# Patient Record
Sex: Male | Born: 1945 | Race: White | Hispanic: No | Marital: Married | Smoking: Never smoker
Health system: Southern US, Community
[De-identification: ages and names within clinical notes are randomized; demographics above are authoritative.]

## PROBLEM LIST (undated history)

## (undated) DIAGNOSIS — I4891 Unspecified atrial fibrillation: Secondary | ICD-10-CM

## (undated) DIAGNOSIS — I10 Essential (primary) hypertension: Secondary | ICD-10-CM

## (undated) DIAGNOSIS — I499 Cardiac arrhythmia, unspecified: Secondary | ICD-10-CM

## (undated) DIAGNOSIS — C801 Malignant (primary) neoplasm, unspecified: Secondary | ICD-10-CM

## (undated) DIAGNOSIS — T8859XA Other complications of anesthesia, initial encounter: Secondary | ICD-10-CM

## (undated) DIAGNOSIS — E785 Hyperlipidemia, unspecified: Secondary | ICD-10-CM

## (undated) DIAGNOSIS — M1990 Unspecified osteoarthritis, unspecified site: Secondary | ICD-10-CM

## (undated) HISTORY — DX: Unspecified osteoarthritis, unspecified site: M19.90

## (undated) HISTORY — DX: Essential (primary) hypertension: I10

## (undated) HISTORY — PX: KNEE ARTHROSCOPY: SHX127

## (undated) HISTORY — PX: HERNIA REPAIR: SHX51

## (undated) HISTORY — DX: Hyperlipidemia, unspecified: E78.5

---

## 2002-06-26 ENCOUNTER — Ambulatory Visit (HOSPITAL_COMMUNITY): Admission: RE | Admit: 2002-06-26 | Discharge: 2002-06-26 | Payer: Self-pay | Admitting: Gastroenterology

## 2003-12-29 ENCOUNTER — Encounter: Admission: RE | Admit: 2003-12-29 | Discharge: 2003-12-29 | Payer: Self-pay | Admitting: General Surgery

## 2004-01-01 ENCOUNTER — Ambulatory Visit (HOSPITAL_COMMUNITY): Admission: RE | Admit: 2004-01-01 | Discharge: 2004-01-01 | Payer: Self-pay | Admitting: General Surgery

## 2004-01-01 ENCOUNTER — Ambulatory Visit (HOSPITAL_BASED_OUTPATIENT_CLINIC_OR_DEPARTMENT_OTHER): Admission: RE | Admit: 2004-01-01 | Discharge: 2004-01-01 | Payer: Self-pay | Admitting: General Surgery

## 2004-09-14 ENCOUNTER — Encounter: Admission: RE | Admit: 2004-09-14 | Discharge: 2004-09-14 | Payer: Self-pay | Admitting: General Surgery

## 2004-10-17 ENCOUNTER — Encounter: Admission: RE | Admit: 2004-10-17 | Discharge: 2004-10-17 | Payer: Self-pay | Admitting: General Surgery

## 2004-10-19 ENCOUNTER — Ambulatory Visit (HOSPITAL_BASED_OUTPATIENT_CLINIC_OR_DEPARTMENT_OTHER): Admission: RE | Admit: 2004-10-19 | Discharge: 2004-10-19 | Payer: Self-pay | Admitting: General Surgery

## 2004-10-19 ENCOUNTER — Ambulatory Visit (HOSPITAL_COMMUNITY): Admission: RE | Admit: 2004-10-19 | Discharge: 2004-10-19 | Payer: Self-pay | Admitting: General Surgery

## 2012-10-29 ENCOUNTER — Other Ambulatory Visit: Payer: Self-pay | Admitting: Gastroenterology

## 2017-07-11 ENCOUNTER — Other Ambulatory Visit: Payer: Self-pay | Admitting: Urology

## 2017-07-11 DIAGNOSIS — C61 Malignant neoplasm of prostate: Secondary | ICD-10-CM

## 2017-07-18 ENCOUNTER — Other Ambulatory Visit: Payer: Self-pay | Admitting: Physician Assistant

## 2017-07-18 DIAGNOSIS — M8580 Other specified disorders of bone density and structure, unspecified site: Secondary | ICD-10-CM

## 2017-07-24 ENCOUNTER — Ambulatory Visit
Admission: RE | Admit: 2017-07-24 | Discharge: 2017-07-24 | Disposition: A | Discharge status: Home / Self Care | Payer: Medicare Other | Source: Ambulatory Visit | Attending: Urology | Admitting: Urology

## 2017-07-24 DIAGNOSIS — C61 Malignant neoplasm of prostate: Secondary | ICD-10-CM

## 2017-07-24 MED ORDER — GADOBENATE DIMEGLUMINE 529 MG/ML IV SOLN
16.0000 mL | Freq: Once | INTRAVENOUS | Status: AC | PRN
  Administered 2017-07-24: 16 mL via INTRAVENOUS

## 2017-08-10 ENCOUNTER — Ambulatory Visit
Admission: RE | Admit: 2017-08-10 | Discharge: 2017-08-10 | Disposition: A | Discharge status: Home / Self Care | Payer: Medicare Other | Source: Ambulatory Visit | Attending: Physician Assistant | Admitting: Physician Assistant

## 2017-08-10 DIAGNOSIS — M8580 Other specified disorders of bone density and structure, unspecified site: Secondary | ICD-10-CM

## 2018-05-29 ENCOUNTER — Ambulatory Visit: Payer: Self-pay | Admitting: Family Medicine

## 2018-05-30 ENCOUNTER — Encounter: Payer: Self-pay | Admitting: Family Medicine

## 2018-05-30 ENCOUNTER — Ambulatory Visit (INDEPENDENT_AMBULATORY_CARE_PROVIDER_SITE_OTHER): Payer: Medicare Other | Admitting: Family Medicine

## 2018-05-30 VITALS — BP 118/74 | HR 78 | Temp 97.4°F | Ht 68.0 in | Wt 177.0 lb

## 2018-05-30 DIAGNOSIS — M810 Age-related osteoporosis without current pathological fracture: Secondary | ICD-10-CM | POA: Insufficient documentation

## 2018-05-30 DIAGNOSIS — M818 Other osteoporosis without current pathological fracture: Secondary | ICD-10-CM

## 2018-05-30 DIAGNOSIS — Z23 Encounter for immunization: Secondary | ICD-10-CM | POA: Diagnosis not present

## 2018-05-30 DIAGNOSIS — B372 Candidiasis of skin and nail: Secondary | ICD-10-CM

## 2018-05-30 DIAGNOSIS — E785 Hyperlipidemia, unspecified: Secondary | ICD-10-CM | POA: Insufficient documentation

## 2018-05-30 DIAGNOSIS — E782 Mixed hyperlipidemia: Secondary | ICD-10-CM | POA: Diagnosis not present

## 2018-05-30 DIAGNOSIS — R972 Elevated prostate specific antigen [PSA]: Secondary | ICD-10-CM

## 2018-05-30 DIAGNOSIS — M353 Polymyalgia rheumatica: Secondary | ICD-10-CM | POA: Diagnosis not present

## 2018-05-30 DIAGNOSIS — I10 Essential (primary) hypertension: Secondary | ICD-10-CM

## 2018-05-30 LAB — CMP14+EGFR
ALK PHOS: 42 IU/L (ref 39–117)
ALT: 10 IU/L (ref 0–44)
AST: 16 IU/L (ref 0–40)
Albumin/Globulin Ratio: 2 (ref 1.2–2.2)
Albumin: 4.4 g/dL (ref 3.5–4.8)
BUN/Creatinine Ratio: 25 — ABNORMAL HIGH (ref 10–24)
BUN: 20 mg/dL (ref 8–27)
Bilirubin Total: 0.6 mg/dL (ref 0.0–1.2)
CALCIUM: 9.4 mg/dL (ref 8.6–10.2)
CHLORIDE: 100 mmol/L (ref 96–106)
CO2: 25 mmol/L (ref 20–29)
CREATININE: 0.8 mg/dL (ref 0.76–1.27)
GFR calc Af Amer: 103 mL/min/{1.73_m2} (ref >59)
GFR calc non Af Amer: 89 mL/min/{1.73_m2} (ref >59)
Globulin, Total: 2.2 g/dL (ref 1.5–4.5)
Glucose: 85 mg/dL (ref 65–99)
Potassium: 4 mmol/L (ref 3.5–5.2)
Sodium: 141 mmol/L (ref 134–144)
TOTAL PROTEIN: 6.6 g/dL (ref 6.0–8.5)

## 2018-05-30 LAB — CBC WITH DIFFERENTIAL/PLATELET
Basophils Absolute: 0 10*3/uL (ref 0.0–0.2)
Basos: 0 %
EOS (ABSOLUTE): 0.2 10*3/uL (ref 0.0–0.4)
EOS: 4 %
HEMATOCRIT: 40.7 % (ref 37.5–51.0)
HEMOGLOBIN: 14.7 g/dL (ref 13.0–17.7)
Immature Grans (Abs): 0 10*3/uL (ref 0.0–0.1)
Immature Granulocytes: 0 %
Lymphocytes Absolute: 1.8 10*3/uL (ref 0.7–3.1)
Lymphs: 28 %
MCH: 32.5 pg (ref 26.6–33.0)
MCHC: 36.1 g/dL — ABNORMAL HIGH (ref 31.5–35.7)
MCV: 90 fL (ref 79–97)
MONOS ABS: 0.7 10*3/uL (ref 0.1–0.9)
Monocytes: 10 %
NEUTROS ABS: 3.7 10*3/uL (ref 1.4–7.0)
Neutrophils: 58 %
Platelets: 243 10*3/uL (ref 150–450)
RBC: 4.52 x10E6/uL (ref 4.14–5.80)
RDW: 12.7 % (ref 12.3–15.4)
WBC: 6.4 10*3/uL (ref 3.4–10.8)

## 2018-05-30 LAB — LIPID PANEL
CHOLESTEROL TOTAL: 164 mg/dL (ref 100–199)
Chol/HDL Ratio: 3.2 ratio (ref 0.0–5.0)
HDL: 52 mg/dL (ref >39)
LDL CALC: 86 mg/dL (ref 0–99)
TRIGLYCERIDES: 130 mg/dL (ref 0–149)
VLDL CHOLESTEROL CAL: 26 mg/dL (ref 5–40)

## 2018-05-30 MED ORDER — TERBINAFINE HCL 1 % EX CREA: 1.0000 "application " | TOPICAL_CREAM | Freq: Two times a day (BID) | CUTANEOUS | 0 refills | Status: DC

## 2018-05-30 MED ORDER — FLUCONAZOLE 150 MG PO TABS: 150.0000 mg | ORAL_TABLET | Freq: Once | ORAL | 0 refills | Status: AC

## 2018-05-30 NOTE — Progress Notes (Signed)
BP 118/74   Pulse 78   Temp (!) 97.4 F (36.3 C) (Oral)   Ht _0  (1.727 m)   Wt 177 lb (80.3 kg)   BMI 26.91 kg/m    Subjective:    Patient ID: David Schwartz, male    DOB: 11/24/1945, 72 y.o.   MRN: [id number]  HPI: David Schwartz is a 72 y.o. male presenting on 05/30/2018 for New Patient (Initial Visit) [facility] in [place]); Establish Care; and Rash (Groin - x 1-2  months )   HPI Hypertension Patient is currently on Benicar, and their blood pressure today is 118/74. Patient denies any lightheadedness or dizziness. Patient denies headaches, blurred vision, chest pains, shortness of breath, or weakness. Denies any side effects from medication and is content with current medication.   Hyperlipidemia Patient is coming in for recheck of his hyperlipidemia. The patient is currently taking Crestor. They deny any issues with myalgias or history of liver damage from it. They deny any focal numbness or weakness or chest pain.   Rash in groin Patient is coming in with complaints of a rash in his groin mainly on the left side of his groin near his left testicle that has been irritating.  He has been using ketoconazole cream on it and it seemed to help a little bit but will not clear it.  Still has been hot and humid from the summer.  He says is very pruritic but not painful.  He denies any drainage or redness or warmth.  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  He is currently taking Fosamax for osteoporosis.  Patient also has enlarged prostate and elevated PSA which he has been seeing a urologist for at [facility] and they have been monitoring it and have been doing biopsies as needed.  Relevant past medical, surgical, family and social history reviewed and updated as indicated. Interim  medical history since our last visit reviewed. Allergies and medications reviewed and updated.  Review of Systems  Constitutional: Negative for chills and fever.  Eyes: Negative for visual disturbance.  Respiratory: Negative for shortness of breath and wheezing.   Cardiovascular: Negative for chest pain and leg swelling.  Genitourinary: Negative for difficulty urinating.  Musculoskeletal: Positive for arthralgias. Negative for back pain and gait problem.  Skin: Positive for rash.  Neurological: Negative for dizziness, weakness and light-headedness.  All other systems reviewed and are negative.   Per HPI unless specifically indicated above  Social History   Socioeconomic History  . Marital status: Single    Spouse name: Not on file  . Number of children: Not on file  . Years of education: Not on file  . Highest education level: Not on file  Occupational History  . Not on file  Social Needs  . Financial resource strain: Not on file  . Food insecurity:    Worry: Not on file    Inability: Not on file  . Transportation needs:    Medical: Not on file    Non-medical: Not on file  Tobacco Use  . Smoking status: Never Smoker  . Smokeless tobacco: Never Used  Substance and Sexual Activity  . Alcohol use: Yes    Comment: occ  . Drug use: Never  . Sexual activity: Not on file  Lifestyle  .  Physical activity:    Days per week: Not on file    Minutes per session: Not on file  . Stress: Not on file  Relationships  . Social connections:    Talks on phone: Not on file    Gets together: Not on file    Attends religious service: Not on file    Active member of club or organization: Not on file    Attends meetings of clubs or organizations: Not on file    Relationship status: Not on file  . Intimate partner violence:    Fear of current or ex partner: Not on file    Emotionally abused: Not on file    Physically abused: Not on file    Forced sexual activity: Not on file  Other  Topics Concern  . Not on file  Social History Narrative  . Not on file    Past Surgical History:  Procedure Laterality Date  . HERNIA REPAIR    . KNEE ARTHROSCOPY      Family History  Problem Relation Age of Onset  . Diabetes Mother   . Hypertension Mother   . Early death Father   . Arthritis Maternal Grandmother   . Hypertension Maternal Grandfather   . Hypertension Paternal Grandmother   . Cancer Paternal Grandfather        lung    Allergies as of 05/30/2018   No Known Allergies     Medication List        Accurate as of 05/30/18 10:18 AM. Always use your most recent med list.          alendronate 70 MG tablet Commonly known as:  FOSAMAX Take 1 tablet by mouth once a week.   aspirin EC 81 MG tablet Take 81 mg by mouth daily.   calcium carbonate 1500 (600 Ca) MG Tabs tablet Commonly known as:  OSCAL Take by mouth daily with breakfast.   fluconazole 150 MG tablet Commonly known as:  DIFLUCAN Take 1 tablet (150 mg total) by mouth once for 1 dose.   olmesartan-hydrochlorothiazide 20-12.5 MG tablet Commonly known as:  BENICAR HCT Take 1 tablet by mouth daily at 2 PM.   predniSONE 1 MG tablet Commonly known as:  DELTASONE Take 2 mg by mouth daily with breakfast.   rosuvastatin 5 MG tablet Commonly known as:  CRESTOR Take 1 tablet by mouth daily at 2 PM.   terazosin 2 MG capsule Commonly known as:  HYTRIN Take 1 capsule by mouth daily at 2 PM.   terbinafine 1 % cream Commonly known as:  LAMISIL Apply 1 application topically 2 (two) times daily.   Vitamin D3 1000 units Caps Take by mouth.          Objective:    BP 118/74   Pulse 78   Temp (!) 97.4 F (36.3 C) (Oral)   Ht _0  (1.727 m)   Wt 177 lb (80.3 kg)   BMI 26.91 kg/m   Wt Readings from Last 3 Encounters:  05/30/18 177 lb (80.3 kg)    Physical Exam  Constitutional: He is oriented to person, place, and time. He appears well-developed and well-nourished. No distress.  Eyes:  Pupils are equal, round, and reactive to light. Conjunctivae and EOM are normal. No scleral icterus.  Neck: Neck supple. No thyromegaly present.  Cardiovascular: Normal rate, regular rhythm, normal heart sounds and intact distal pulses.  No murmur heard. Pulmonary/Chest: Effort normal and breath sounds normal. No respiratory distress. He has no  wheezes.  Musculoskeletal: Normal range of motion. He exhibits no edema.  Lymphadenopathy:    He has no cervical adenopathy.  Neurological: He is alert and oriented to person, place, and time. Coordination normal.  Skin: Skin is warm and dry. Rash (Yeast dermatitis of left groin) noted. He is not diaphoretic.  Psychiatric: He has a normal mood and affect. His behavior is normal.  Nursing note and vitals reviewed.   No results found for this or any previous visit.    Assessment & Plan:   Problem List Items Addressed This Visit      Cardiovascular and Mediastinum   Hypertension - Primary   Relevant Medications   olmesartan-hydrochlorothiazide (BENICAR HCT) 20-12.5 MG tablet   terazosin (HYTRIN) 2 MG capsule   rosuvastatin (CRESTOR) 5 MG tablet   aspirin EC 81 MG tablet   Other Relevant Orders   CMP14+EGFR     Musculoskeletal and Integument   Osteoporosis   Relevant Medications   alendronate (FOSAMAX) 70 MG tablet   calcium carbonate (OSCAL) 1500 (600 Ca) MG TABS tablet   Cholecalciferol (VITAMIN D3) 1000 units CAPS   Other Relevant Orders   CBC with Differential/Platelet     Other   Hyperlipemia   Relevant Medications   olmesartan-hydrochlorothiazide (BENICAR HCT) 20-12.5 MG tablet   terazosin (HYTRIN) 2 MG capsule   rosuvastatin (CRESTOR) 5 MG tablet   aspirin EC 81 MG tablet   Other Relevant Orders   Lipid panel   PMR (polymyalgia rheumatica) (HCC)   Relevant Orders   CBC with Differential/Platelet   Elevated PSA measurement    Other Visit Diagnoses    Yeast dermatitis       Relevant Medications   fluconazole (DIFLUCAN)  150 MG tablet   terbinafine (LAMISIL AT) 1 % cream       Follow up plan: Return in about 1 year (around 05/31/2019), or if symptoms worsen or fail to improve, for htn and hld.  Caryl Pina, MD [facility] 05/30/2018, 10:18 AM

## 2018-06-13 ENCOUNTER — Ambulatory Visit: Payer: Medicare Other | Admitting: Family Medicine

## 2018-06-13 ENCOUNTER — Encounter: Payer: Self-pay | Admitting: Family Medicine

## 2018-06-13 VITALS — BP 123/79 | HR 79 | Temp 97.4°F | Ht 68.0 in | Wt 179.2 lb

## 2018-06-13 DIAGNOSIS — B372 Candidiasis of skin and nail: Secondary | ICD-10-CM | POA: Diagnosis not present

## 2018-06-13 MED ORDER — FLUCONAZOLE 150 MG PO TABS: 150.0000 mg | ORAL_TABLET | ORAL | 0 refills | Status: DC

## 2018-06-13 MED ORDER — MICONAZOLE NITRATE 2 % EX POWD
CUTANEOUS | 0 refills | Status: DC | PRN
Start: 2018-06-13 — End: 2018-12-09

## 2018-06-13 NOTE — Progress Notes (Signed)
BP 123/79   Pulse 79   Temp (!) 97.4 F (36.3 C) (Oral)   Ht 5\' 8"  (1.727 m)   Wt 179 lb 3.2 oz (81.3 kg)   BMI 27.25 kg/m    Subjective:    Patient ID: David Schwartz, male    DOB: 1946-01-02, 72 y.o.   MRN: [id number]  HPI: David Schwartz is a 72 y.o. male presenting on 06/13/2018 for Rash (Groin- Patient states it has been there a few months - Seen 9/26 and no better)   HPI Rash in groin Patient comes in complaining of rash in groin that has been bothering him for the past few weeks.  He says that he did feel like it did improve a little bit with the Diflucan and then he has been using the jock itch cream which is helped some but it does not seem to be resolving completely  Relevant past medical, surgical, family and social history reviewed and updated as indicated. Interim medical history since our last visit reviewed. Allergies and medications reviewed and updated.  Review of Systems  Constitutional: Negative for chills and fever.  Respiratory: Negative for shortness of breath and wheezing.   Cardiovascular: Negative for chest pain and leg swelling.  Musculoskeletal: Negative for back pain and gait problem.  Skin: Positive for rash.  All other systems reviewed and are negative.   Per HPI unless specifically indicated above   Allergies as of 06/13/2018   No Known Allergies     Medication List        Accurate as of 06/13/18  8:48 AM. Always use your most recent med list.          alendronate 70 MG tablet Commonly known as:  FOSAMAX Take 1 tablet by mouth once a week.   aspirin EC 81 MG tablet Take 81 mg by mouth daily.   calcium carbonate 1500 (600 Ca) MG Tabs tablet Commonly known as:  OSCAL Take by mouth daily with breakfast.   fluconazole 150 MG tablet Commonly known as:  DIFLUCAN Take 1 tablet (150 mg total) by mouth every 3 (three) days. 1 po q week x 4 weeks   miconazole 2 % powder Commonly known as:  MICOTIN Apply topically as needed  for itching.   olmesartan-hydrochlorothiazide 20-12.5 MG tablet Commonly known as:  BENICAR HCT Take 1 tablet by mouth daily at 2 PM.   predniSONE 1 MG tablet Commonly known as:  DELTASONE Take 2 mg by mouth daily with breakfast.   rosuvastatin 5 MG tablet Commonly known as:  CRESTOR Take 1 tablet by mouth daily at 2 PM.   terazosin 2 MG capsule Commonly known as:  HYTRIN Take 1 capsule by mouth daily at 2 PM.   terbinafine 1 % cream Commonly known as:  LAMISIL Apply 1 application topically 2 (two) times daily.   Vitamin D3 1000 units Caps Take by mouth.          Objective:    BP 123/79   Pulse 79   Temp (!) 97.4 F (36.3 C) (Oral)   Ht 5\' 8"  (1.727 m)   Wt 179 lb 3.2 oz (81.3 kg)   BMI 27.25 kg/m   Wt Readings from Last 3 Encounters:  06/13/18 179 lb 3.2 oz (81.3 kg)  05/30/18 177 lb (80.3 kg)    Physical Exam  Constitutional: He is oriented to person, place, and time. He appears well-developed and well-nourished. No distress.  Eyes: Conjunctivae are normal. No scleral icterus.  Cardiovascular: Normal rate, regular rhythm, normal heart sounds and intact distal pulses.  No murmur heard. Pulmonary/Chest: Effort normal and breath sounds normal. No respiratory distress. He has no wheezes.  Neurological: He is alert and oriented to person, place, and time. Coordination normal.  Skin: Skin is warm and dry. Rash (Pink irritated rash with satellite lesions in left groin) noted. He is not diaphoretic.  Psychiatric: He has a normal mood and affect. His behavior is normal.  Nursing note and vitals reviewed.       Assessment & Plan:   Problem List Items Addressed This Visit    None    Visit Diagnoses    Yeast dermatitis    -  Primary   Relevant Medications   miconazole (LOTRIMIN AF) 2 % powder   fluconazole (DIFLUCAN) 150 MG tablet       Follow up plan: Return if symptoms worsen or fail to improve.  Counseling provided for all of the vaccine  components No orders of the defined types were placed in this encounter.   Caryl Pina, MD [facility] 06/13/2018, 8:48 AM

## 2018-06-14 ENCOUNTER — Telehealth: Payer: Self-pay | Admitting: *Deleted

## 2018-06-14 DIAGNOSIS — B372 Candidiasis of skin and nail: Secondary | ICD-10-CM

## 2018-06-14 NOTE — Telephone Encounter (Signed)
Fax from [facility] received with 2 sets of directions Please clarify and resend

## 2018-06-14 NOTE — Telephone Encounter (Signed)
We fixed it, received a call from them.

## 2018-06-25 ENCOUNTER — Telehealth: Payer: Self-pay

## 2018-06-25 DIAGNOSIS — R21 Rash and other nonspecific skin eruption: Secondary | ICD-10-CM

## 2018-06-25 NOTE — Telephone Encounter (Signed)
Wants a referral to Dr Tarri Glenn for a rash

## 2018-06-26 NOTE — Telephone Encounter (Signed)
Go ahead and do referral to Dr. Tarri Glenn for rash

## 2018-06-26 NOTE — Addendum Note (Signed)
Addended by: Nigel Berthold C on: 06/26/2018 11:04 AM   Modules accepted: Orders

## 2018-06-26 NOTE — Telephone Encounter (Signed)
Referral placed - wife aware.

## 2018-07-02 DIAGNOSIS — L280 Lichen simplex chronicus: Secondary | ICD-10-CM | POA: Diagnosis not present

## 2018-07-12 DIAGNOSIS — Z7952 Long term (current) use of systemic steroids: Secondary | ICD-10-CM | POA: Diagnosis not present

## 2018-07-12 DIAGNOSIS — M130 Polyarthritis, unspecified: Secondary | ICD-10-CM | POA: Diagnosis not present

## 2018-07-12 DIAGNOSIS — M353 Polymyalgia rheumatica: Secondary | ICD-10-CM | POA: Diagnosis not present

## 2018-07-12 DIAGNOSIS — M150 Primary generalized (osteo)arthritis: Secondary | ICD-10-CM | POA: Diagnosis not present

## 2018-07-23 DIAGNOSIS — L280 Lichen simplex chronicus: Secondary | ICD-10-CM | POA: Diagnosis not present

## 2018-07-24 ENCOUNTER — Ambulatory Visit: Payer: Medicare Other | Admitting: Urology

## 2018-07-24 DIAGNOSIS — N401 Enlarged prostate with lower urinary tract symptoms: Secondary | ICD-10-CM

## 2018-07-24 DIAGNOSIS — C61 Malignant neoplasm of prostate: Secondary | ICD-10-CM

## 2018-09-11 DIAGNOSIS — Z7952 Long term (current) use of systemic steroids: Secondary | ICD-10-CM | POA: Diagnosis not present

## 2018-09-11 DIAGNOSIS — M353 Polymyalgia rheumatica: Secondary | ICD-10-CM | POA: Diagnosis not present

## 2018-09-11 DIAGNOSIS — M130 Polyarthritis, unspecified: Secondary | ICD-10-CM | POA: Diagnosis not present

## 2018-09-11 DIAGNOSIS — M150 Primary generalized (osteo)arthritis: Secondary | ICD-10-CM | POA: Diagnosis not present

## 2018-09-26 ENCOUNTER — Other Ambulatory Visit: Payer: Self-pay

## 2018-09-26 MED ORDER — OLMESARTAN MEDOXOMIL-HCTZ 20-12.5 MG PO TABS: 1.0000 | ORAL_TABLET | Freq: Every day | ORAL | 1 refills | Status: DC

## 2018-09-30 ENCOUNTER — Telehealth: Payer: Self-pay

## 2018-09-30 NOTE — Telephone Encounter (Signed)
error 

## 2018-11-25 ENCOUNTER — Other Ambulatory Visit: Payer: Self-pay | Admitting: *Deleted

## 2018-11-25 MED ORDER — TERAZOSIN HCL 2 MG PO CAPS: 2.0000 mg | ORAL_CAPSULE | Freq: Every day | ORAL | 2 refills | Status: DC

## 2018-12-09 ENCOUNTER — Ambulatory Visit (INDEPENDENT_AMBULATORY_CARE_PROVIDER_SITE_OTHER): Payer: Medicare Other | Admitting: Family Medicine

## 2018-12-09 ENCOUNTER — Other Ambulatory Visit: Payer: Self-pay

## 2018-12-09 ENCOUNTER — Encounter: Payer: Self-pay | Admitting: Family Medicine

## 2018-12-09 DIAGNOSIS — I10 Essential (primary) hypertension: Secondary | ICD-10-CM | POA: Diagnosis not present

## 2018-12-09 DIAGNOSIS — E782 Mixed hyperlipidemia: Secondary | ICD-10-CM

## 2018-12-09 MED ORDER — OLMESARTAN MEDOXOMIL-HCTZ 20-12.5 MG PO TABS: 1.0000 | ORAL_TABLET | Freq: Every day | ORAL | 3 refills | Status: DC

## 2018-12-09 MED ORDER — TERAZOSIN HCL 2 MG PO CAPS: 2.0000 mg | ORAL_CAPSULE | Freq: Every day | ORAL | 3 refills | Status: DC

## 2018-12-09 MED ORDER — ROSUVASTATIN CALCIUM 5 MG PO TABS: 5.0000 mg | ORAL_TABLET | Freq: Every day | ORAL | 3 refills | Status: DC

## 2018-12-09 NOTE — Progress Notes (Signed)
Virtual Visit via telephone Note  I connected with David Schwartz on 12/09/18 at 1440 by telephone and verified that I am speaking with the correct person using two identifiers. David Schwartz is currently located at home and no other people are currently with her during visit. The provider, Fransisca Kaufmann Bee Hammerschmidt, MD is located in their office at time of visit.  Call ended at 1450  I discussed the limitations, risks, security and privacy concerns of performing an evaluation and management service by telephone and the availability of in person appointments. I also discussed with the patient that there may be a patient responsible charge related to this service. The patient expressed understanding and agreed to proceed.   History and Present Illness: Hypertension Patient is currently on benicar hct, and their blood pressure today is 120/70s at home. Patient denies any lightheadedness or dizziness. Patient denies headaches, blurred vision, chest pains, shortness of breath, or weakness. Denies any side effects from medication and is content with current medication.   Hyperlipidemia Patient is coming in for recheck of his hyperlipidemia. The patient is currently taking crestor. They deny any issues with myalgias or history of liver damage from it. They deny any focal numbness or weakness or chest pain.   No diagnosis found.  Outpatient Encounter Medications as of 12/09/2018  Medication Sig  . alendronate (FOSAMAX) 70 MG tablet Take 1 tablet by mouth once a week.  Marland Kitchen aspirin EC 81 MG tablet Take 81 mg by mouth daily.  . calcium carbonate (OSCAL) 1500 (600 Ca) MG TABS tablet Take by mouth daily with breakfast.  . Cholecalciferol (VITAMIN D3) 1000 units CAPS Take by mouth.  . fluconazole (DIFLUCAN) 150 MG tablet Take 1 tablet (150 mg total) by mouth every 3 (three) days. 1 po q week x 4 weeks  . miconazole (LOTRIMIN AF) 2 % powder Apply topically as needed for itching.  .  olmesartan-hydrochlorothiazide (BENICAR HCT) 20-12.5 MG tablet Take 1 tablet by mouth daily at 2 PM.  . predniSONE (DELTASONE) 1 MG tablet Take 2 mg by mouth daily with breakfast.  . rosuvastatin (CRESTOR) 5 MG tablet Take 1 tablet by mouth daily at 2 PM.  . terazosin (HYTRIN) 2 MG capsule Take 1 capsule (2 mg total) by mouth daily at 2 PM.  . terbinafine (LAMISIL AT) 1 % cream Apply 1 application topically 2 (two) times daily.   No facility-administered encounter medications on file as of 12/09/2018.     Review of Systems  Constitutional: Negative for chills and fever.  Eyes: Negative for visual disturbance.  Respiratory: Negative for shortness of breath and wheezing.   Cardiovascular: Negative for chest pain and leg swelling.  Musculoskeletal: Negative for back pain and gait problem.  Skin: Negative for rash.  Neurological: Negative for dizziness, weakness, light-headedness and headaches.  All other systems reviewed and are negative.   Observations/Objective: Patient sounds comfortable on the phone and in no acute distress  Assessment and Plan: Problem List Items Addressed This Visit      Cardiovascular and Mediastinum   Hypertension - Primary   Relevant Medications   olmesartan-hydrochlorothiazide (BENICAR HCT) 20-12.5 MG tablet   terazosin (HYTRIN) 2 MG capsule   rosuvastatin (CRESTOR) 5 MG tablet     Other   Hyperlipemia   Relevant Medications   olmesartan-hydrochlorothiazide (BENICAR HCT) 20-12.5 MG tablet   terazosin (HYTRIN) 2 MG capsule   rosuvastatin (CRESTOR) 5 MG tablet       Follow Up Instructions:  Follow up  in 6 months for labs and htn, he says home blood pressures are running very well   I discussed the assessment and treatment plan with the patient. The patient was provided an opportunity to ask questions and all were answered. The patient agreed with the plan and demonstrated an understanding of the instructions.   The patient was advised to call back  or seek an in-person evaluation if the symptoms worsen or if the condition fails to improve as anticipated.  The above assessment and management plan was discussed with the patient. The patient verbalized understanding of and has agreed to the management plan. Patient is aware to call the clinic if symptoms persist or worsen. Patient is aware when to return to the clinic for a follow-up visit. Patient educated on when it is appropriate to go to the emergency department.    I provided 10 minutes of non-face-to-face time during this encounter.    Worthy Rancher, MD

## 2019-01-07 ENCOUNTER — Telehealth: Payer: Self-pay | Admitting: Family Medicine

## 2019-01-07 MED ORDER — ALENDRONATE SODIUM 70 MG PO TABS: 70.0000 mg | ORAL_TABLET | ORAL | 3 refills | Status: DC

## 2019-01-07 NOTE — Telephone Encounter (Signed)
Med sent in / aware

## 2019-01-07 NOTE — Telephone Encounter (Signed)
What is the name of the medication? Alendronate 70MG  was prescribed by rheumatologist but has discussed this with Dr. Keturah Barre and was told he would refill it   Have you contacted your pharmacy to request a refill? yes  Which pharmacy would you like this sent to? [facility]   Patient notified that their request is being sent to the clinical staff for review and that they should receive a call once it is complete. If they do not receive a call within 24 hours they can check with their pharmacy or our office.

## 2019-02-05 ENCOUNTER — Ambulatory Visit (INDEPENDENT_AMBULATORY_CARE_PROVIDER_SITE_OTHER): Payer: Medicare Other | Admitting: Urology

## 2019-02-05 DIAGNOSIS — N401 Enlarged prostate with lower urinary tract symptoms: Secondary | ICD-10-CM

## 2019-02-05 DIAGNOSIS — N5201 Erectile dysfunction due to arterial insufficiency: Secondary | ICD-10-CM

## 2019-02-05 DIAGNOSIS — C61 Malignant neoplasm of prostate: Secondary | ICD-10-CM | POA: Diagnosis not present

## 2019-02-25 IMAGING — MR MR PROSTATE WO/W CM
56 series · 56 of 56 positions shown · IV contrast (multihance)
Comparison: None.

CLINICAL DATA: Prostate cancer, elevated PSA

EXAM:
MR PROSTATE WITHOUT AND WITH CONTRAST
TECHNIQUE: Multiplanar multisequence MRI images were obtained of the pelvis
centered about the prostate. Pre and post contrast images were
obtained.
CONTRAST:  16mL MULTIHANCE GADOBENATE DIMEGLUMINE 529 MG/ML IV SOLN
Creatinine was obtained on site at [HOSPITAL] at [HOSPITAL].
Results: Creatinine 0.7 mg/dL.

[Series 4: T1 · axial · 8.0mm · 1.06mm/px · 1 of 28 slices shown (1 of 2)]
[im 1/28]
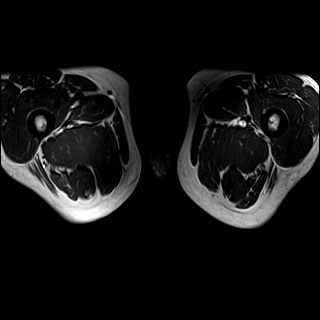

[Series 5: bSSFP fat-sat · axial · 8.0mm · 0.74mm/px · 1 of 28 slices shown]
[im 1/28]
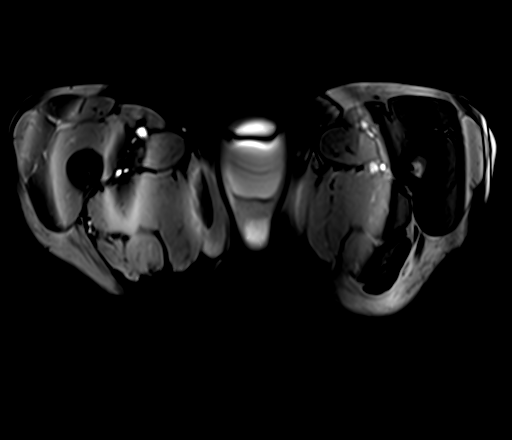

[Series 6: T2 · sagittal · 3.5mm · 0.56mm/px · 1 of 39 slices shown (1 of 4)]
[im 1/39]
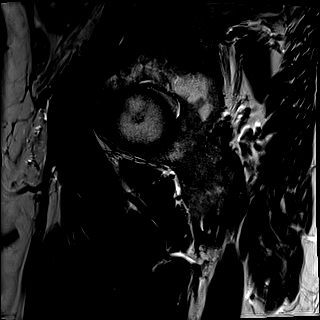

[Series 7: T1 · axial · 3.0mm · 0.31mm/px · 1 of 24 slices shown (2 of 2)]
[im 1/24]
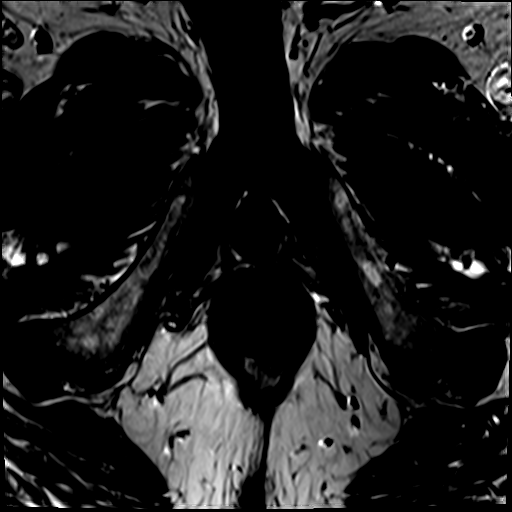

[Series 8: T2 · axial · 3.5mm · 0.56mm/px · 1 of 23 slices shown (2 of 4)]
[im 1/23]
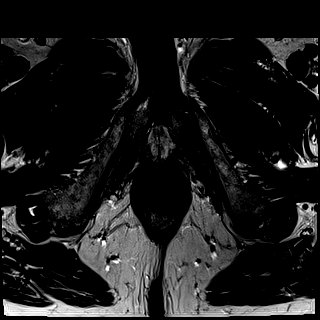

[Series 9: T2 · axial · 1.0mm · 1.04mm/px · 1 of 80 slices shown (3 of 4)]
[im 1/80]
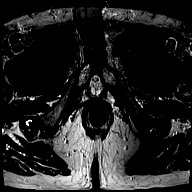

[Series 10: T2 · coronal · 3.5mm · 0.56mm/px · 1 of 23 slices shown (4 of 4)]
[im 1/23]
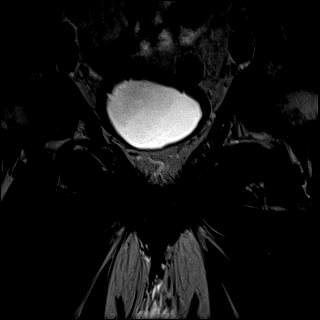

[Series 11: DWI · axial · 3.5mm · 1.56mm/px · 1 of 60 slices shown (1 of 2)]
[im 1/60]
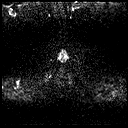

[Series 12: DWI · axial · 3.5mm · 1.56mm/px · 1 of 19 slices shown (2 of 2)]
[im 1/19]
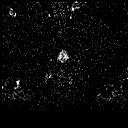

[Series 13: pre t1_twist_tra_dyn_ttc=5.3s · axial · non-contrast · 3.5mm · 0.83mm/px · 1 of 20 slices shown]
[im 1/20]
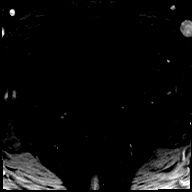

[Series 14: post t1_twist_tra_dyn-copy center · axial · 3.5mm · 0.83mm/px · 1 of 20 slices shown (1 of 24)]
[im 1/20]
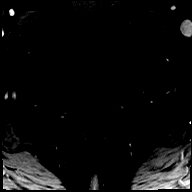

[Series 15: post t1_twist_tra_dyn-copy center · axial · 3.5mm · 0.83mm/px · 1 of 20 slices shown (2 of 24)]
[im 1/20]
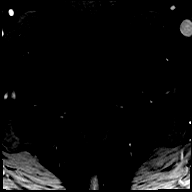

[Series 16: post t1_twist_tra_dyn-copy cent_sub_ttc=23.0s · axial · 3.5mm · 0.83mm/px · 1 of 20 slices shown]
[im 1/20]
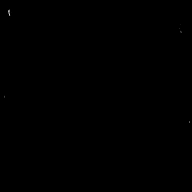

[Series 17: post t1_twist_tra_dyn-copy center · axial · 3.5mm · 0.83mm/px · 1 of 20 slices shown (3 of 24)]
[im 1/20]
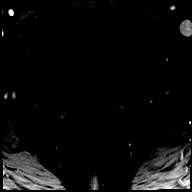

[Series 18: post t1_twist_tra_dyn-copy cent_sub_ttc=33.2s · axial · 3.5mm · 0.83mm/px · 1 of 20 slices shown]
[im 1/20]
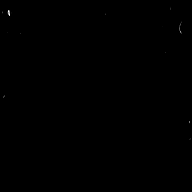

[Series 19: post t1_twist_tra_dyn-copy center · axial · 3.5mm · 0.83mm/px · 1 of 20 slices shown (4 of 24)]
[im 1/20]
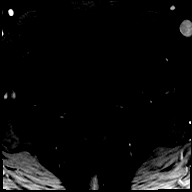

[Series 20: post t1_twist_tra_dyn-copy cent_sub_ttc=43.4s · axial · 3.5mm · 0.83mm/px · 1 of 20 slices shown]
[im 1/20]
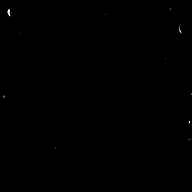

[Series 21: post t1_twist_tra_dyn-copy center · axial · 3.5mm · 0.83mm/px · 1 of 20 slices shown (5 of 24)]
[im 1/20]
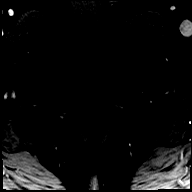

[Series 22: post t1_twist_tra_dyn-copy cent_sub_ttc=53.6s · axial · 3.5mm · 0.83mm/px · 1 of 20 slices shown]
[im 1/20]
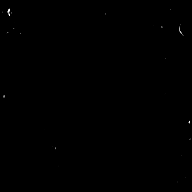

[Series 23: post t1_twist_tra_dyn-copy center · axial · 3.5mm · 0.83mm/px · 1 of 20 slices shown (6 of 24)]
[im 1/20]
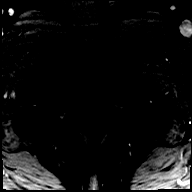

[Series 24: post t1_twist_tra_dyn-copy cent_sub_ttc=63.8s · axial · 3.5mm · 0.83mm/px · 1 of 20 slices shown]
[im 1/20]
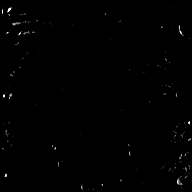

[Series 25: post t1_twist_tra_dyn-copy center · axial · 3.5mm · 0.83mm/px · 1 of 20 slices shown (7 of 24)]
[im 1/20]
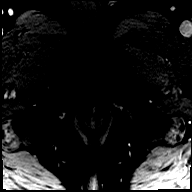

[Series 26: post t1_twist_tra_dyn-copy cent_sub_ttc=74.0s · axial · 3.5mm · 0.83mm/px · 1 of 20 slices shown]
[im 1/20]
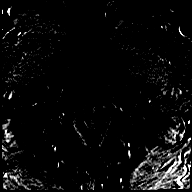

[Series 27: post t1_twist_tra_dyn-copy center · axial · 3.5mm · 0.83mm/px · 1 of 20 slices shown (8 of 24)]
[im 1/20]
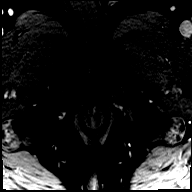

[Series 28: post t1_twist_tra_dyn-copy cent_sub_ttc=84.2s · axial · 3.5mm · 0.83mm/px · 1 of 20 slices shown]
[im 1/20]
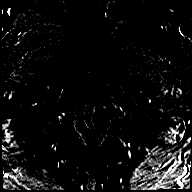

[Series 29: post t1_twist_tra_dyn-copy center · axial · 3.5mm · 0.83mm/px · 1 of 20 slices shown (9 of 24)]
[im 1/20]
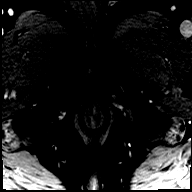

[Series 30: post t1_twist_tra_dyn-copy cent_sub_ttc=94.4s · axial · 3.5mm · 0.83mm/px · 1 of 20 slices shown]
[im 1/20]
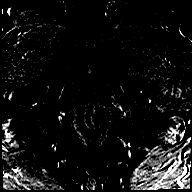

[Series 31: post t1_twist_tra_dyn-copy center · axial · 3.5mm · 0.83mm/px · 1 of 20 slices shown (10 of 24)]
[im 1/20]
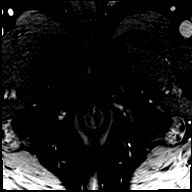

[Series 32: post t1_twist_tra_dyn-copy cent_sub_ttc=104.7s · axial · 3.5mm · 0.83mm/px · 1 of 20 slices shown]
[im 1/20]
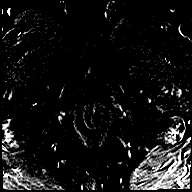

[Series 33: post t1_twist_tra_dyn-copy center · axial · 3.5mm · 0.83mm/px · 1 of 20 slices shown (11 of 24)]
[im 1/20]
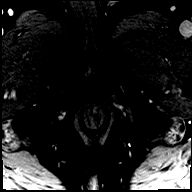

[Series 34: post t1_twist_tra_dyn-copy cent_sub_ttc=114.9s · axial · 3.5mm · 0.83mm/px · 1 of 20 slices shown]
[im 1/20]
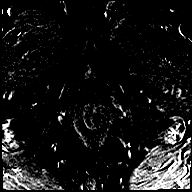

[Series 35: post t1_twist_tra_dyn-copy center · axial · 3.5mm · 0.83mm/px · 1 of 20 slices shown (12 of 24)]
[im 1/20]
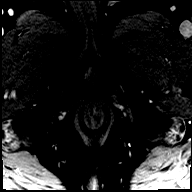

[Series 36: post t1_twist_tra_dyn-copy cent_sub_ttc=125.1s · axial · 3.5mm · 0.83mm/px · 1 of 20 slices shown]
[im 1/20]
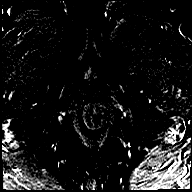

[Series 37: post t1_twist_tra_dyn-copy center · axial · 3.5mm · 0.83mm/px · 1 of 20 slices shown (13 of 24)]
[im 1/20]
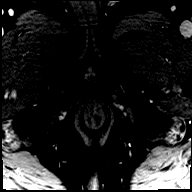

[Series 38: post t1_twist_tra_dyn-copy cent_sub_ttc=135.3s · axial · 3.5mm · 0.83mm/px · 1 of 20 slices shown]
[im 1/20]
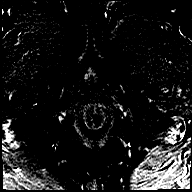

[Series 39: post t1_twist_tra_dyn-copy center · axial · 3.5mm · 0.83mm/px · 1 of 20 slices shown (14 of 24)]
[im 1/20]
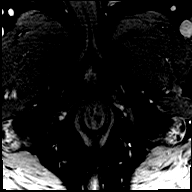

[Series 40: post t1_twist_tra_dyn-copy cent_sub_ttc=145.5s · axial · 3.5mm · 0.83mm/px · 1 of 20 slices shown]
[im 1/20]
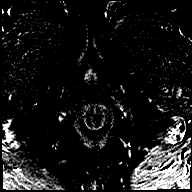

[Series 41: post t1_twist_tra_dyn-copy center · axial · 3.5mm · 0.83mm/px · 1 of 20 slices shown (15 of 24)]
[im 1/20]
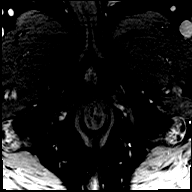

[Series 42: post t1_twist_tra_dyn-copy cent_sub_ttc=155.7s · axial · 3.5mm · 0.83mm/px · 1 of 20 slices shown]
[im 1/20]
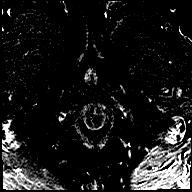

[Series 43: post t1_twist_tra_dyn-copy center · axial · 3.5mm · 0.83mm/px · 1 of 20 slices shown (16 of 24)]
[im 1/20]
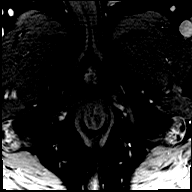

[Series 44: post t1_twist_tra_dyn-copy cent_sub_ttc=165.9s · axial · 3.5mm · 0.83mm/px · 1 of 20 slices shown]
[im 1/20]
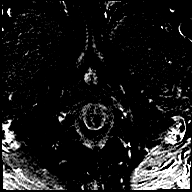

[Series 45: post t1_twist_tra_dyn-copy center · axial · 3.5mm · 0.83mm/px · 1 of 20 slices shown (17 of 24)]
[im 1/20]
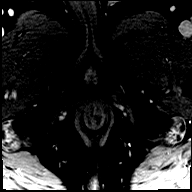

[Series 46: post t1_twist_tra_dyn-copy cent_sub_ttc=176.1s · axial · 3.5mm · 0.83mm/px · 1 of 20 slices shown]
[im 1/20]
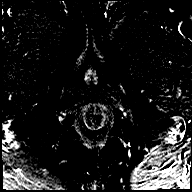

[Series 47: post t1_twist_tra_dyn-copy center · axial · 3.5mm · 0.83mm/px · 1 of 20 slices shown (18 of 24)]
[im 1/20]
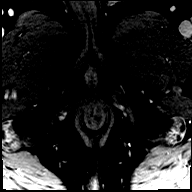

[Series 48: post t1_twist_tra_dyn-copy cent_sub_ttc=186.3s · axial · 3.5mm · 0.83mm/px · 1 of 20 slices shown]
[im 1/20]
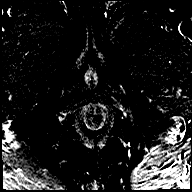

[Series 49: post t1_twist_tra_dyn-copy center · axial · 3.5mm · 0.83mm/px · 1 of 20 slices shown (19 of 24)]
[im 1/20]
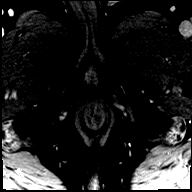

[Series 50: post t1_twist_tra_dyn-copy cent_sub_ttc=196.5s · axial · 3.5mm · 0.83mm/px · 1 of 20 slices shown]
[im 1/20]
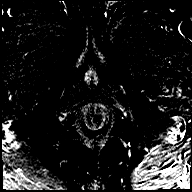

[Series 51: post t1_twist_tra_dyn-copy center · axial · 3.5mm · 0.83mm/px · 1 of 20 slices shown (20 of 24)]
[im 1/20]
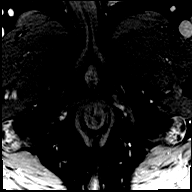

[Series 52: post t1_twist_tra_dyn-copy cent_sub_ttc=206.7s · axial · 3.5mm · 0.83mm/px · 1 of 20 slices shown]
[im 1/20]
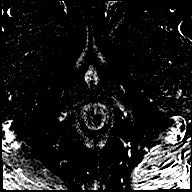

[Series 53: post t1_twist_tra_dyn-copy center · axial · 3.5mm · 0.83mm/px · 1 of 20 slices shown (21 of 24)]
[im 1/20]
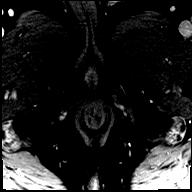

[Series 54: post t1_twist_tra_dyn-copy cent_sub_ttc=216.9s · axial · 3.5mm · 0.83mm/px · 1 of 20 slices shown]
[im 1/20]
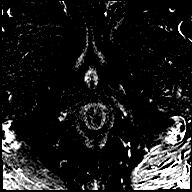

[Series 55: post t1_twist_tra_dyn-copy center · axial · 3.5mm · 0.83mm/px · 1 of 20 slices shown (22 of 24)]
[im 1/20]
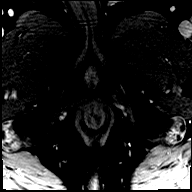

[Series 56: post t1_twist_tra_dyn-copy cent_sub_ttc=227.1s · axial · 3.5mm · 0.83mm/px · 1 of 20 slices shown]
[im 1/20]
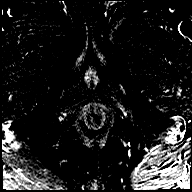

[Series 57: post t1_twist_tra_dyn-copy center · axial · 3.5mm · 0.83mm/px · 1 of 20 slices shown (23 of 24)]
[im 1/20]
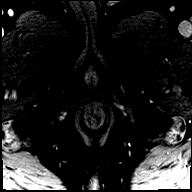

[Series 58: post t1_twist_tra_dyn-copy cent_sub_ttc=237.3s · axial · 3.5mm · 0.83mm/px · 1 of 20 slices shown]
[im 1/20]
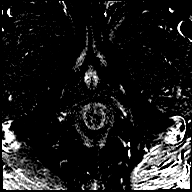

[Series 59: post t1_twist_tra_dyn-copy center · axial · 3.5mm · 0.83mm/px · 1 of 20 slices shown (24 of 24)]
[im 1/20]
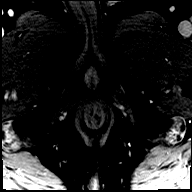

[56 of 56 positions shown; findings below may reference images not displayed]

FINDINGS: Prostate: No discrete/focal low T2 lesion within the central gland.
No early arterial enhancement. No restricted diffusion.

Mild nodularity of the central gland, which indents the base of the
bladder. No suspicious central gland nodule on T2.

Volume: 3.7 x 5.6 x 3.2 cm (volume = 35 mL)

Transcapsular spread:  Absent.

Seminal vesicle involvement: Absent.

Neurovascular bundle involvement: Absent.

Pelvic adenopathy: Absent.

Bone metastasis: Absent.

Other findings: Bladder is mildly thick-walled although
underdistended.

Sigmoid diverticulosis.
IMPRESSION: No findings suspicious for high-grade macroscopic prostate cancer on
MRI. PI-RADS 1.

## 2019-06-02 ENCOUNTER — Encounter: Payer: Medicare Other | Admitting: Family Medicine

## 2019-06-26 ENCOUNTER — Encounter: Payer: Self-pay | Admitting: Family Medicine

## 2019-06-26 ENCOUNTER — Ambulatory Visit (INDEPENDENT_AMBULATORY_CARE_PROVIDER_SITE_OTHER): Payer: Medicare Other | Admitting: Family Medicine

## 2019-06-26 ENCOUNTER — Other Ambulatory Visit: Payer: Self-pay

## 2019-06-26 VITALS — BP 108/66 | HR 82 | Temp 98.2°F | Ht 68.0 in | Wt 174.8 lb

## 2019-06-26 DIAGNOSIS — Z0000 Encounter for general adult medical examination without abnormal findings: Secondary | ICD-10-CM | POA: Diagnosis not present

## 2019-06-26 DIAGNOSIS — I10 Essential (primary) hypertension: Secondary | ICD-10-CM | POA: Diagnosis not present

## 2019-06-26 DIAGNOSIS — B351 Tinea unguium: Secondary | ICD-10-CM

## 2019-06-26 DIAGNOSIS — E782 Mixed hyperlipidemia: Secondary | ICD-10-CM

## 2019-06-26 DIAGNOSIS — Z0001 Encounter for general adult medical examination with abnormal findings: Secondary | ICD-10-CM

## 2019-06-26 DIAGNOSIS — Z1159 Encounter for screening for other viral diseases: Secondary | ICD-10-CM

## 2019-06-26 DIAGNOSIS — L723 Sebaceous cyst: Secondary | ICD-10-CM

## 2019-06-26 MED ORDER — TERBINAFINE HCL 250 MG PO TABS: 250.0000 mg | ORAL_TABLET | Freq: Every day | ORAL | 1 refills | Status: DC

## 2019-06-26 NOTE — Progress Notes (Signed)
BP 108/66   Pulse 82   Temp 98.2 F (36.8 C) (Temporal)   Ht '5\' 8"'$  (1.727 m)   Wt 174 lb 12.8 oz (79.3 kg)   SpO2 98%   BMI 26.58 kg/m    Subjective:   Patient ID: David Schwartz, male    DOB: Jan 24, 1946, 73 y.o.   MRN: [id number]  HPI: David Schwartz is a 73 y.o. male presenting on 06/26/2019 for Annual Exam   HPI Annual exam and recheck of chronic issues Patient is coming in for annual exam and recheck of his chronic medical issues.  He denies any major health issues or concerns except for one spot on his back on the upper part irritated that he has noticed recently over the past couple months that he wants to get that checked out.  He also says that he has been having some toenail fungus on both of his big toes and the second toe on the right side and he wants to know what he can do for that.  He has tried some topical antifungals without much success.  Hypertension Patient is currently on olmesartan-hydrochlorothiazide, and their blood pressure today is 108/66. Patient denies any lightheadedness or dizziness. Patient denies headaches, blurred vision, chest pains, shortness of breath, or weakness. Denies any side effects from medication and is content with current medication.   Hyperlipidemia Patient is coming in for recheck of his hyperlipidemia. The patient is currently taking crestor. They deny any issues with myalgias or history of liver damage from it. They deny any focal numbness or weakness or chest pain.   Relevant past medical, surgical, family and social history reviewed and updated as indicated. Interim medical history since our last visit reviewed. Allergies and medications reviewed and updated.  Review of Systems  Constitutional: Negative for chills and fever.  Respiratory: Negative for shortness of breath and wheezing.   Cardiovascular: Negative for chest pain and leg swelling.  Musculoskeletal: Negative for back pain and gait problem.  Skin: Negative for  rash.  Neurological: Negative for dizziness, weakness, light-headedness and numbness.  All other systems reviewed and are negative.   Per HPI unless specifically indicated above   Allergies as of 06/26/2019   No Known Allergies     Medication List       Accurate as of June 26, 2019 11:18 AM. If you have any questions, ask your nurse or doctor.        STOP taking these medications   predniSONE 1 MG tablet Commonly known as: DELTASONE Stopped by: Fransisca Kaufmann Trudie Cervantes, MD     TAKE these medications   alendronate 70 MG tablet Commonly known as: FOSAMAX Take 1 tablet (70 mg total) by mouth once a week.   aspirin EC 81 MG tablet Take 81 mg by mouth daily.   calcium carbonate 1500 (600 Ca) MG Tabs tablet Commonly known as: OSCAL Take by mouth daily with breakfast.   olmesartan-hydrochlorothiazide 20-12.5 MG tablet Commonly known as: BENICAR HCT Take 1 tablet by mouth daily at 2 PM.   rosuvastatin 5 MG tablet Commonly known as: CRESTOR Take 1 tablet (5 mg total) by mouth daily at 2 PM.   terazosin 2 MG capsule Commonly known as: HYTRIN Take 1 capsule (2 mg total) by mouth daily at 2 PM.   Vitamin D3 25 MCG (1000 UT) Caps Take by mouth.        Objective:   BP 108/66   Pulse 82   Temp 98.2 F (36.8  C) (Temporal)   Ht 5' 8" (1.727 m)   Wt 174 lb 12.8 oz (79.3 kg)   SpO2 98%   BMI 26.58 kg/m   Wt Readings from Last 3 Encounters:  06/26/19 174 lb 12.8 oz (79.3 kg)  06/13/18 179 lb 3.2 oz (81.3 kg)  05/30/18 177 lb (80.3 kg)    Physical Exam Vitals signs and nursing note reviewed.  Constitutional:      General: He is not in acute distress.    Appearance: He is well-developed. He is not diaphoretic.  Eyes:     General: No scleral icterus.    Conjunctiva/sclera: Conjunctivae normal.  Neck:     Musculoskeletal: Neck supple.     Thyroid: No thyromegaly.  Cardiovascular:     Rate and Rhythm: Normal rate and regular rhythm.     Heart sounds: Normal  heart sounds. No murmur.  Pulmonary:     Effort: Pulmonary effort is normal. No respiratory distress.     Breath sounds: Normal breath sounds. No wheezing.  Musculoskeletal: Normal range of motion.  Lymphadenopathy:     Cervical: No cervical adenopathy.  Skin:    General: Skin is warm and dry.     Findings: Lesion (sebaceous cyst, on right upper back, no erythema, draining normal) present. No rash.     Comments: Thickened and yellowed large toenails on both feet  Neurological:     Mental Status: He is alert and oriented to person, place, and time.     Coordination: Coordination normal.  Psychiatric:        Behavior: Behavior normal.       Assessment & Plan:   Problem List Items Addressed This Visit      Cardiovascular and Mediastinum   Hypertension   Relevant Orders   CBC with Differential/Platelet   CMP14+EGFR     Other   Hyperlipemia   Relevant Orders   CBC with Differential/Platelet   CMP14+EGFR   Lipid panel    Other Visit Diagnoses    Annual physical exam    -  Primary   Relevant Orders   CBC with Differential/Platelet   Need for hepatitis C screening test       Relevant Orders   Hepatitis C antibody   Onychomycosis       Relevant Medications   terbinafine (LAMISIL) 250 MG tablet   Sebaceous cyst        Will treat with terbinafine for onychomycosis, will check blood work, continue current medication although told him to watch his blood pressures and if they continue to run low we may cut back on his blood pressure medication.  Follow up plan: Return in about 6 months (around 12/25/2019), or if symptoms worsen or fail to improve, for Hypertension recheck.  Counseling provided for all of the vaccine components No orders of the defined types were placed in this encounter.   Caryl Pina, MD [facility] 06/26/2019, 11:18 AM

## 2019-06-27 LAB — LIPID PANEL
Chol/HDL Ratio: 3.3 ratio (ref 0.0–5.0)
Cholesterol, Total: 165 mg/dL (ref 100–199)
HDL: 50 mg/dL (ref >39)
LDL Chol Calc (NIH): 92 mg/dL (ref 0–99)
Triglycerides: 133 mg/dL (ref 0–149)
VLDL Cholesterol Cal: 23 mg/dL (ref 5–40)

## 2019-06-27 LAB — CBC WITH DIFFERENTIAL/PLATELET
Basophils Absolute: 0.1 10*3/uL (ref 0.0–0.2)
Basos: 1 %
EOS (ABSOLUTE): 0.3 10*3/uL (ref 0.0–0.4)
Eos: 4 %
Hematocrit: 47.8 % (ref 37.5–51.0)
Hemoglobin: 16.7 g/dL (ref 13.0–17.7)
Immature Grans (Abs): 0 10*3/uL (ref 0.0–0.1)
Immature Granulocytes: 0 %
Lymphocytes Absolute: 2.2 10*3/uL (ref 0.7–3.1)
Lymphs: 33 %
MCH: 31.5 pg (ref 26.6–33.0)
MCHC: 34.9 g/dL (ref 31.5–35.7)
MCV: 90 fL (ref 79–97)
Monocytes Absolute: 0.6 10*3/uL (ref 0.1–0.9)
Monocytes: 9 %
Neutrophils Absolute: 3.4 10*3/uL (ref 1.4–7.0)
Neutrophils: 53 %
Platelets: 220 10*3/uL (ref 150–450)
RBC: 5.31 x10E6/uL (ref 4.14–5.80)
RDW: 12.7 % (ref 11.6–15.4)
WBC: 6.5 10*3/uL (ref 3.4–10.8)

## 2019-06-27 LAB — HEPATITIS C ANTIBODY: Hep C Virus Ab: <0.1 s/co ratio (ref 0.0–0.9)

## 2019-06-27 LAB — CMP14+EGFR
ALT: 13 IU/L (ref 0–44)
AST: 20 IU/L (ref 0–40)
Albumin/Globulin Ratio: 1.9 (ref 1.2–2.2)
Albumin: 4.6 g/dL (ref 3.7–4.7)
Alkaline Phosphatase: 49 IU/L (ref 39–117)
BUN/Creatinine Ratio: 21 (ref 10–24)
BUN: 18 mg/dL (ref 8–27)
Bilirubin Total: 0.9 mg/dL (ref 0.0–1.2)
CO2: 26 mmol/L (ref 20–29)
Calcium: 9.3 mg/dL (ref 8.6–10.2)
Chloride: 101 mmol/L (ref 96–106)
Creatinine, Ser: 0.85 mg/dL (ref 0.76–1.27)
GFR calc Af Amer: 100 mL/min/{1.73_m2} (ref >59)
GFR calc non Af Amer: 86 mL/min/{1.73_m2} (ref >59)
Globulin, Total: 2.4 g/dL (ref 1.5–4.5)
Glucose: 91 mg/dL (ref 65–99)
Potassium: 4.1 mmol/L (ref 3.5–5.2)
Sodium: 139 mmol/L (ref 134–144)
Total Protein: 7 g/dL (ref 6.0–8.5)

## 2019-07-14 ENCOUNTER — Other Ambulatory Visit: Payer: Self-pay

## 2019-07-15 ENCOUNTER — Encounter: Payer: Self-pay | Admitting: Family Medicine

## 2019-07-15 ENCOUNTER — Other Ambulatory Visit: Payer: Self-pay

## 2019-07-15 ENCOUNTER — Ambulatory Visit (INDEPENDENT_AMBULATORY_CARE_PROVIDER_SITE_OTHER): Payer: Medicare Other | Admitting: Family Medicine

## 2019-07-15 VITALS — BP 81/54 | HR 110 | Temp 97.1°F | Ht 68.0 in | Wt 172.0 lb

## 2019-07-15 DIAGNOSIS — I10 Essential (primary) hypertension: Secondary | ICD-10-CM | POA: Diagnosis not present

## 2019-07-15 MED ORDER — KETOCONAZOLE 2 % EX CREA: 1.0000 "application " | TOPICAL_CREAM | Freq: Every day | CUTANEOUS | 1 refills | Status: DC

## 2019-07-15 NOTE — Patient Instructions (Signed)
Stop taking the blood pressure pill Benicar HCT, keep track of blood pressures and notify me in 2 weeks

## 2019-07-15 NOTE — Addendum Note (Signed)
Addended by: Caryl Pina on: 07/15/2019 04:28 PM   Modules accepted: Orders

## 2019-07-15 NOTE — Progress Notes (Signed)
BP (!) 81/54   Pulse (!) 110   Temp (!) 97.1 F (36.2 C) (Temporal)   Ht 5\' 8"  (1.727 m)   Wt 172 lb (78 kg)   SpO2 99%   BMI 26.15 kg/m    Subjective:   Patient ID: David Schwartz, male    DOB: 02-Apr-1946, 73 y.o.   MRN: [id number]  HPI: David Schwartz is a 73 y.o. male presenting on 07/15/2019 for Hypotension (Has been running low since apt 10/22) and Dizziness   HPI Hypertension Patient is currently on olmesartan HCT, and their blood pressure today is 81/54.  Is having lightheadedness and dizziness especially when he leans forward or stands up.  His blood pressure has been running down and just feels like he has no energy.  Is been increasing over the past few months but definitely has been up since his last visit.  Relevant past medical, surgical, family and social history reviewed and updated as indicated. Interim medical history since our last visit reviewed. Allergies and medications reviewed and updated.  Review of Systems  Constitutional: Negative for chills and fever.  Respiratory: Negative for shortness of breath and wheezing.   Cardiovascular: Negative for chest pain and leg swelling.  Musculoskeletal: Negative for back pain and gait problem.  Skin: Negative for rash.  Neurological: Negative for dizziness, weakness and numbness.  All other systems reviewed and are negative.   Per HPI unless specifically indicated above   Allergies as of 07/15/2019   No Known Allergies     Medication List       Accurate as of July 15, 2019  4:26 PM. If you have any questions, ask your nurse or doctor.        STOP taking these medications   olmesartan-hydrochlorothiazide 20-12.5 MG tablet Commonly known as: BENICAR HCT Stopped by: Fransisca Kaufmann Graceland Wachter, MD     TAKE these medications   alendronate 70 MG tablet Commonly known as: FOSAMAX Take 1 tablet (70 mg total) by mouth once a week.   aspirin EC 81 MG tablet Take 81 mg by mouth daily.   calcium  carbonate 1500 (600 Ca) MG Tabs tablet Commonly known as: OSCAL Take by mouth daily with breakfast.   rosuvastatin 5 MG tablet Commonly known as: CRESTOR Take 1 tablet (5 mg total) by mouth daily at 2 PM.   terazosin 2 MG capsule Commonly known as: HYTRIN Take 1 capsule (2 mg total) by mouth daily at 2 PM.   terbinafine 250 MG tablet Commonly known as: LAMISIL Take 1 tablet (250 mg total) by mouth daily.   Vitamin D3 25 MCG (1000 UT) Caps Take by mouth.        Objective:   BP (!) 81/54   Pulse (!) 110   Temp (!) 97.1 F (36.2 C) (Temporal)   Ht 5\' 8"  (1.727 m)   Wt 172 lb (78 kg)   SpO2 99%   BMI 26.15 kg/m   Wt Readings from Last 3 Encounters:  07/15/19 172 lb (78 kg)  06/26/19 174 lb 12.8 oz (79.3 kg)  06/13/18 179 lb 3.2 oz (81.3 kg)    Physical Exam Vitals signs and nursing note reviewed.  Constitutional:      General: He is not in acute distress.    Appearance: He is well-developed. He is not diaphoretic.  Eyes:     General: No scleral icterus.    Conjunctiva/sclera: Conjunctivae normal.  Cardiovascular:     Rate and Rhythm: Normal rate and  regular rhythm.     Heart sounds: Normal heart sounds. No murmur.  Pulmonary:     Effort: Pulmonary effort is normal. No respiratory distress.     Breath sounds: Normal breath sounds. No wheezing.  Skin:    General: Skin is warm and dry.     Findings: No rash.  Neurological:     Mental Status: He is alert and oriented to person, place, and time.     Coordination: Coordination normal.  Psychiatric:        Behavior: Behavior normal.       Assessment & Plan:   Problem List Items Addressed This Visit      Cardiovascular and Mediastinum   Hypertension - Primary       Follow up plan: Return if symptoms worsen or fail to improve.  Counseling provided for all of the vaccine components No orders of the defined types were placed in this encounter.   Caryl Pina, MD [facility] 07/15/2019, 4:26 PM

## 2019-07-22 ENCOUNTER — Telehealth: Payer: Self-pay | Admitting: Family Medicine

## 2019-07-22 NOTE — Telephone Encounter (Signed)
Patient advised to relax for ten minutes before taking blood pressure.  Keep hydrated and decrease any salt intake.  He was advised to keep more readings and report back tomorrow to nurse for provider review.  Is aware, pcp will review.

## 2019-07-22 NOTE — Telephone Encounter (Signed)
With it fluctuating so much I do not advise a medication change. Keep tracking the BP Make follow up with PCP at next available appointment Pass this note to PCP  If BP is 180/120 go to emergency room.  Hydrate well and eat well

## 2019-07-23 ENCOUNTER — Ambulatory Visit (INDEPENDENT_AMBULATORY_CARE_PROVIDER_SITE_OTHER): Payer: Medicare Other | Admitting: Family Medicine

## 2019-07-23 ENCOUNTER — Encounter: Payer: Self-pay | Admitting: Family Medicine

## 2019-07-23 ENCOUNTER — Other Ambulatory Visit: Payer: Self-pay

## 2019-07-23 DIAGNOSIS — I10 Essential (primary) hypertension: Secondary | ICD-10-CM | POA: Diagnosis not present

## 2019-07-23 NOTE — Progress Notes (Signed)
Virtual Visit via Telephone Note  I connected with David Schwartz on 07/23/19 at 10:31 AM by telephone and verified that I am speaking with the correct person using two identifiers. David Schwartz is currently located at home and nobody is currently with him during this visit. The provider, Loman Brooklyn, FNP is located in their home at time of visit.  I discussed the limitations, risks, security and privacy concerns of performing an evaluation and management service by telephone and the availability of in person appointments. I also discussed with the patient that there may be a patient responsible charge related to this service. The patient expressed understanding and agreed to proceed.  Subjective: PCP: Dettinger, Fransisca Kaufmann, MD  Chief Complaint  Patient presents with  . Other    discuss blood pressure   Patient was taken off his blood pressure medication a week ago. He has been checking his blood pressure multiple times a day since then as he is worried it is too high. He is keeping a log. BP this morning was 137/84, last night it was 146/99, the other two readings from yesterday were 138/85 and 134/103. His heart rate has been mostly 80-90s but did get up to 112 once. He reports he has not changed anything with exercise or diet and his weight has not changed either. He stays active during the day.    ROS: Per HPI  Current Outpatient Medications:  .  alendronate (FOSAMAX) 70 MG tablet, Take 1 tablet (70 mg total) by mouth once a week., Disp: 12 tablet, Rfl: 3 .  aspirin EC 81 MG tablet, Take 81 mg by mouth daily., Disp: , Rfl:  .  calcium carbonate (OSCAL) 1500 (600 Ca) MG TABS tablet, Take by mouth daily with breakfast., Disp: , Rfl:  .  Cholecalciferol (VITAMIN D3) 1000 units CAPS, Take by mouth., Disp: , Rfl:  .  ketoconazole (NIZORAL) 2 % cream, Apply 1 application topically daily., Disp: 60 g, Rfl: 1 .  rosuvastatin (CRESTOR) 5 MG tablet, Take 1 tablet (5 mg total) by  mouth daily at 2 PM., Disp: 90 tablet, Rfl: 3 .  terazosin (HYTRIN) 2 MG capsule, Take 1 capsule (2 mg total) by mouth daily at 2 PM., Disp: 90 capsule, Rfl: 3 .  terbinafine (LAMISIL) 250 MG tablet, Take 1 tablet (250 mg total) by mouth daily., Disp: 90 tablet, Rfl: 1  No Known Allergies Past Medical History:  Diagnosis Date  . Arthritis   . Hyperlipidemia   . Hypertension     Observations/Objective: A&O  No respiratory distress or wheezing audible over the phone Mood, judgement, and thought processes all WNL  Assessment and Plan: 1. Essential hypertension - Patient very worried about his blood pressure since he has been on medication for so long and now he isn't. Reassurance provided. Educated that his goal at 73 years of age is < 150/90 for his BP and 60-100 for his heart rate. Advised that he stop taking his BP multiple times a day as he is just stressing himself out more. He agreed with my recommendation of only taking BP once daily. I recommended he do a different time of day each time. He is to be sitting down, calm, and relaxed before checking BP. He is going to keep a log and call back if BP is consistently > 150/90. Also encouraged routine exercise and low salt diet.    Follow Up Instructions:  I discussed the assessment and treatment plan with the patient.  The patient was provided an opportunity to ask questions and all were answered. The patient agreed with the plan and demonstrated an understanding of the instructions.   The patient was advised to call back or seek an in-person evaluation if the symptoms worsen or if the condition fails to improve as anticipated.  The above assessment and management plan was discussed with the patient. The patient verbalized understanding of and has agreed to the management plan. Patient is aware to call the clinic if symptoms persist or worsen. Patient is aware when to return to the clinic for a follow-up visit. Patient educated on when it  is appropriate to go to the emergency department.   Time call ended: 10:45 AM  I provided 16 minutes of non-face-to-face time during this encounter.  Hendricks Limes, MSN, APRN, FNP-C [facility] 07/23/19

## 2019-07-23 NOTE — Telephone Encounter (Signed)
I agree, no medication change for now, continue to monitor and always relax for 5 to 10 minutes prior to taking the blood pressure and let me know how it is running.

## 2019-07-23 NOTE — Telephone Encounter (Signed)
Patient has televisit with Britney today.

## 2019-07-28 ENCOUNTER — Other Ambulatory Visit: Payer: Self-pay

## 2019-07-28 ENCOUNTER — Encounter: Payer: Self-pay | Admitting: Family Medicine

## 2019-07-28 ENCOUNTER — Ambulatory Visit (INDEPENDENT_AMBULATORY_CARE_PROVIDER_SITE_OTHER): Payer: Medicare Other | Admitting: Family Medicine

## 2019-07-28 VITALS — BP 138/78 | HR 70 | Temp 98.4°F | Ht 68.0 in | Wt 179.0 lb

## 2019-07-28 DIAGNOSIS — M7741 Metatarsalgia, right foot: Secondary | ICD-10-CM

## 2019-07-28 NOTE — Progress Notes (Signed)
Subjective: CC: Gout flare PCP: Dettinger, Fransisca Kaufmann, MD [id number] David Schwartz is a 73 y.o. male presenting to clinic today for:  1. Gout flare? Patient reports pain in digits 2 through 4 on the right foot.  He notes this started occurring a few days ago after wearing an old pair shoes and working out in the yard.  Symptoms have gradually gotten better with more supportive footwear but have not totally resolved.  He notes that there was tenderness to palpation along the dorsal aspect of the toes.  Denies any discoloration, swelling.  He did feel a slight burning, numb pain in that area as well.  He has not used anything specifically for treatment.  He is currently being treated with antifungals for foot fungus.   ROS: Per HPI  No Known Allergies Past Medical History:  Diagnosis Date  . Arthritis   . Hyperlipidemia   . Hypertension     Current Outpatient Medications:  .  alendronate (FOSAMAX) 70 MG tablet, Take 1 tablet (70 mg total) by mouth once a week., Disp: 12 tablet, Rfl: 3 .  aspirin EC 81 MG tablet, Take 81 mg by mouth daily., Disp: , Rfl:  .  calcium carbonate (OSCAL) 1500 (600 Ca) MG TABS tablet, Take by mouth daily with breakfast., Disp: , Rfl:  .  Cholecalciferol (VITAMIN D3) 1000 units CAPS, Take by mouth., Disp: , Rfl:  .  ketoconazole (NIZORAL) 2 % cream, Apply 1 application topically daily., Disp: 60 g, Rfl: 1 .  rosuvastatin (CRESTOR) 5 MG tablet, Take 1 tablet (5 mg total) by mouth daily at 2 PM., Disp: 90 tablet, Rfl: 3 .  terazosin (HYTRIN) 2 MG capsule, Take 1 capsule (2 mg total) by mouth daily at 2 PM., Disp: 90 capsule, Rfl: 3 .  terbinafine (LAMISIL) 250 MG tablet, Take 1 tablet (250 mg total) by mouth daily., Disp: 90 tablet, Rfl: 1 Social History   Socioeconomic History  . Marital status: Single    Spouse name: Not on file  . Number of children: Not on file  . Years of education: Not on file  . Highest education level: Not on file  Occupational History   . Not on file  Social Needs  . Financial resource strain: Not on file  . Food insecurity    Worry: Not on file    Inability: Not on file  . Transportation needs    Medical: Not on file    Non-medical: Not on file  Tobacco Use  . Smoking status: Never Smoker  . Smokeless tobacco: Never Used  Substance and Sexual Activity  . Alcohol use: Yes    Comment: occ  . Drug use: Never  . Sexual activity: Not on file  Lifestyle  . Physical activity    Days per week: Not on file    Minutes per session: Not on file  . Stress: Not on file  Relationships  . Social Herbalist on phone: Not on file    Gets together: Not on file    Attends religious service: Not on file    Active member of club or organization: Not on file    Attends meetings of clubs or organizations: Not on file    Relationship status: Not on file  . Intimate partner violence    Fear of current or ex partner: Not on file    Emotionally abused: Not on file    Physically abused: Not on file    Forced sexual  activity: Not on file  Other Topics Concern  . Not on file  Social History Narrative  . Not on file   Family History  Problem Relation Age of Onset  . Diabetes Mother   . Hypertension Mother   . Early death Father   . Arthritis Maternal Grandmother   . Hypertension Maternal Grandfather   . Hypertension Paternal Grandmother   . Cancer Paternal Grandfather        lung    Objective: Office vital signs reviewed. BP 138/78   Pulse 70   Temp 98.4 F (36.9 C) (Temporal)   Ht 5\' 8"  (1.727 m)   Wt 179 lb (81.2 kg)   BMI 27.22 kg/m   Physical Examination:  General: Awake, alert, well nourished, No acute distress MSK:  Right foot: No gross swelling, joint discoloration.  He has onychomycotic changes to the nails throughout.  He has slight claw foot noted and appreciable bunion formation with associated deviation of the great toe.  No palpable Morton's neuroma noted on the plantar aspect.  Toes are  nontender to palpation. Neuro: Light touch and station grossly intact  Assessment/ Plan: 74 y.o. male   1. Metatarsalgia, right foot I suspect metatarsalgia.  We discussed using a metatarsal pad.  Avoid old foot wear and wear only supportive shoes.  I placed a referral to podiatry as well in case symptoms do not fully resolve.  Patient will follow up as needed - Ambulatory referral to Podiatry   No orders of the defined types were placed in this encounter.  No orders of the defined types were placed in this encounter.    Janora Norlander, DO [place] [phone]

## 2019-07-28 NOTE — Patient Instructions (Signed)
Purchase a metatarsal pad (med or large should do it) and use in the right shoe.   Morton Neuralgia  Morton neuralgia is foot pain that affects the ball of the foot and the area near the toes. Morton neuralgia occurs when part of a nerve in the foot (digital nerve) is under too much pressure (compressed). When this happens over a long period of time, the nerve can thicken (neuroma) and cause pain. Pain usually occurs between the third and fourth toes.  Morton neuralgia can come and go but may get worse over time. What are the causes? This condition is caused by doing the same things over and over with your foot, such as:  Activities such as running or jumping.  Wearing shoes that are too tight. What increases the risk? You may be at higher risk for Morton neuralgia if you:  Are male.  Wear high heels.  Wear shoes that are narrow or tight.  Do activities that repeatedly stretch your toes, such as: ? Running. ? [place]. ? Long-distance walking. What are the signs or symptoms? The first symptom of Morton neuralgia is pain that spreads from the ball of the foot to the toes. It may feel like you are walking on a marble. Pain usually gets worse with walking and goes away at night. Other symptoms may include numbness and cramping of your toes. Both feet are equally affected, but rarely at the same time. How is this diagnosed? This condition is diagnosed based on your symptoms, your medical history, and a physical exam. Your health care provider may:  Squeeze your foot just behind your toe.  Ask you to move your toes to check for pain.  Ask about your physical activity level. You also may have imaging tests, such as an X-ray, ultrasound, or MRI. How is this treated? Treatment depends on how severe your condition is and what causes it. Treatment may involve:  Wearing different shoes that are not too tight, are low-heeled, and provide good support. For some people, this is the only  treatment needed.  Wearing an over-the-counter or custom supportive pad (orthotic) under the front of your foot.  Getting injections of numbing medicine and anti-inflammatory medicine (steroid) in the nerve.  Having surgery to remove part of the thickened nerve. Follow these instructions at home: Managing pain, stiffness, and swelling   Massage your foot as needed.  Wear orthotics as told by your health care provider.  If directed, put ice on your foot: ? Put ice in a plastic bag. ? Place a towel between your skin and the bag. ? Leave the ice on for 20 minutes, 2-3 times a day.  Avoid activities that cause pain or make pain worse. If you play sports, ask your health care provider when it is safe for you to return to sports.  Raise (elevate) your foot above the level of your heart while lying down and, when possible, while sitting. General instructions  Take over-the-counter and prescription medicines only as told by your health care provider.  Do not drive or use heavy machinery while taking prescription pain medicine.  Wear shoes that: ? Have soft soles. ? Have a wide toe area. ? Provide arch support. ? Do not pinch or squeeze your feet. ? Have room for your orthotics, if applicable.  Keep all follow-up visits as told by your health care provider. This is important. Contact a health care provider if:  Your symptoms get worse or do not get better with treatment and  home care. Summary  Morton neuralgia is foot pain that affects the ball of the foot and the area near the toes. Pain usually occurs between the third and fourth toes, gets worse with walking, and goes away at night.  Morton neuralgia occurs when part of a nerve in the foot (digital nerve) is under too much pressure. When this happens over a long period of time, the nerve can thicken (neuroma) and cause pain.  This condition is caused by doing the same things over and over with your foot, such as running or  jumping, wearing shoes that are too tight, or wearing high heels.  Treatment may involve wearing low-heeled shoes that are not too tight, wearing a supportive pad (orthotic) under the front of your foot, getting injections in the nerve, or having surgery to remove part of the thickened nerve. This information is not intended to replace advice given to you by your health care provider. Make sure you discuss any questions you have with your health care provider. Document Released: 11/27/2000 Document Revised: 09/04/2017 Document Reviewed: 09/04/2017 Elsevier Patient Education  2020 [employer].

## 2019-08-06 ENCOUNTER — Ambulatory Visit (INDEPENDENT_AMBULATORY_CARE_PROVIDER_SITE_OTHER): Payer: Medicare Other | Admitting: Urology

## 2019-08-06 DIAGNOSIS — C61 Malignant neoplasm of prostate: Secondary | ICD-10-CM

## 2019-08-06 DIAGNOSIS — N401 Enlarged prostate with lower urinary tract symptoms: Secondary | ICD-10-CM

## 2019-08-06 DIAGNOSIS — N529 Male erectile dysfunction, unspecified: Secondary | ICD-10-CM | POA: Diagnosis not present

## 2019-10-18 ENCOUNTER — Ambulatory Visit: Payer: Medicare Other | Attending: Internal Medicine

## 2019-10-18 ENCOUNTER — Other Ambulatory Visit: Payer: Self-pay

## 2019-10-18 DIAGNOSIS — Z23 Encounter for immunization: Secondary | ICD-10-CM | POA: Insufficient documentation

## 2019-10-18 NOTE — Progress Notes (Signed)
   Covid-19 Vaccination Clinic  Name:  Shannan Emanuel    MRN: [id number] DOB: 1946/06/20  10/18/2019  Mr. Blickenstaff was observed post Covid-19 immunization for 15 minutes without incidence. He was provided with Vaccine Information Sheet and instruction to access the V-Safe system.   Mr. Keilty was instructed to call 911 with any severe reactions post vaccine: Marland Kitchen Difficulty breathing  . Swelling of your face and throat  . A fast heartbeat  . A bad rash all over your body  . Dizziness and weakness    Immunizations Administered    Name Date Dose VIS Date Route   Moderna COVID-19 Vaccine 10/18/2019  1:43 PM 0.5 mL 08/05/2019 Intramuscular   Manufacturer: Moderna   Lot: [id number]   [id number]

## 2019-10-22 ENCOUNTER — Other Ambulatory Visit: Payer: Self-pay | Admitting: Family Medicine

## 2019-10-26 ENCOUNTER — Other Ambulatory Visit: Payer: Self-pay | Admitting: Family Medicine

## 2019-10-26 DIAGNOSIS — E782 Mixed hyperlipidemia: Secondary | ICD-10-CM

## 2019-11-15 ENCOUNTER — Ambulatory Visit: Payer: Self-pay | Attending: Internal Medicine

## 2019-11-15 DIAGNOSIS — Z23 Encounter for immunization: Secondary | ICD-10-CM

## 2019-11-15 NOTE — Progress Notes (Signed)
   Covid-19 Vaccination Clinic  Name:  Shanton Kimmey    MRN: [id number] DOB: 12/25/1945  11/15/2019  Mr. Kehn was observed post Covid-19 immunization for 15 minutes without incident. He was provided with Vaccine Information Sheet and instruction to access the V-Safe system.   Mr. Sudbeck was instructed to call 911 with any severe reactions post vaccine: Marland Kitchen Difficulty breathing  . Swelling of face and throat  . A fast heartbeat  . A bad rash all over body  . Dizziness and weakness   Immunizations Administered    Name Date Dose VIS Date Route   Moderna COVID-19 Vaccine 11/15/2019 12:02 PM 0.5 mL 08/05/2019 Intramuscular   Manufacturer: Moderna   Lot: [id number]   [id number]

## 2019-11-28 ENCOUNTER — Telehealth: Payer: Self-pay | Admitting: Family Medicine

## 2019-11-28 NOTE — Chronic Care Management (AMB) (Signed)
  Chronic Care Management   Outreach Note  11/28/2019 Name: David Schwartz MRN: [id number] DOB: 06/11/1946  David Schwartz is a 74 y.o. year old male who is a primary care patient of Dettinger, Fransisca Kaufmann, MD. I reached out to [name] by phone today in response to a referral sent by [name] health plan.     An unsuccessful telephone outreach was attempted today. The patient was referred to the case management team for assistance with care management and care coordination.   Follow Up Plan: A HIPPA compliant phone message was left for the patient providing contact information and requesting a return call.  The care management team will reach out to the patient again over the next 7 days.  If patient returns call to provider office, please advise to call [place] at [phone].  [place], [place] [address] Direct Dial: [phone] [email or web address] Website: [email or web address]

## 2019-12-01 NOTE — Chronic Care Management (AMB) (Signed)
  Chronic Care Management   Note  12/01/2019 Name: David Schwartz MRN: [id number] DOB: 27-Aug-1946  Regie Bunner is a 74 y.o. year old male who is a primary care patient of Dettinger, Fransisca Kaufmann, MD. I reached out to [name] by phone today in response to a referral sent by Mr. Earland Reish [facility] health plan.     Mr. Barca was given information about Chronic Care Management services today including:  1. CCM service includes personalized support from designated clinical staff supervised by his physician, including individualized plan of care and coordination with other care providers 2. 24/7 contact phone numbers for assistance for urgent and routine care needs. 3. Service will only be billed when office clinical staff spend 20 minutes or more in a month to coordinate care. 4. Only one practitioner may furnish and bill the service in a calendar month. 5. The patient may stop CCM services at any time (effective at the end of the month) by phone call to the office staff. 6. The patient will be responsible for cost sharing (co-pay) of up to 20% of the service fee (after annual deductible is met).  Patient did not agree to enrollment in care management services and does not wish to consider at this time.  Follow up plan: The patient has been provided with contact information for the care management team and has been advised to call with any health related questions or concerns.   [place], [place] [address] Direct Dial: [phone] [email or web address]: [email or web address]

## 2019-12-19 ENCOUNTER — Other Ambulatory Visit: Payer: Self-pay | Admitting: Family Medicine

## 2019-12-19 DIAGNOSIS — B351 Tinea unguium: Secondary | ICD-10-CM

## 2019-12-25 ENCOUNTER — Ambulatory Visit (INDEPENDENT_AMBULATORY_CARE_PROVIDER_SITE_OTHER): Payer: Medicare Other | Admitting: Family Medicine

## 2019-12-25 ENCOUNTER — Encounter: Payer: Self-pay | Admitting: Pharmacist

## 2019-12-25 ENCOUNTER — Encounter: Payer: Self-pay | Admitting: Family Medicine

## 2019-12-25 ENCOUNTER — Other Ambulatory Visit: Payer: Self-pay

## 2019-12-25 VITALS — BP 126/83 | HR 124 | Temp 97.7°F | Ht 68.0 in | Wt 173.5 lb

## 2019-12-25 DIAGNOSIS — E782 Mixed hyperlipidemia: Secondary | ICD-10-CM

## 2019-12-25 DIAGNOSIS — M818 Other osteoporosis without current pathological fracture: Secondary | ICD-10-CM

## 2019-12-25 DIAGNOSIS — R000 Tachycardia, unspecified: Secondary | ICD-10-CM | POA: Diagnosis not present

## 2019-12-25 DIAGNOSIS — I4891 Unspecified atrial fibrillation: Secondary | ICD-10-CM

## 2019-12-25 DIAGNOSIS — R972 Elevated prostate specific antigen [PSA]: Secondary | ICD-10-CM | POA: Diagnosis not present

## 2019-12-25 DIAGNOSIS — I10 Essential (primary) hypertension: Secondary | ICD-10-CM

## 2019-12-25 MED ORDER — METOPROLOL SUCCINATE ER 25 MG PO TB24: 25.0000 mg | ORAL_TABLET | Freq: Every day | ORAL | 3 refills | Status: DC

## 2019-12-25 MED ORDER — APIXABAN 5 MG PO TABS: 5.0000 mg | ORAL_TABLET | Freq: Two times a day (BID) | ORAL | 3 refills | Status: DC

## 2019-12-25 NOTE — Progress Notes (Signed)
BP 126/83   Pulse (!) 124   Temp 97.7 F (36.5 C) (Temporal)   Ht '5\' 8"'  (1.727 m)   Wt 173 lb 8 oz (78.7 kg)   BMI 26.38 kg/m    Subjective:   Patient ID: David Schwartz, male    DOB: 27-Sep-1945, 74 y.o.   MRN: [id number]  HPI: David Schwartz is a 74 y.o. male presenting on 12/25/2019 for Medical Management of Chronic Issues   HPI Hypertension Patient is currently on no medication currently, has been doing well, and their blood pressure today is 126/83. Patient denies any lightheadedness or dizziness. Patient denies headaches, blurred vision, chest pains, shortness of breath, or weakness. Denies any side effects from medication and is content with current medication.   Hyperlipidemia Patient is coming in for recheck of his hyperlipidemia. The patient is currently taking Crestor. They deny any issues with myalgias or history of liver damage from it. They deny any focal numbness or weakness or chest pain.   Patient has a history of elevated PSA, he does see the urologist for this.  [facility] and [facility] the [place] and he will get the records for [place].  Patient does take both Terazosin  Patient has onychomycosis and especially his left great toenail but some of his smaller toes as well.  He is taking terbinafine and it is about half cleared.  Relevant past medical, surgical, family and social history reviewed and updated as indicated. Interim medical history since our last visit reviewed. Allergies and medications reviewed and updated.  Review of Systems  Constitutional: Negative for chills and fever.  Respiratory: Negative for shortness of breath and wheezing.   Cardiovascular: Positive for palpitations. Negative for chest pain and leg swelling.  Musculoskeletal: Negative for back pain and gait problem.  Skin: Negative for rash.  All other systems reviewed and are negative.   Per HPI unless specifically indicated above   Allergies as of  12/25/2019   No Known Allergies     Medication List       Accurate as of December 25, 2019  9:39 AM. If you have any questions, ask your nurse or doctor.        alendronate 70 MG tablet Commonly known as: FOSAMAX TAKE 1 TABLET BY MOUTH ONCE A WEEK   aspirin EC 81 MG tablet Take 81 mg by mouth daily.   calcium carbonate 1500 (600 Ca) MG Tabs tablet Commonly known as: OSCAL Take by mouth daily with breakfast.   ketoconazole 2 % cream Commonly known as: NIZORAL Apply 1 application topically daily.   rosuvastatin 5 MG tablet Commonly known as: CRESTOR TAKE 1 TABLET BY MOUTH  DAILY AT 2 PM.   terazosin 2 MG capsule Commonly known as: HYTRIN Take 1 capsule (2 mg total) by mouth daily at 2 PM.   terbinafine 250 MG tablet Commonly known as: LAMISIL Take 1 tablet by mouth once daily   Vitamin D3 25 MCG (1000 UT) Caps Take by mouth.        Objective:   BP 126/83   Pulse (!) 124   Temp 97.7 F (36.5 C) (Temporal)   Ht '5\' 8"'  (1.727 m)   Wt 173 lb 8 oz (78.7 kg)   BMI 26.38 kg/m   Wt Readings from Last 3 Encounters:  12/25/19 173 lb 8 oz (78.7 kg)  07/28/19 179 lb (81.2 kg)  07/15/19 172 lb (78 kg)    Physical Exam  Vitals and nursing note reviewed.  Constitutional:      General: He is not in acute distress.    Appearance: He is well-developed. He is not diaphoretic.  Eyes:     General: No scleral icterus.       Right eye: No discharge.     Conjunctiva/sclera: Conjunctivae normal.     Pupils: Pupils are equal, round, and reactive to light.  Neck:     Thyroid: No thyromegaly.  Cardiovascular:     Rate and Rhythm: Regular rhythm. Tachycardia present.     Heart sounds: Normal heart sounds. No murmur.  Pulmonary:     Effort: Pulmonary effort is normal. No respiratory distress.     Breath sounds: Normal breath sounds. No wheezing.  Musculoskeletal:        General: Normal range of motion.     Cervical back: Neck supple.  Lymphadenopathy:     Cervical: No  cervical adenopathy.  Skin:    General: Skin is warm and dry.     Findings: No rash.  Neurological:     Mental Status: He is alert and oriented to person, place, and time.     Coordination: Coordination normal.  Psychiatric:        Behavior: Behavior normal.     Assessment & Plan:   Problem List Items Addressed This Visit      Cardiovascular and Mediastinum   Hypertension   Relevant Medications   apixaban (ELIQUIS) 5 MG TABS tablet   Other Relevant Orders   CBC with Differential/Platelet   CMP14+EGFR   Lipid panel     Musculoskeletal and Integument   Osteoporosis     Other   Hyperlipemia - Primary   Relevant Medications   apixaban (ELIQUIS) 5 MG TABS tablet   Other Relevant Orders   CMP14+EGFR   Lipid panel   Elevated PSA measurement    Other Visit Diagnoses    Tachycardia       Relevant Orders   EKG 12-Lead (Completed)   New onset atrial fibrillation (HCC)       Relevant Medications   apixaban (ELIQUIS) 5 MG TABS tablet   Other Relevant Orders   Ambulatory referral to Cardiology      EKG:Afib w controlled rate at 108, irregularly irregular  Continue patient's current medication, it seems like the doing well, it sounds like he also got a prescription for Cialis from his urologists and he has not had any lightheadedness or dizziness.  He uses it.  Gave samples for Eliquis for starter, patient had a discussion with Lottie Dawson, pharmacist Follow up plan: Return in about 6 months (around 06/25/2020), or if symptoms worsen or fail to improve, for Hypertension and cholesterol.  Counseling provided for all of the vaccine components No orders of the defined types were placed in this encounter.   Caryl Pina, MD [facility] 12/25/2019, 9:39 AM

## 2019-12-26 LAB — CMP14+EGFR
ALT: 9 IU/L (ref 0–44)
AST: 19 IU/L (ref 0–40)
Albumin/Globulin Ratio: 2.1 (ref 1.2–2.2)
Albumin: 4.6 g/dL (ref 3.7–4.7)
Alkaline Phosphatase: 53 IU/L (ref 39–117)
BUN/Creatinine Ratio: 19 (ref 10–24)
BUN: 14 mg/dL (ref 8–27)
Bilirubin Total: 0.8 mg/dL (ref 0.0–1.2)
CO2: 25 mmol/L (ref 20–29)
Calcium: 9.3 mg/dL (ref 8.6–10.2)
Chloride: 101 mmol/L (ref 96–106)
Creatinine, Ser: 0.75 mg/dL — ABNORMAL LOW (ref 0.76–1.27)
GFR calc Af Amer: 104 mL/min/{1.73_m2} (ref >59)
GFR calc non Af Amer: 90 mL/min/{1.73_m2} (ref >59)
Globulin, Total: 2.2 g/dL (ref 1.5–4.5)
Glucose: 80 mg/dL (ref 65–99)
Potassium: 4.3 mmol/L (ref 3.5–5.2)
Sodium: 139 mmol/L (ref 134–144)
Total Protein: 6.8 g/dL (ref 6.0–8.5)

## 2019-12-26 LAB — CBC WITH DIFFERENTIAL/PLATELET
Basophils Absolute: 0 10*3/uL (ref 0.0–0.2)
Basos: 1 %
EOS (ABSOLUTE): 0.1 10*3/uL (ref 0.0–0.4)
Eos: 2 %
Hematocrit: 46 % (ref 37.5–51.0)
Hemoglobin: 16.5 g/dL (ref 13.0–17.7)
Immature Grans (Abs): 0 10*3/uL (ref 0.0–0.1)
Immature Granulocytes: 0 %
Lymphocytes Absolute: 1.5 10*3/uL (ref 0.7–3.1)
Lymphs: 23 %
MCH: 32.4 pg (ref 26.6–33.0)
MCHC: 35.9 g/dL — ABNORMAL HIGH (ref 31.5–35.7)
MCV: 90 fL (ref 79–97)
Monocytes Absolute: 0.7 10*3/uL (ref 0.1–0.9)
Monocytes: 10 %
Neutrophils Absolute: 4.2 10*3/uL (ref 1.4–7.0)
Neutrophils: 64 %
Platelets: 202 10*3/uL (ref 150–450)
RBC: 5.09 x10E6/uL (ref 4.14–5.80)
RDW: 13.1 % (ref 11.6–15.4)
WBC: 6.5 10*3/uL (ref 3.4–10.8)

## 2019-12-26 LAB — LIPID PANEL
Chol/HDL Ratio: 2.8 ratio (ref 0.0–5.0)
Cholesterol, Total: 154 mg/dL (ref 100–199)
HDL: 56 mg/dL (ref >39)
LDL Chol Calc (NIH): 82 mg/dL (ref 0–99)
Triglycerides: 87 mg/dL (ref 0–149)
VLDL Cholesterol Cal: 16 mg/dL (ref 5–40)

## 2019-12-29 ENCOUNTER — Ambulatory Visit: Payer: Medicare Other

## 2020-01-02 ENCOUNTER — Ambulatory Visit (INDEPENDENT_AMBULATORY_CARE_PROVIDER_SITE_OTHER): Payer: Medicare Other | Admitting: *Deleted

## 2020-01-02 VITALS — Ht 68.0 in | Wt 173.5 lb

## 2020-01-02 DIAGNOSIS — Z0000 Encounter for general adult medical examination without abnormal findings: Secondary | ICD-10-CM | POA: Diagnosis not present

## 2020-01-02 NOTE — Patient Instructions (Signed)
  [facility] Maintenance Summary and Written Plan of Care  [name] ,  Thank you for allowing me to perform your Medicare Annual Wellness Visit and for your ongoing commitment to your health.   Health Maintenance & Immunization History Health Maintenance  Topic Date Due  . TETANUS/TDAP  Never done  . COLONOSCOPY  Never done  . PNA vac Low Risk Adult (1 of 2 - PCV13) Never done  . INFLUENZA VACCINE  04/04/2020  . COVID-19 Vaccine  Completed  . Hepatitis C Screening  Completed   Immunization History  Administered Date(s) Administered  . Fluad Quad(high Dose 65+) 06/06/2019  . Influenza, High Dose Seasonal PF 05/30/2018  . Moderna SARS-COVID-2 Vaccination 10/18/2019, 11/15/2019    These are the patient goals that we discussed: Goals Addressed            This Visit's Progress   . Exercise 3x per week (30 min per time)       01/02/2020 AWV Goal: Exercise for General Health   Patient will verbalize understanding of the benefits of increased physical activity:  Exercising regularly is important. It will improve your overall fitness, flexibility, and endurance.  Regular exercise also will improve your overall health. It can help you control your weight, reduce stress, and improve your bone density.  Over the next year, patient will increase physical activity as tolerated with a goal of at least 150 minutes of moderate physical activity per week.   You can tell that you are exercising at a moderate intensity if your heart starts beating faster and you start breathing faster but can still hold a conversation.  Moderate-intensity exercise ideas include:  Walking 1 mile (1.6 km) in about 15 minutes  Biking  Hiking  Golfing  Dancing  Water aerobics  Patient will verbalize understanding of everyday activities that increase physical activity by providing examples like the following: ? Yard work, such as: ? Pushing a Conservation officer, nature ? Raking and  bagging leaves ? Washing your car ? Pushing a stroller ? Shoveling snow ? Gardening ? Washing windows or floors  Patient will be able to explain general safety guidelines for exercising:   Before you start a new exercise program, talk with your health care provider.  Do not exercise so much that you hurt yourself, feel dizzy, or get very short of breath.  Wear comfortable clothes and wear shoes with good support.  Drink plenty of water while you exercise to prevent dehydration or heat stroke.  Work out until your breathing and your heartbeat get faster.         This is a list of Health Maintenance Items that are overdue or due now: Health Maintenance Due  Topic Date Due  . TETANUS/TDAP  Never done  . COLONOSCOPY  Never done  . PNA vac Low Risk Adult (1 of 2 - PCV13) Never done     Orders/Referrals Placed Today: No orders of the defined types were placed in this encounter.  (Contact our referral department at [phone] if you have not spoken with someone about your referral appointment within the next 5 days)    Follow-up Plan Follow up with Dr. Warrick Parisian as scheduled on 04/08/2020 Discuss Pneumonia, Tdap and Shingles Vaccine at office visit Discuss colon Cancer screening at office visit Bring in copy of Advance Directive to be scanned into you chart

## 2020-01-02 NOTE — Progress Notes (Addendum)
MEDICARE ANNUAL WELLNESS VISIT  01/02/2020  Telephone Visit Disclaimer This Medicare AWV was conducted by telephone due to national recommendations for restrictions regarding the COVID-19 Pandemic (e.g. social distancing).  I verified, using two identifiers, that I am speaking with David Schwartz or their authorized healthcare agent. I discussed the limitations, risks, security, and privacy concerns of performing an evaluation and management service by telephone and the potential availability of an in-person appointment in the future. The patient expressed understanding and agreed to proceed.   Subjective:  David Schwartz is a 74 y.o. male patient of David Schwartz, David Kaufmann, MD who had a Medicare Annual Wellness Visit today via telephone. David Schwartz is Retired and lives with their spouse. he has 2 children. he reports that he is socially active and does interact with friends/family regularly. he is minimally physically active and enjoys working on antique care and trucks.  Patient Care Team: David Schwartz, David Kaufmann, MD as PCP - General (Family Medicine) Lavera Guise, [facility] (Pharmacist)  Advanced Directives 01/02/2020  Does Patient Have a Medical Advance Directive? Yes  Type of Advance Directive Living will;Healthcare Power of Attorney  Does patient want to make changes to medical advance directive? No - Patient declined  Copy of [place] in Chart? No - copy requested    Hospital Utilization Over the Past 12 Months: # of hospitalizations or ER visits: 0 # of surgeries: 0  Review of Systems    Patient reports that his overall health is unchanged compared to last year.  History obtained from chart review and the patient General ROS: negative  Patient Reported Readings (BP, Pulse, CBG, Weight, etc) none  Pain Assessment Pain : No/denies pain     Current Medications & Allergies (verified) Allergies as of 01/02/2020   No Known Allergies     Medication List      Accurate as of January 02, 2020  2:57 PM. If you have any questions, ask your nurse or doctor.        alendronate 70 MG tablet Commonly known as: FOSAMAX TAKE 1 TABLET BY MOUTH ONCE A WEEK   apixaban 5 MG Tabs tablet Commonly known as: ELIQUIS Take 1 tablet (5 mg total) by mouth 2 (two) times daily.   aspirin EC 81 MG tablet Take 81 mg by mouth daily.   calcium carbonate 1500 (600 Ca) MG Tabs tablet Commonly known as: OSCAL Take by mouth daily with breakfast.   ketoconazole 2 % cream Commonly known as: NIZORAL Apply 1 application topically daily.   metoprolol succinate 25 MG 24 hr tablet Commonly known as: TOPROL-XL Take 1 tablet (25 mg total) by mouth daily.   rosuvastatin 5 MG tablet Commonly known as: CRESTOR TAKE 1 TABLET BY MOUTH  DAILY AT 2 PM.   terazosin 2 MG capsule Commonly known as: HYTRIN Take 1 capsule (2 mg total) by mouth daily at 2 PM.   terbinafine 250 MG tablet Commonly known as: LAMISIL Take 1 tablet by mouth once daily   Vitamin D3 25 MCG (1000 UT) Caps Take by mouth.       History (reviewed): Past Medical History:  Diagnosis Date  . Arthritis   . Hyperlipidemia   . Hypertension    Past Surgical History:  Procedure Laterality Date  . HERNIA REPAIR    . KNEE ARTHROSCOPY     Family History  Problem Relation Age of Onset  . Diabetes Mother   . Hypertension Mother   . Early death  Father   . Arthritis Maternal Grandmother   . Hypertension Maternal Grandfather   . Hypertension Paternal Grandmother   . Cancer Paternal Grandfather        lung   Social History   Socioeconomic History  . Marital status: Married    Spouse name: Katharine Look  . Number of children: Not on file  . Years of education: 79  . Highest education level: 12th grade  Occupational History  . Occupation: Retired  Tobacco Use  . Smoking status: Never Smoker  . Smokeless tobacco: Never Used  Substance and Sexual Activity  . Alcohol use: Yes    Comment: occ  .  Drug use: Never  . Sexual activity: Not on file  Other Topics Concern  . Not on file  Social History Narrative  . Not on file   Social Determinants of Health   Financial Resource Strain: Low Risk   . Difficulty of Paying Living Expenses: Not hard at all  Food Insecurity: No Food Insecurity  . Worried About Charity fundraiser in the Last Year: Never true  . Ran Out of Food in the Last Year: Never true  Transportation Needs: No Transportation Needs  . Lack of Transportation (Medical): No  . Lack of Transportation (Non-Medical): No  Physical Activity: Inactive  . Days of Exercise per Week: 0 days  . Minutes of Exercise per Session: 0 min  Stress: No Stress Concern Present  . Feeling of Stress : Not at all  Social Connections: Slightly Isolated  . Frequency of Communication with Friends and Family: More than three times a week  . Frequency of Social Gatherings with Friends and Family: More than three times a week  . Attends Religious Services: More than 4 times per year  . Active Member of Clubs or Organizations: No  . Attends Archivist Meetings: Never  . Marital Status: Married    Activities of Daily Living In your present state of health, do you have any difficulty performing the following activities: 01/02/2020  Hearing? N  Vision? N  Difficulty concentrating or making decisions? N  Walking or climbing stairs? N  Dressing or bathing? N  Doing errands, shopping? N  Preparing Food and eating ? N  Using the Toilet? N  In the past six months, have you accidently leaked urine? N  Do you have problems with loss of bowel control? N  Managing your Medications? N  Managing your Finances? N  Housekeeping or managing your Housekeeping? N  Some recent data might be hidden    Patient Education/ Literacy How often do you need to have someone help you when you read instructions, pamphlets, or other written materials from your doctor or pharmacy?: 1 - Never What is the  last grade level you completed in school?: 12th Grade, Vocational school  Exercise Current Exercise Habits: The patient does not participate in regular exercise at present, Exercise limited by: None identified  Diet Patient reports consuming 3 meals a day and 1 snack(s) a day Patient reports that his primary diet is: Regular Patient reports that she does have regular access to food.   Depression Screen PHQ 2/9 Scores 01/02/2020 12/25/2019 07/28/2019 07/15/2019 06/26/2019 06/13/2018 05/30/2018  PHQ - 2 Score 0 0 0 0 0 0 0  PHQ- 9 Score - - 0 - - - -     Fall Risk Fall Risk  01/02/2020 12/25/2019 07/15/2019 06/26/2019 06/13/2018  Falls in the past year? 0 0 0 0 No  Number falls  in past yr: 0 - - - -  Injury with Fall? 0 - - - -  Risk for fall due to : No Fall Risks - - - -  Follow up Falls evaluation completed - - Falls evaluation completed -     Objective:  Cardae Sapp seemed alert and oriented and he participated appropriately during our telephone visit.  Blood Pressure Weight BMI  BP Readings from Last 3 Encounters:  12/25/19 126/83  07/28/19 138/78  07/15/19 (!) 81/54   Wt Readings from Last 3 Encounters:  01/02/20 173 lb 8 oz (78.7 kg)  12/25/19 173 lb 8 oz (78.7 kg)  07/28/19 179 lb (81.2 kg)   BMI Readings from Last 1 Encounters:  01/02/20 26.38 kg/m    *Unable to obtain current vital signs, weight, and BMI due to telephone visit type  Hearing/Vision  . Merrick did not seem to have difficulty with hearing/understanding during the telephone conversation . Reports that he has not had a formal eye exam by an eye care professional within the past year . Reports that he has not had a formal hearing evaluation within the past year *Unable to fully assess hearing and vision during telephone visit type  Cognitive Function: 6CIT Screen 01/02/2020  What Year? 0 points  What month? 0 points  What time? 0 points  Count back from 20 0 points  Months in reverse 0 points   Repeat phrase 4 points  Total Score 4   (Normal:0-7, Significant for Dysfunction: >8)  Normal Cognitive Function Screening: Yes   Immunization & Health Maintenance Record Immunization History  Administered Date(s) Administered  . Fluad Quad(high Dose 65+) 06/06/2019  . Influenza, High Dose Seasonal PF 05/30/2018  . Moderna SARS-COVID-2 Vaccination 10/18/2019, 11/15/2019    Health Maintenance  Topic Date Due  . TETANUS/TDAP  Never done  . COLONOSCOPY  Never done  . PNA vac Low Risk Adult (1 of 2 - PCV13) Never done  . INFLUENZA VACCINE  04/04/2020  . COVID-19 Vaccine  Completed  . Hepatitis C Screening  Completed       Assessment  This is a routine wellness examination for Fergus Dettling.  Health Maintenance: Due or Overdue Health Maintenance Due  Topic Date Due  . TETANUS/TDAP  Never done  . COLONOSCOPY  Never done  . PNA vac Low Risk Adult (1 of 2 - PCV13) Never done    David Schwartz does not need a referral for Community Assistance: Care Management:   no Social Work:    no Prescription Assistance:  no Nutrition/Diabetes Education:  no   Plan:  Personalized Goals Goals Addressed            This Visit's Progress   . Exercise 3x per week (30 min per time)       01/02/2020 AWV Goal: Exercise for General Health   Patient will verbalize understanding of the benefits of increased physical activity:  Exercising regularly is important. It will improve your overall fitness, flexibility, and endurance.  Regular exercise also will improve your overall health. It can help you control your weight, reduce stress, and improve your bone density.  Over the next year, patient will increase physical activity as tolerated with a goal of at least 150 minutes of moderate physical activity per week.   You can tell that you are exercising at a moderate intensity if your heart starts beating faster and you start breathing faster but can still hold a conversation.   Moderate-intensity exercise  ideas include:  Walking 1 mile (1.6 km) in about 15 minutes  [place]  Dancing  Water aerobics  Patient will verbalize understanding of everyday activities that increase physical activity by providing examples like the following: ? Yard work, such as: ? Pushing a Conservation officer, nature ? Raking and bagging leaves ? Washing your car ? Pushing a stroller ? Shoveling snow ? Gardening ? Washing windows or floors  Patient will be able to explain general safety guidelines for exercising:   Before you start a new exercise program, talk with your health care provider.  Do not exercise so much that you hurt yourself, feel dizzy, or get very short of breath.  Wear comfortable clothes and wear shoes with good support.  Drink plenty of water while you exercise to prevent dehydration or heat stroke.  Work out until your breathing and your heartbeat get faster.       Personalized Health Maintenance & Screening Recommendations  Pneumococcal vaccine  Td vaccine Colorectal cancer screening Advanced directives: has an advanced directive - a copy HAS NOT been provided. Shingrix  Lung Cancer Screening Recommended: no (Low Dose CT Chest recommended if Age 56-80 years, 30 pack-year currently smoking OR have quit w/in past 15 years) Hepatitis C Screening recommended: Completed HIV Screening recommended: no  Advanced Directives: Written information was not prepared per patient's request.  Referrals & Orders No orders of the defined types were placed in this encounter.   Follow-up Plan . Follow-up with David Schwartz, David Kaufmann, MD as planned . Discuss Pneumonia, Tdap and Shingrix vaccines at OV . Discuss Colon Cancer Screening at next OV . Patient to bring in copy of Advance Directive to be scanned into chart   I have personally reviewed and noted the following in the patient's chart:   . Medical and social history . Use of alcohol, tobacco or illicit  drugs  . Current medications and supplements . Functional ability and status . Nutritional status . Physical activity . Advanced directives . List of other physicians . Hospitalizations, surgeries, and ER visits in previous 12 months . Vitals . Screenings to include cognitive, depression, and falls . Referrals and appointments  In addition, I have reviewed and discussed with David Schwartz certain preventive protocols, quality metrics, and best practice recommendations. A written personalized care plan for preventive services as well as general preventive health recommendations is available and can be mailed to the patient at his request.      Wardell Heath  01/02/2020  AVS Printed and Mailed to Patient     I have reviewed this encounter including the documentation in this note and/or discussed this patient with the provider. I am certifying that I agree with the content of this note as supervising physician.  Caryl Pina, MD [facility] 01/05/2020, 9:26 PM

## 2020-01-05 ENCOUNTER — Other Ambulatory Visit: Payer: Self-pay

## 2020-01-05 ENCOUNTER — Encounter: Payer: Self-pay | Admitting: Family Medicine

## 2020-01-05 ENCOUNTER — Ambulatory Visit: Payer: Medicare Other | Attending: Internal Medicine

## 2020-01-05 ENCOUNTER — Ambulatory Visit (INDEPENDENT_AMBULATORY_CARE_PROVIDER_SITE_OTHER): Payer: Medicare Other | Admitting: Family Medicine

## 2020-01-05 DIAGNOSIS — J329 Chronic sinusitis, unspecified: Secondary | ICD-10-CM | POA: Diagnosis not present

## 2020-01-05 DIAGNOSIS — Z20822 Contact with and (suspected) exposure to covid-19: Secondary | ICD-10-CM

## 2020-01-05 MED ORDER — AZITHROMYCIN 250 MG PO TABS: ORAL_TABLET | ORAL | 0 refills | Status: DC

## 2020-01-05 MED ORDER — BENZONATATE 100 MG PO CAPS: 100.0000 mg | ORAL_CAPSULE | Freq: Two times a day (BID) | ORAL | 0 refills | Status: DC | PRN

## 2020-01-05 NOTE — Progress Notes (Signed)
Virtual Visit via telephone Note  I connected with David Schwartz on 01/05/20 at 0900 by telephone and verified that I am speaking with the correct person using two identifiers. David Schwartz is currently located at home and no other people are currently with her during visit. The provider, Fransisca Kaufmann Quention Mcneill, MD is located in their office at time of visit.  Call ended at 0913  I discussed the limitations, risks, security and privacy concerns of performing an evaluation and management service by telephone and the availability of in person appointments. I also discussed with the patient that there may be a patient responsible charge related to this service. The patient expressed understanding and agreed to proceed.   History and Present Illness: Patient is calling in for cough and fever and sinus pressure that started 5 days ago and the cough is dry.  He is started to have productive cough and he has dry heaving.  His temperature of 100.1 last night and this morning.  He is having coughing spells especially at night.  He has sinus drainage runny nose but no headaches.  He was cutting grass last week and that is when it started and backed up since then.    No diagnosis found.  Outpatient Encounter Medications as of 01/05/2020  Medication Sig  . alendronate (FOSAMAX) 70 MG tablet TAKE 1 TABLET BY MOUTH ONCE A WEEK  . apixaban (ELIQUIS) 5 MG TABS tablet Take 1 tablet (5 mg total) by mouth 2 (two) times daily.  Marland Kitchen aspirin EC 81 MG tablet Take 81 mg by mouth daily.  . calcium carbonate (OSCAL) 1500 (600 Ca) MG TABS tablet Take by mouth daily with breakfast.  . Cholecalciferol (VITAMIN D3) 1000 units CAPS Take by mouth.  Marland Kitchen ketoconazole (NIZORAL) 2 % cream Apply 1 application topically daily.  . metoprolol succinate (TOPROL-XL) 25 MG 24 hr tablet Take 1 tablet (25 mg total) by mouth daily.  . rosuvastatin (CRESTOR) 5 MG tablet TAKE 1 TABLET BY MOUTH  DAILY AT 2 PM.  . terazosin (HYTRIN) 2 MG  capsule Take 1 capsule (2 mg total) by mouth daily at 2 PM.  . terbinafine (LAMISIL) 250 MG tablet Take 1 tablet by mouth once daily   No facility-administered encounter medications on file as of 01/05/2020.    Review of Systems  Constitutional: Positive for fever. Negative for chills.  HENT: Positive for congestion, postnasal drip, rhinorrhea, sinus pressure, sneezing and sore throat. Negative for ear discharge, ear pain and voice change.   Eyes: Negative for pain, discharge, redness and visual disturbance.  Respiratory: Positive for cough. Negative for shortness of breath and wheezing.   Cardiovascular: Negative for chest pain and leg swelling.  Skin: Negative for rash.  All other systems reviewed and are negative.   Observations/Objective: Patient sounds comfortable and in no acute distress.  Assessment and Plan: Problem List Items Addressed This Visit    None    Visit Diagnoses    Sinusitis, unspecified chronicity, unspecified location    -  Primary   Relevant Medications   azithromycin (ZITHROMAX) 250 MG tablet   benzonatate (TESSALON) 100 MG capsule      He is vaccinated against covid in march. He is recommended for testing and will treat for symptoms He is having symptoms of allergies Follow up plan: Return if symptoms worsen or fail to improve.     I discussed the assessment and treatment plan with the patient. The patient was provided an opportunity to ask  questions and all were answered. The patient agreed with the plan and demonstrated an understanding of the instructions.   The patient was advised to call back or seek an in-person evaluation if the symptoms worsen or if the condition fails to improve as anticipated.  The above assessment and management plan was discussed with the patient. The patient verbalized understanding of and has agreed to the management plan. Patient is aware to call the clinic if symptoms persist or worsen. Patient is aware when to return  to the clinic for a follow-up visit. Patient educated on when it is appropriate to go to the emergency department.    I provided 13 minutes of non-face-to-face time during this encounter.    Worthy Rancher, MD

## 2020-01-07 LAB — NOVEL CORONAVIRUS, NAA: SARS-CoV-2, NAA: NOT DETECTED

## 2020-01-07 LAB — SARS-COV-2, NAA 2 DAY TAT

## 2020-01-08 ENCOUNTER — Telehealth: Payer: Self-pay | Admitting: Family Medicine

## 2020-01-09 ENCOUNTER — Ambulatory Visit: Payer: Medicare Other | Admitting: Cardiology

## 2020-01-09 ENCOUNTER — Encounter: Payer: Self-pay | Admitting: Cardiology

## 2020-01-09 ENCOUNTER — Other Ambulatory Visit: Payer: Self-pay

## 2020-01-09 ENCOUNTER — Other Ambulatory Visit: Payer: Self-pay | Admitting: Cardiology

## 2020-01-09 VITALS — BP 110/80 | HR 106 | Ht 68.0 in | Wt 168.6 lb

## 2020-01-09 DIAGNOSIS — R000 Tachycardia, unspecified: Secondary | ICD-10-CM | POA: Diagnosis not present

## 2020-01-09 DIAGNOSIS — I4891 Unspecified atrial fibrillation: Secondary | ICD-10-CM | POA: Diagnosis not present

## 2020-01-09 MED ORDER — METOPROLOL SUCCINATE ER 50 MG PO TB24: 50.0000 mg | ORAL_TABLET | Freq: Every day | ORAL | 6 refills | Status: DC

## 2020-01-09 NOTE — Patient Instructions (Signed)
Medication Instructions:   Increase Toprol XL to 50mg  daily.   Stop Aspirin.  Continue all other medications.    Labwork: none  Testing/Procedures:  Your physician has requested that you have an echocardiogram. Echocardiography is a painless test that uses sound waves to create images of your heart. It provides your doctor with information about the size and shape of your heart and how well your heart's chambers and valves are working. This procedure takes approximately one hour. There are no restrictions for this procedure.  Your physician has recommended that you have a Cardioversion (DCCV). Electrical Cardioversion uses a jolt of electricity to your heart either through paddles or wired patches attached to your chest. This is a controlled, usually prescheduled, procedure. Defibrillation is done under light anesthesia in the hospital, and you usually go home the day of the procedure. This is done to get your heart back into a normal rhythm. You are not awake for the procedure. Please see the instruction sheet given to you today.  Follow-Up: 6 weeks   Any Other Special Instructions Will Be Listed Below (If Applicable).  If you need a refill on your cardiac medications before your next appointment, please call your pharmacy.

## 2020-01-09 NOTE — Progress Notes (Signed)
Clinical Summary David Schwartz is a 74 y.o.male seen today as a new consult, referred by Dr Dettinger for the following medical problems.   1. Afib - new diagnosis noted by pcp during 12/25/19 visit - ekg from pcp reviewed, afib rate 108 - CHADS 2vasc score is at least 2 (age, HTN), started on eliquis and toprol 25mg    - no palpitations - nonspecific fatigue with activity. No SOB or DOE - started eliquis 12/26/19     SH: leaving next week, coming back May 20th. Travelling to [place] for grandsons high schoold graduation  Past Medical History:  Diagnosis Date  . Arthritis   . Hyperlipidemia   . Hypertension      No Known Allergies   Current Outpatient Medications  Medication Sig Dispense Refill  . alendronate (FOSAMAX) 70 MG tablet TAKE 1 TABLET BY MOUTH ONCE A WEEK 12 tablet 3  . apixaban (ELIQUIS) 5 MG TABS tablet Take 1 tablet (5 mg total) by mouth 2 (two) times daily. 60 tablet 3  . aspirin EC 81 MG tablet Take 81 mg by mouth daily.    Marland Kitchen azithromycin (ZITHROMAX) 250 MG tablet Take 2 the first day and then one each day after. 6 tablet 0  . benzonatate (TESSALON) 100 MG capsule Take 1 capsule (100 mg total) by mouth 2 (two) times daily as needed for cough. 20 capsule 0  . calcium carbonate (OSCAL) 1500 (600 Ca) MG TABS tablet Take by mouth daily with breakfast.    . Cholecalciferol (VITAMIN D3) 1000 units CAPS Take by mouth.    Marland Kitchen ketoconazole (NIZORAL) 2 % cream Apply 1 application topically daily. 60 g 1  . metoprolol succinate (TOPROL-XL) 25 MG 24 hr tablet Take 1 tablet (25 mg total) by mouth daily. 90 tablet 3  . rosuvastatin (CRESTOR) 5 MG tablet TAKE 1 TABLET BY MOUTH  DAILY AT 2 PM. 90 tablet 0  . terazosin (HYTRIN) 2 MG capsule Take 1 capsule (2 mg total) by mouth daily at 2 PM. 90 capsule 3  . terbinafine (LAMISIL) 250 MG tablet Take 1 tablet by mouth once daily 90 tablet 0   No current facility-administered medications for this visit.     Past Surgical  History:  Procedure Laterality Date  . HERNIA REPAIR    . KNEE ARTHROSCOPY       No Known Allergies    Family History  Problem Relation Age of Onset  . Diabetes Mother   . Hypertension Mother   . Early death Father   . Arthritis Maternal Grandmother   . Hypertension Maternal Grandfather   . Hypertension Paternal Grandmother   . Cancer Paternal Grandfather        lung     Social History David Schwartz reports that he has never smoked. He has never used smokeless tobacco. David Schwartz reports current alcohol use.   Review of Systems CONSTITUTIONAL: No weight loss, fever, chills, weakness or fatigue.  HEENT: Eyes: No visual loss, blurred vision, double vision or yellow sclerae.No hearing loss, sneezing, congestion, runny nose or sore throat.  SKIN: No rash or itching.  CARDIOVASCULAR: per hpi RESPIRATORY: No shortness of breath, cough or sputum.  GASTROINTESTINAL: No anorexia, nausea, vomiting or diarrhea. No abdominal pain or blood.  GENITOURINARY: No burning on urination, no polyuria NEUROLOGICAL: No headache, dizziness, syncope, paralysis, ataxia, numbness or tingling in the extremities. No change in bowel or bladder control.  MUSCULOSKELETAL: No muscle, back pain, joint pain or stiffness.  LYMPHATICS: No  enlarged nodes. No history of splenectomy.  PSYCHIATRIC: No history of depression or anxiety.  ENDOCRINOLOGIC: No reports of sweating, cold or heat intolerance. No polyuria or polydipsia.  Marland Kitchen   Physical Examination Today's Vitals   01/09/20 1310  BP: 110/80  Pulse: (!) 106  SpO2: 98%  Weight: 168 lb 9.6 oz (76.5 kg)  Height: 5\' 8"  (1.727 m)   Body mass index is 25.64 kg/m.  Gen: resting comfortably, no acute distress HEENT: no scleral icterus, pupils equal round and reactive, no palptable cervical adenopathy,  CV: irreg, tachy, no m/r/g Resp: Clear to auscultation bilaterally GI: abdomen is soft, non-tender, non-distended, normal bowel sounds, no  hepatosplenomegaly MSK: extremities are warm, no edema.  Skin: warm, no rash Neuro:  no focal deficits Psych: appropriate affect      Assessment and Plan  1. Afib - new diagnosis by pcp 12/25/19 - limited symptoms, perhaps some fatigue but unclear if related -EKG today remains in afib, rates remain elevated increase toprol to 50mg  daily - stop ASA, continue eliquis  - obtain echo - plan for DCCV in next few weeks, he started eliquis 12/25/19, needs at least 3 weeks, also going out of town next week        Arnoldo Lenis, M.D.

## 2020-01-09 NOTE — Telephone Encounter (Signed)
Patient with complaints of lingering cough (dry cough).  Denies fever, chills, or other S/Sx of infection.  He has been takign Z-pak as prescribed.  He feels better overall, however the cough remains and he cannot get relief at night.  Occasionally spits up with "red specks".  He reports taking tessalon as prescribed with no relief.  Recommendedg Delsym OTC for cough.  Treat allergy symptoms with zyrtec or benadryl.  Discussed with PCP  Instructed patient to call if no improvement in the upcoming days and to keep an eye out for S/SX of bleeding (on Eliquis)

## 2020-01-09 NOTE — H&P (View-Only) (Signed)
Clinical Summary Mr. Garringer is a 74 y.o.male seen today as a new consult, referred by Dr Dettinger for the following medical problems.   1. Afib - new diagnosis noted by pcp during 12/25/19 visit - ekg from pcp reviewed, afib rate 108 - CHADS 2vasc score is at least 2 (age, HTN), started on eliquis and toprol 25mg    - no palpitations - nonspecific fatigue with activity. No SOB or DOE - started eliquis 12/26/19     SH: leaving next week, coming back May 20th. Travelling to [place] for grandsons high schoold graduation  Past Medical History:  Diagnosis Date  . Arthritis   . Hyperlipidemia   . Hypertension      No Known Allergies   Current Outpatient Medications  Medication Sig Dispense Refill  . alendronate (FOSAMAX) 70 MG tablet TAKE 1 TABLET BY MOUTH ONCE A WEEK 12 tablet 3  . apixaban (ELIQUIS) 5 MG TABS tablet Take 1 tablet (5 mg total) by mouth 2 (two) times daily. 60 tablet 3  . aspirin EC 81 MG tablet Take 81 mg by mouth daily.    Marland Kitchen azithromycin (ZITHROMAX) 250 MG tablet Take 2 the first day and then one each day after. 6 tablet 0  . benzonatate (TESSALON) 100 MG capsule Take 1 capsule (100 mg total) by mouth 2 (two) times daily as needed for cough. 20 capsule 0  . calcium carbonate (OSCAL) 1500 (600 Ca) MG TABS tablet Take by mouth daily with breakfast.    . Cholecalciferol (VITAMIN D3) 1000 units CAPS Take by mouth.    Marland Kitchen ketoconazole (NIZORAL) 2 % cream Apply 1 application topically daily. 60 g 1  . metoprolol succinate (TOPROL-XL) 25 MG 24 hr tablet Take 1 tablet (25 mg total) by mouth daily. 90 tablet 3  . rosuvastatin (CRESTOR) 5 MG tablet TAKE 1 TABLET BY MOUTH  DAILY AT 2 PM. 90 tablet 0  . terazosin (HYTRIN) 2 MG capsule Take 1 capsule (2 mg total) by mouth daily at 2 PM. 90 capsule 3  . terbinafine (LAMISIL) 250 MG tablet Take 1 tablet by mouth once daily 90 tablet 0   No current facility-administered medications for this visit.     Past Surgical  History:  Procedure Laterality Date  . HERNIA REPAIR    . KNEE ARTHROSCOPY       No Known Allergies    Family History  Problem Relation Age of Onset  . Diabetes Mother   . Hypertension Mother   . Early death Father   . Arthritis Maternal Grandmother   . Hypertension Maternal Grandfather   . Hypertension Paternal Grandmother   . Cancer Paternal Grandfather        lung     Social History Mr. Cowherd reports that he has never smoked. He has never used smokeless tobacco. Mr. Maggiore reports current alcohol use.   Review of Systems CONSTITUTIONAL: No weight loss, fever, chills, weakness or fatigue.  HEENT: Eyes: No visual loss, blurred vision, double vision or yellow sclerae.No hearing loss, sneezing, congestion, runny nose or sore throat.  SKIN: No rash or itching.  CARDIOVASCULAR: per hpi RESPIRATORY: No shortness of breath, cough or sputum.  GASTROINTESTINAL: No anorexia, nausea, vomiting or diarrhea. No abdominal pain or blood.  GENITOURINARY: No burning on urination, no polyuria NEUROLOGICAL: No headache, dizziness, syncope, paralysis, ataxia, numbness or tingling in the extremities. No change in bowel or bladder control.  MUSCULOSKELETAL: No muscle, back pain, joint pain or stiffness.  LYMPHATICS: No  enlarged nodes. No history of splenectomy.  PSYCHIATRIC: No history of depression or anxiety.  ENDOCRINOLOGIC: No reports of sweating, cold or heat intolerance. No polyuria or polydipsia.  Marland Kitchen   Physical Examination Today's Vitals   01/09/20 1310  BP: 110/80  Pulse: (!) 106  SpO2: 98%  Weight: 168 lb 9.6 oz (76.5 kg)  Height: 5\' 8"  (1.727 m)   Body mass index is 25.64 kg/m.  Gen: resting comfortably, no acute distress HEENT: no scleral icterus, pupils equal round and reactive, no palptable cervical adenopathy,  CV: irreg, tachy, no m/r/g Resp: Clear to auscultation bilaterally GI: abdomen is soft, non-tender, non-distended, normal bowel sounds, no  hepatosplenomegaly MSK: extremities are warm, no edema.  Skin: warm, no rash Neuro:  no focal deficits Psych: appropriate affect      Assessment and Plan  1. Afib - new diagnosis by pcp 12/25/19 - limited symptoms, perhaps some fatigue but unclear if related -EKG today remains in afib, rates remain elevated increase toprol to 50mg  daily - stop ASA, continue eliquis  - obtain echo - plan for DCCV in next few weeks, he started eliquis 12/25/19, needs at least 3 weeks, also going out of town next week        Arnoldo Lenis, M.D.

## 2020-01-15 ENCOUNTER — Telehealth: Payer: Self-pay | Admitting: Family Medicine

## 2020-01-15 NOTE — Telephone Encounter (Signed)
Pts wife returned missed call from [place]. Reviewed [facility] [employer] note with pt's wife. Wife voiced understanding regarding Eliquis and other meds.

## 2020-01-15 NOTE — Telephone Encounter (Signed)
No answer from home phone or mobile Left message on mobile explaining how to take Eliquis Informed patient to spread out medication every 12 hours (Eliquis)--if medication are a couple hours off, it will still be in his system.   Other medications are once daily and are okay. Encouraged patient to call if additional needs arise.  Regina Eck, PharmD, BCPS Clinical Pharmacist, [facility] Phone [phone]

## 2020-01-26 ENCOUNTER — Encounter: Payer: Self-pay | Admitting: *Deleted

## 2020-01-27 ENCOUNTER — Other Ambulatory Visit (HOSPITAL_COMMUNITY)
Admission: RE | Admit: 2020-01-27 | Discharge: 2020-01-27 | Disposition: A | Discharge status: Home / Self Care | Payer: Medicare Other | Source: Ambulatory Visit | Attending: Cardiology | Admitting: Cardiology

## 2020-01-27 ENCOUNTER — Other Ambulatory Visit: Payer: Self-pay | Admitting: Cardiology

## 2020-01-27 ENCOUNTER — Telehealth: Payer: Self-pay | Admitting: Cardiology

## 2020-01-27 ENCOUNTER — Other Ambulatory Visit: Payer: Self-pay

## 2020-01-27 ENCOUNTER — Other Ambulatory Visit: Payer: Self-pay | Admitting: Family Medicine

## 2020-01-27 ENCOUNTER — Encounter (HOSPITAL_COMMUNITY): Payer: Self-pay

## 2020-01-27 ENCOUNTER — Encounter (HOSPITAL_COMMUNITY)
Admission: RE | Admit: 2020-01-27 | Discharge: 2020-01-27 | Disposition: A | Discharge status: Home / Self Care | Payer: Medicare Other | Source: Ambulatory Visit | Attending: Cardiology | Admitting: Cardiology

## 2020-01-27 DIAGNOSIS — Z20822 Contact with and (suspected) exposure to covid-19: Secondary | ICD-10-CM | POA: Diagnosis not present

## 2020-01-27 DIAGNOSIS — I10 Essential (primary) hypertension: Secondary | ICD-10-CM

## 2020-01-27 DIAGNOSIS — Z01812 Encounter for preprocedural laboratory examination: Secondary | ICD-10-CM | POA: Diagnosis not present

## 2020-01-27 DIAGNOSIS — E782 Mixed hyperlipidemia: Secondary | ICD-10-CM

## 2020-01-27 LAB — BASIC METABOLIC PANEL
Anion gap: 11 (ref 5–15)
BUN: 20 mg/dL (ref 8–23)
CO2: 25 mmol/L (ref 22–32)
Calcium: 8.8 mg/dL — ABNORMAL LOW (ref 8.9–10.3)
Chloride: 100 mmol/L (ref 98–111)
Creatinine, Ser: 0.84 mg/dL (ref 0.61–1.24)
GFR calc Af Amer: >60 mL/min (ref >60)
GFR calc non Af Amer: >60 mL/min (ref >60)
Glucose, Bld: 120 mg/dL — ABNORMAL HIGH (ref 70–99)
Potassium: 3.9 mmol/L (ref 3.5–5.1)
Sodium: 136 mmol/L (ref 135–145)

## 2020-01-27 NOTE — Patient Instructions (Signed)
David Schwartz  01/27/2020     @PREFPERIOPPHARMACY @   Your procedure is scheduled on   01/29/2020   Report to [facility] at  0900  A.M.  Call this number if you have problems the morning of surgery:  [phone]   Remember:  Do not eat or drink after midnight.                       Take these medicines the morning of surgery with A SIP OF WATER None. DO NOT miss any doses of eliquis before your procedure.    Do not wear jewelry, make-up or nail polish.  Do not wear lotions, powders, or perfumes. Please wear deodorant and brush your teeth.  Do not shave 48 hours prior to surgery.  Men may shave face and neck.  Do not bring valuables to the hospital.  [facility] is not responsible for any belongings or valuables.  Contacts, dentures or bridgework may not be worn into surgery.  Leave your suitcase in the car.  After surgery it may be brought to your room.  For patients admitted to the hospital, discharge time will be determined by your treatment team.  Patients discharged the day of surgery will not be allowed to drive home.   Name and phone number of your driver:   family Special instructions:  DO NOT smoke the morning of your procedure.  Please read over the following fact sheets that you were given. Anesthesia Post-op Instructions and Care and Recovery After Surgery       Electrical Cardioversion Electrical cardioversion is the delivery of a jolt of electricity to restore a normal rhythm to the heart. A rhythm that is too fast or is not regular keeps the heart from pumping well. In this procedure, sticky patches or metal paddles are placed on the chest to deliver electricity to the heart from a device. This procedure may be done in an emergency if:  There is low or no blood pressure as a result of the heart rhythm.  Normal rhythm must be restored as fast as possible to protect the brain and heart from further damage.  It may save a life. This may also be  a scheduled procedure for irregular or fast heart rhythms that are not immediately life-threatening. Tell a health care provider about:  Any allergies you have.  All medicines you are taking, including vitamins, herbs, eye drops, creams, and over-the-counter medicines.  Any problems you or family members have had with anesthetic medicines.  Any blood disorders you have.  Any surgeries you have had.  Any medical conditions you have.  Whether you are pregnant or may be pregnant. What are the risks? Generally, this is a safe procedure. However, problems may occur, including:  Allergic reactions to medicines.  A blood clot that breaks free and travels to other parts of your body.  The possible return of an abnormal heart rhythm within hours or days after the procedure.  Your heart stopping (cardiac arrest). This is rare. What happens before the procedure? Medicines  Your health care provider may have you start taking: ? Blood-thinning medicines (anticoagulants) so your blood does not clot as easily. ? Medicines to help stabilize your heart rate and rhythm.  Ask your health care provider about: ? Changing or stopping your regular medicines. This is especially important if you are taking diabetes medicines or blood thinners. ? Taking medicines such as aspirin and ibuprofen.  These medicines can thin your blood. Do not take these medicines unless your health care provider tells you to take them. ? Taking over-the-counter medicines, vitamins, herbs, and supplements. General instructions  Follow instructions from your health care provider about eating or drinking restrictions.  Plan to have someone take you home from the hospital or clinic.  If you will be going home right after the procedure, plan to have someone with you for 24 hours.  Ask your health care provider what steps will be taken to help prevent infection. These may include washing your skin with a germ-killing  soap. What happens during the procedure?   An IV will be inserted into one of your veins.  Sticky patches (electrodes) or metal paddles may be placed on your chest.  You will be given a medicine to help you relax (sedative).  An electrical shock will be delivered. The procedure may vary among health care providers and hospitals. What can I expect after the procedure?  Your blood pressure, heart rate, breathing rate, and blood oxygen level will be monitored until you leave the hospital or clinic.  Your heart rhythm will be watched to make sure it does not change.  You may have some redness on the skin where the shocks were given. Follow these instructions at home:  Do not drive for 24 hours if you were given a sedative during your procedure.  Take over-the-counter and prescription medicines only as told by your health care provider.  Ask your health care provider how to check your pulse. Check it often.  Rest for 48 hours after the procedure or as told by your health care provider.  Avoid or limit your caffeine use as told by your health care provider.  Keep all follow-up visits as told by your health care provider. This is important. Contact a health care provider if:  You feel like your heart is beating too quickly or your pulse is not regular.  You have a serious muscle cramp that does not go away. Get help right away if:  You have discomfort in your chest.  You are dizzy or you feel faint.  You have trouble breathing or you are short of breath.  Your speech is slurred.  You have trouble moving an arm or leg on one side of your body.  Your fingers or toes turn cold or blue. Summary  Electrical cardioversion is the delivery of a jolt of electricity to restore a normal rhythm to the heart.  This procedure may be done right away in an emergency or may be a scheduled procedure if the condition is not an emergency.  Generally, this is a safe procedure.  After  the procedure, check your pulse often as told by your health care provider. This information is not intended to replace advice given to you by your health care provider. Make sure you discuss any questions you have with your health care provider. Document Revised: 03/24/2019 Document Reviewed: 03/24/2019 Elsevier Patient Education  [place] After These instructions provide you with information about caring for yourself after your procedure. Your health care provider may also give you more specific instructions. Your treatment has been planned according to current medical practices, but problems sometimes occur. Call your health care provider if you have any problems or questions after your procedure. What can I expect after the procedure? After your procedure, you may:  Feel sleepy for several hours.  Feel clumsy and have poor balance for  several hours.  Feel forgetful about what happened after the procedure.  Have poor judgment for several hours.  Feel nauseous or vomit.  Have a sore throat if you had a breathing tube during the procedure. Follow these instructions at home: For at least 24 hours after the procedure:      Have a responsible adult stay with you. It is important to have someone help care for you until you are awake and alert.  Rest as needed.  Do not: ? Participate in activities in which you could fall or become injured. ? Drive. ? Use heavy machinery. ? Drink alcohol. ? Take sleeping pills or medicines that cause drowsiness. ? Make important decisions or sign legal documents. ? Take care of children on your own. Eating and drinking  Follow the diet that is recommended by your health care provider.  If you vomit, drink water, juice, or soup when you can drink without vomiting.  Make sure you have little or no nausea before eating solid foods. General instructions  Take over-the-counter and prescription medicines  only as told by your health care provider.  If you have sleep apnea, surgery and certain medicines can increase your risk for breathing problems. Follow instructions from your health care provider about wearing your sleep device: ? Anytime you are sleeping, including during daytime naps. ? While taking prescription pain medicines, sleeping medicines, or medicines that make you drowsy.  If you smoke, do not smoke without supervision.  Keep all follow-up visits as told by your health care provider. This is important. Contact a health care provider if:  You keep feeling nauseous or you keep vomiting.  You feel light-headed.  You develop a rash.  You have a fever. Get help right away if:  You have trouble breathing. Summary  For several hours after your procedure, you may feel sleepy and have poor judgment.  Have a responsible adult stay with you for at least 24 hours or until you are awake and alert. This information is not intended to replace advice given to you by your health care provider. Make sure you discuss any questions you have with your health care provider. Document Revised: 11/19/2017 Document Reviewed: 12/12/2015 Elsevier Patient Education  [place].

## 2020-01-27 NOTE — Telephone Encounter (Signed)
Needs to know what he is suppose to be doing today. David Schwartz left a message but he has not been able to access his voicemail to find out.

## 2020-01-27 NOTE — Telephone Encounter (Signed)
Pre-cert Verification for the following procedure    DCCV - Thursday, 01/29/2020 - 10:30 am - to arrive at 9:00 am to short stay.

## 2020-01-27 NOTE — Telephone Encounter (Signed)
Patient informed of pre-op & DCCV dates & times.  Instructions given as well.

## 2020-01-28 ENCOUNTER — Ambulatory Visit (INDEPENDENT_AMBULATORY_CARE_PROVIDER_SITE_OTHER): Payer: Medicare Other

## 2020-01-28 DIAGNOSIS — I4891 Unspecified atrial fibrillation: Secondary | ICD-10-CM | POA: Diagnosis not present

## 2020-01-28 LAB — SARS CORONAVIRUS 2 (TAT 6-24 HRS): SARS Coronavirus 2: NEGATIVE

## 2020-01-29 ENCOUNTER — Telehealth: Payer: Self-pay | Admitting: *Deleted

## 2020-01-29 ENCOUNTER — Ambulatory Visit (HOSPITAL_COMMUNITY)
Admission: RE | Admit: 2020-01-29 | Discharge: 2020-01-29 | Disposition: A | Discharge status: Home / Self Care | Payer: Medicare Other | Attending: Cardiology | Admitting: Cardiology

## 2020-01-29 ENCOUNTER — Ambulatory Visit (HOSPITAL_COMMUNITY): Payer: Medicare Other | Admitting: Anesthesiology

## 2020-01-29 ENCOUNTER — Encounter (HOSPITAL_COMMUNITY): Payer: Self-pay | Admitting: Cardiology

## 2020-01-29 ENCOUNTER — Encounter (HOSPITAL_COMMUNITY)
Admission: RE | Disposition: A | Discharge status: Home / Self Care | Payer: Medicare Other | Source: Home / Self Care | Attending: Cardiology

## 2020-01-29 DIAGNOSIS — Z79899 Other long term (current) drug therapy: Secondary | ICD-10-CM | POA: Insufficient documentation

## 2020-01-29 DIAGNOSIS — Z7901 Long term (current) use of anticoagulants: Secondary | ICD-10-CM | POA: Insufficient documentation

## 2020-01-29 DIAGNOSIS — E785 Hyperlipidemia, unspecified: Secondary | ICD-10-CM | POA: Diagnosis not present

## 2020-01-29 DIAGNOSIS — Z7982 Long term (current) use of aspirin: Secondary | ICD-10-CM | POA: Diagnosis not present

## 2020-01-29 DIAGNOSIS — I4819 Other persistent atrial fibrillation: Secondary | ICD-10-CM | POA: Diagnosis not present

## 2020-01-29 DIAGNOSIS — I4891 Unspecified atrial fibrillation: Secondary | ICD-10-CM | POA: Diagnosis not present

## 2020-01-29 DIAGNOSIS — I4821 Permanent atrial fibrillation: Secondary | ICD-10-CM | POA: Diagnosis present

## 2020-01-29 DIAGNOSIS — Z8249 Family history of ischemic heart disease and other diseases of the circulatory system: Secondary | ICD-10-CM | POA: Diagnosis not present

## 2020-01-29 DIAGNOSIS — M1990 Unspecified osteoarthritis, unspecified site: Secondary | ICD-10-CM | POA: Diagnosis not present

## 2020-01-29 DIAGNOSIS — I10 Essential (primary) hypertension: Secondary | ICD-10-CM | POA: Diagnosis not present

## 2020-01-29 HISTORY — PX: CARDIOVERSION: SHX1299

## 2020-01-29 SURGERY — CARDIOVERSION
Anesthesia: General

## 2020-01-29 MED ORDER — CHLORHEXIDINE GLUCONATE 0.12 % MT SOLN: 15.0000 mL | Freq: Once | OROMUCOSAL | Status: DC

## 2020-01-29 MED ORDER — ORAL CARE MOUTH RINSE: 15.0000 mL | Freq: Once | OROMUCOSAL | Status: DC

## 2020-01-29 MED ORDER — LACTATED RINGERS IV SOLN
Freq: Once | INTRAVENOUS | Status: AC
  Administered 2020-01-29: 1000 mL via INTRAVENOUS

## 2020-01-29 MED ORDER — LACTATED RINGERS IV SOLN: INTRAVENOUS | Status: DC | PRN

## 2020-01-29 MED ORDER — PROPOFOL 10 MG/ML IV BOLUS
INTRAVENOUS | Status: DC | PRN
  Administered 2020-01-29 (×2): 50 mg via INTRAVENOUS

## 2020-01-29 NOTE — Telephone Encounter (Signed)
-----   Message from Arnoldo Lenis, MD sent at 01/28/2020 10:07 AM EDT ----- Normal labs, negative covid test   Zandra Abts MD

## 2020-01-29 NOTE — Anesthesia Postprocedure Evaluation (Signed)
Anesthesia Post Note  Patient: David Schwartz  Procedure(s) Performed: CARDIOVERSION (N/A )  Patient location during evaluation: PACU Anesthesia Type: General Level of consciousness: awake and alert and oriented Pain management: pain level controlled Vital Signs Assessment: post-procedure vital signs reviewed and stable Respiratory status: spontaneous breathing Cardiovascular status: blood pressure returned to baseline and stable Postop Assessment: no apparent nausea or vomiting Anesthetic complications: no     Last Vitals:  Vitals:   01/29/20 0928 01/29/20 0930  BP:  (!) 158/107  Pulse: (!) 103   Resp: 19   Temp:  37.1 C  SpO2: 99%     Last Pain:  Vitals:   01/29/20 0928  TempSrc: Oral  PainSc: 0-No pain                 Samule Life

## 2020-01-29 NOTE — Telephone Encounter (Signed)
Pt procedure today

## 2020-01-29 NOTE — Transfer of Care (Signed)
Immediate Anesthesia Transfer of Care Note  Patient: David Schwartz  Procedure(s) Performed: CARDIOVERSION (N/A )  Patient Location: PACU  Anesthesia Type:General  Level of Consciousness: awake  Airway & Oxygen Therapy: Patient Spontanous Breathing  Post-op Assessment: Report given to RN  Post vital signs: Reviewed  Last Vitals:  Vitals Value Taken Time  BP    Temp    Pulse    Resp    SpO2      Last Pain:  Vitals:   01/29/20 0928  TempSrc: Oral  PainSc: 0-No pain      Patients Stated Pain Goal: 6 ([id number])  Complications: No apparent anesthesia complications

## 2020-01-29 NOTE — CV Procedure (Signed)
Elective direct-current cardioversion  Indication: Persistent atrial fibrillation  Description of procedure: After informed consent was obtained the patient was taken to the procedure area where a timeout was performed. Anterior and posterior pads were placed in standard fashion and connected to a biphasic defibrillator.  Deep sedation was achieved via propofol per the anesthesia service.  A single, synchronized 120 J shock was delivered with successful restoration of sinus rhythm, confirmed by follow-up ECG.  Patient remained hemodynamically stable throughout.  There were no immediate complications.  Satira Sark, M.D., F.A.C.C.

## 2020-01-29 NOTE — Progress Notes (Signed)
Electrical Cardioversion Procedure Note David Schwartz [id number] 21-Dec-1945  Procedure: Electrical Cardioversion Indications:  Atrial Fibrillation  Procedure Details Consent: consent obtained Time Out: Verified patient identification, verified procedure, site/side was marked, verified correct patient position, special equipment/implants available, medications/allergies/relevent history reviewed, required imaging and test results available. Time Out Performed: [id number]  Patient placed on cardiac monitor, pulse oximetry, supplemental oxygen as necessary.  Sedation given: propofol Pacer pads placed anterior posterior  Cardioverted 1 time(s).  Cardioverted at 120 joules  Evaluation Findings: Post procedure EKG shows: NSR Complications: none Patient did tolerate procedure well.   Harmon Pier 01/29/2020, 4:39 PM

## 2020-01-29 NOTE — Interval H&P Note (Signed)
History and Physical Interval Note:  01/29/2020 9:56 AM  Patient presents for elective cardioversion in the setting of persistent atrial fibrillation and also recently documented cardiomyopathy.  I reviewed his baseline ECG and lab work including negative SARS coronavirus 2 test.  Informed consent obtained and he is ready to proceed.  Satira Sark, M.D., F.A.C.C.

## 2020-01-29 NOTE — Anesthesia Preprocedure Evaluation (Signed)
Anesthesia Evaluation  Patient identified by MRN, date of birth, ID band Patient awake    Reviewed: NPO status , Patient's Chart, lab work & pertinent test results, reviewed documented beta blocker date and time   History of Anesthesia Complications Negative for: history of anesthetic complications  Airway Mallampati: II  TM Distance: >3 FB Neck ROM: Full    Dental  (+) Chipped, Missing, Dental Advisory Given,    Pulmonary neg pulmonary ROS,    Pulmonary exam normal breath sounds clear to auscultation       Cardiovascular Exercise Tolerance: Good hypertension, Pt. on medications and Pt. on home beta blockers + dysrhythmias Atrial Fibrillation  Rhythm:Irregular Rate:Abnormal     Neuro/Psych negative neurological ROS  negative psych ROS   GI/Hepatic negative GI ROS, Neg liver ROS,   Endo/Other  negative endocrine ROS  Renal/GU negative Renal ROS  negative genitourinary   Musculoskeletal  (+) Arthritis ,   Abdominal   Peds negative pediatric ROS (+)  Hematology negative hematology ROS (+)   Anesthesia Other Findings   Reproductive/Obstetrics negative OB ROS                             Anesthesia Physical Anesthesia Plan  ASA: III  Anesthesia Plan: General   Post-op Pain Management:    Induction: Intravenous  PONV Risk Score and Plan: 2  Airway Management Planned: Nasal Cannula, Natural Airway and Simple Face Mask  Additional Equipment:   Intra-op Plan:   Post-operative Plan:   Informed Consent: I have reviewed the patients History and Physical, chart, labs and discussed the procedure including the risks, benefits and alternatives for the proposed anesthesia with the patient or authorized representative who has indicated his/her understanding and acceptance.     Dental advisory given  Plan Discussed with: CRNA and Surgeon  Anesthesia Plan Comments:          Anesthesia Quick Evaluation

## 2020-01-29 NOTE — Discharge Instructions (Signed)
CONTINUE ALL ROUTINE MEDICATIONS, NO CHANGES PER DR. Harbine     \ Monitored Anesthesia Care, Care After These instructions provide you with information about caring for yourself after your procedure. Your health care provider may also give you more specific instructions. Your treatment has been planned according to current medical practices, but problems sometimes occur. Call your health care provider if you have any problems or questions after your procedure. What can I expect after the procedure? After your procedure, you may:  Feel sleepy for several hours.  Feel clumsy and have poor balance for several hours.  Feel forgetful about what happened after the procedure.  Have poor judgment for several hours.  Feel nauseous or vomit.  Have a sore throat if you had a breathing tube during the procedure. Follow these instructions at home: For at least 24 hours after the procedure:      Have a responsible adult stay with you. It is important to have someone help care for you until you are awake and alert.  Rest as needed.  Do not: ? Participate in activities in which you could fall or become injured. ? Drive. ? Use heavy machinery. ? Drink alcohol. ? Take sleeping pills or medicines that cause drowsiness. ? Make important decisions or sign legal documents. ? Take care of children on your own. Eating and drinking  Follow the diet that is recommended by your health care provider.  If you vomit, drink water, juice, or soup when you can drink without vomiting.  Make sure you have little or no nausea before eating solid foods. General instructions  Take over-the-counter and prescription medicines only as told by your health care provider.  If you have sleep apnea, surgery and certain medicines can increase your risk for breathing problems. Follow instructions from your health care provider about wearing your sleep device: ? Anytime you are sleeping, including during  daytime naps. ? While taking prescription pain medicines, sleeping medicines, or medicines that make you drowsy.  If you smoke, do not smoke without supervision.  Keep all follow-up visits as told by your health care provider. This is important. Contact a health care provider if:  You keep feeling nauseous or you keep vomiting.  You feel light-headed.  You develop a rash.  You have a fever. Get help right away if:  You have trouble breathing. Summary  For several hours after your procedure, you may feel sleepy and have poor judgment.  Have a responsible adult stay with you for at least 24 hours or until you are awake and alert. This information is not intended to replace advice given to you by your health care provider. Make sure you discuss any questions you have with your health care provider. Document Revised: 11/19/2017 Document Reviewed: 12/12/2015 Elsevier Patient Education  [place].      Hospital doctor cardioversion is the delivery of a jolt of electricity to restore a normal rhythm to the heart. A rhythm that is too fast or is not regular keeps the heart from pumping well. In this procedure, sticky patches or metal paddles are placed on the chest to deliver electricity to the heart from a device. This procedure may be done in an emergency if:  There is low or no blood pressure as a result of the heart rhythm.  Normal rhythm must be restored as fast as possible to protect the brain and heart from further damage.  It may save a life. This may also be a scheduled  procedure for irregular or fast heart rhythms that are not immediately life-threatening. Tell a health care provider about:  Any allergies you have.  All medicines you are taking, including vitamins, herbs, eye drops, creams, and over-the-counter medicines.  Any problems you or family members have had with anesthetic medicines.  Any blood disorders you have.  Any  surgeries you have had.  Any medical conditions you have.  Whether you are pregnant or may be pregnant. What are the risks? Generally, this is a safe procedure. However, problems may occur, including:  Allergic reactions to medicines.  A blood clot that breaks free and travels to other parts of your body.  The possible return of an abnormal heart rhythm within hours or days after the procedure.  Your heart stopping (cardiac arrest). This is rare. What happens before the procedure? Medicines  Your health care provider may have you start taking: ? Blood-thinning medicines (anticoagulants) so your blood does not clot as easily. ? Medicines to help stabilize your heart rate and rhythm.  Ask your health care provider about: ? Changing or stopping your regular medicines. This is especially important if you are taking diabetes medicines or blood thinners. ? Taking medicines such as aspirin and ibuprofen. These medicines can thin your blood. Do not take these medicines unless your health care provider tells you to take them. ? Taking over-the-counter medicines, vitamins, herbs, and supplements. General instructions  Follow instructions from your health care provider about eating or drinking restrictions.  Plan to have someone take you home from the hospital or clinic.  If you will be going home right after the procedure, plan to have someone with you for 24 hours.  Ask your health care provider what steps will be taken to help prevent infection. These may include washing your skin with a germ-killing soap. What happens during the procedure?   An IV will be inserted into one of your veins.  Sticky patches (electrodes) or metal paddles may be placed on your chest.  You will be given a medicine to help you relax (sedative).  An electrical shock will be delivered. The procedure may vary among health care providers and hospitals. What can I expect after the procedure?  Your blood  pressure, heart rate, breathing rate, and blood oxygen level will be monitored until you leave the hospital or clinic.  Your heart rhythm will be watched to make sure it does not change.  You may have some redness on the skin where the shocks were given. Follow these instructions at home:  Do not drive for 24 hours if you were given a sedative during your procedure.  Take over-the-counter and prescription medicines only as told by your health care provider.  Ask your health care provider how to check your pulse. Check it often.  Rest for 48 hours after the procedure or as told by your health care provider.  Avoid or limit your caffeine use as told by your health care provider.  Keep all follow-up visits as told by your health care provider. This is important. Contact a health care provider if:  You feel like your heart is beating too quickly or your pulse is not regular.  You have a serious muscle cramp that does not go away. Get help right away if:  You have discomfort in your chest.  You are dizzy or you feel faint.  You have trouble breathing or you are short of breath.  Your speech is slurred.  You have trouble moving an  arm or leg on one side of your body.  Your fingers or toes turn cold or blue. Summary  Electrical cardioversion is the delivery of a jolt of electricity to restore a normal rhythm to the heart.  This procedure may be done right away in an emergency or may be a scheduled procedure if the condition is not an emergency.  Generally, this is a safe procedure.  After the procedure, check your pulse often as told by your health care provider. This information is not intended to replace advice given to you by your health care provider. Make sure you discuss any questions you have with your health care provider. Document Revised: 03/24/2019 Document Reviewed: 03/24/2019 Elsevier Patient Education  [place].

## 2020-01-30 ENCOUNTER — Telehealth: Payer: Self-pay | Admitting: *Deleted

## 2020-01-30 NOTE — Telephone Encounter (Signed)
-----   Message from Arnoldo Lenis, MD sent at 01/30/2020  4:37 PM EDT ----- Spoke with Dr Domenic Polite who reports cardioversoni went well. His echo did show my mild decrease in the pumping function of his heart, this may be due to the afib and may improved with management of his afib, we will also discuss some other med options at our f/u  Zandra Abts MD

## 2020-01-30 NOTE — Telephone Encounter (Signed)
Pt voiced understanding

## 2020-02-05 ENCOUNTER — Other Ambulatory Visit: Payer: Self-pay | Admitting: Urology

## 2020-02-05 ENCOUNTER — Other Ambulatory Visit: Payer: Self-pay

## 2020-02-05 DIAGNOSIS — C61 Malignant neoplasm of prostate: Secondary | ICD-10-CM

## 2020-02-06 LAB — PSA, TOTAL AND FREE
PSA, % Free: 14 % (calc) — ABNORMAL LOW (ref >25)
PSA, Free: 1.2 ng/mL
PSA, Total: 8.7 ng/mL — ABNORMAL HIGH (ref <=4.0)

## 2020-02-11 ENCOUNTER — Ambulatory Visit: Payer: Medicare Other | Admitting: Urology

## 2020-02-18 ENCOUNTER — Ambulatory Visit: Payer: Medicare Other | Admitting: Urology

## 2020-02-19 ENCOUNTER — Ambulatory Visit: Payer: Medicare Other | Admitting: Cardiology

## 2020-02-19 ENCOUNTER — Encounter: Payer: Self-pay | Admitting: Cardiology

## 2020-02-19 VITALS — BP 130/80 | HR 68 | Ht 68.0 in | Wt 170.4 lb

## 2020-02-19 DIAGNOSIS — I4891 Unspecified atrial fibrillation: Secondary | ICD-10-CM | POA: Diagnosis not present

## 2020-02-19 DIAGNOSIS — I519 Heart disease, unspecified: Secondary | ICD-10-CM | POA: Diagnosis not present

## 2020-02-19 MED ORDER — APIXABAN 5 MG PO TABS: 5.0000 mg | ORAL_TABLET | Freq: Two times a day (BID) | ORAL | 3 refills | Status: DC

## 2020-02-19 MED ORDER — ENTRESTO 24-26 MG PO TABS: 1.0000 | ORAL_TABLET | Freq: Two times a day (BID) | ORAL | 3 refills | Status: DC

## 2020-02-19 NOTE — Patient Instructions (Signed)
Your physician recommends that you schedule a follow-up appointment in: [place] has recommended you make the following change in your medication:   START ENTRESTO 24/26 MG TWICE DAILY   Your physician recommends that you return for lab work in: 2 WEEKS BMP  Thank you for choosing [facility]!!

## 2020-02-19 NOTE — Progress Notes (Signed)
Clinical Summary David Schwartz is a 74 y.o.male  1. Afib - new diagnosis noted by pcp during 12/25/19 visit - ekg from pcp reviewed, afib rate 108 - CHADS 2vasc score is at least 2 (age, HTN), started on eliquis and toprol 25mg    - no palpitations - nonspecific fatigue with activity. No SOB or DOE - started eliquis 12/26/19  - last visit we increased toprol to 50mg  daily due to afib with high rates - s/p DCCV 01/29/20 to SR - no recent palpitations.    2. 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 - no SOB, no DOE, no LE edema   SH: leaving next week, coming back May 20th. 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 Arthritis   . Hyperlipidemia   . Hypertension      No Known Allergies   Current Outpatient Medications  Medication Sig Dispense Refill  . alendronate (FOSAMAX) 70 MG tablet TAKE 1 TABLET BY MOUTH ONCE A WEEK 12 tablet 3  . apixaban (ELIQUIS) 5 MG TABS tablet Take 1 tablet (5 mg total) by mouth 2 (two) times daily. 60 tablet 3  . benzonatate (TESSALON) 100 MG capsule Take 1 capsule (100 mg total) by mouth 2 (two) times daily as needed for cough. 20 capsule 0  . calcium carbonate (OSCAL) 1500 (600 Ca) MG TABS tablet Take by mouth daily with breakfast.    . Cholecalciferol (VITAMIN D3) 1000 units CAPS Take by mouth.    Marland Kitchen ketoconazole (NIZORAL) 2 % cream Apply 1 application topically daily. 60 g 1  . metoprolol succinate (TOPROL-XL) 50 MG 24 hr tablet Take 1 tablet (50 mg total) by mouth daily. 30 tablet 6  . rosuvastatin (CRESTOR) 5 MG tablet TAKE 1 TABLET BY MOUTH  DAILY AT 2PM 90 tablet 1  . terazosin (HYTRIN) 2 MG capsule TAKE 1 CAPSULE BY MOUTH  DAILY AT 2PM 90 capsule 1  . terbinafine (LAMISIL) 250 MG tablet Take 1 tablet by mouth once daily 90 tablet 0   No current facility-administered medications for this visit.     Past  Surgical History:  Procedure Laterality Date  . CARDIOVERSION N/A 01/29/2020   Procedure: CARDIOVERSION;  Surgeon: Satira Sark, MD;  Location: [facility];  Service: Cardiovascular;  Laterality: N/A;  . HERNIA REPAIR    . KNEE ARTHROSCOPY       No Known Allergies    Family History  Problem Relation Age of Onset  . Diabetes Mother   . Hypertension Mother   . Early death Father   . Arthritis Maternal Grandmother   . Hypertension Maternal Grandfather   . Hypertension Paternal Grandmother   . Cancer Paternal Grandfather        lung     Social History David Schwartz reports that he has never smoked. He has never used smokeless tobacco. David Schwartz reports current alcohol use.   Review of Systems CONSTITUTIONAL: No weight loss, fever, chills, weakness or fatigue.  HEENT: Eyes: No visual loss, blurred vision, double vision or yellow sclerae.No hearing loss, sneezing, congestion, runny nose or sore throat.  SKIN: No rash or itching.  CARDIOVASCULAR: per hpi RESPIRATORY: No shortness of breath, cough or sputum.  GASTROINTESTINAL: No anorexia, nausea, vomiting or diarrhea. No abdominal pain or blood.  GENITOURINARY: No burning on urination, no polyuria NEUROLOGICAL: No headache, dizziness, syncope, paralysis, ataxia, numbness or tingling in  the extremities. No change in bowel or bladder control.  MUSCULOSKELETAL: No muscle, back pain, joint pain or stiffness.  LYMPHATICS: No enlarged nodes. No history of splenectomy.  PSYCHIATRIC: No history of depression or anxiety.  ENDOCRINOLOGIC: No reports of sweating, cold or heat intolerance. No polyuria or polydipsia.  Marland Kitchen   Physical Examination Today's Vitals   02/19/20 0947  BP: 130/80  Pulse: 68  SpO2: 98%  Weight: 170 lb 6.4 oz (77.3 kg)  Height: 5\' 8"  (1.727 m)   Body mass index is 25.91 kg/m.  Gen: resting comfortably, no acute distress HEENT: no scleral icterus, pupils equal round and reactive, no palptable cervical  adenopathy,  CV: RRR, no m/r/g, no jvd Resp: Clear to auscultation bilaterally GI: abdomen is soft, non-tender, non-distended, normal bowel sounds, no hepatosplenomegaly MSK: extremities are warm, no edema.  Skin: warm, no rash Neuro:  no focal deficits Psych: appropriate affect   Diagnostic Studies  01/2020 echo  IMPRESSIONS    1. Left ventricular ejection fraction, by estimation, is 35 to 40%. The  left ventricle has moderately decreased function. The left ventricle  demonstrates global hypokinesis. There is mild left ventricular  hypertrophy of the posterior segment. Left  ventricular diastolic parameters are indeterminate.  2. Right ventricular systolic function is normal. The right ventricular  size is normal. There is normal pulmonary artery systolic pressure.  3. Left atrial size was moderately dilated.  4. Right atrial size was mildly dilated.  5. The mitral valve is degenerative. Mild to moderate mitral valve  regurgitation.  6. Tricuspid valve regurgitation is moderate.  7. The aortic valve is tricuspid. Aortic valve regurgitation is mild. No  aortic stenosis is present.  8. The inferior vena cava is normal in size with greater than 50%  respiratory variability, suggesting right atrial pressure of 3 mmHg.   Assessment and Plan   1. Afib - new diagnosis by pcp 12/25/19 - s/p DCCV, today EKG shows remains in SR - continue toprol, continue eliquis  2. 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 Start entresto 24/26mg  bid. BMET in 2 weeks - titrate meds over next few months, likely repeat echo in 3-6 months     Arnoldo Lenis, M.D.

## 2020-02-25 ENCOUNTER — Encounter: Payer: Self-pay | Admitting: Urology

## 2020-02-25 ENCOUNTER — Other Ambulatory Visit: Payer: Self-pay

## 2020-02-25 ENCOUNTER — Ambulatory Visit (INDEPENDENT_AMBULATORY_CARE_PROVIDER_SITE_OTHER): Payer: Medicare Other | Admitting: Urology

## 2020-02-25 VITALS — BP 123/67 | HR 61 | Temp 98.4°F | Ht 69.0 in | Wt 170.0 lb

## 2020-02-25 DIAGNOSIS — N401 Enlarged prostate with lower urinary tract symptoms: Secondary | ICD-10-CM

## 2020-02-25 DIAGNOSIS — C61 Malignant neoplasm of prostate: Secondary | ICD-10-CM

## 2020-02-25 DIAGNOSIS — N5201 Erectile dysfunction due to arterial insufficiency: Secondary | ICD-10-CM

## 2020-02-25 DIAGNOSIS — N138 Other obstructive and reflux uropathy: Secondary | ICD-10-CM | POA: Diagnosis not present

## 2020-02-25 DIAGNOSIS — I10 Essential (primary) hypertension: Secondary | ICD-10-CM

## 2020-02-25 MED ORDER — TERAZOSIN HCL 2 MG PO CAPS: 2.0000 mg | ORAL_CAPSULE | Freq: Every day | ORAL | 3 refills | Status: DC

## 2020-02-25 MED ORDER — TADALAFIL 20 MG PO TABS
20.0000 mg | ORAL_TABLET | ORAL | 5 refills | Status: DC | PRN
Start: 2020-02-25 — End: 2020-08-25

## 2020-02-25 NOTE — Patient Instructions (Signed)
Prostate Cancer  The prostate is a male gland that helps make semen. Prostate cancer is when abnormal cells grow in this gland. Follow these instructions at home:  Take over-the-counter and prescription medicines only as told by your doctor.  Eat a healthy diet.  Get plenty of sleep.  Ask your doctor for help to find a support group for men with prostate cancer.  Keep all follow-up visits as told by your doctor. This is important.  If you have to go to the hospital, let your cancer doctor (oncologist) know.  Touch, hold, hug, and caress your partner to continue to show sexual feelings. Contact a doctor if:  You have trouble peeing (urinating).  You have blood in your pee (urine).  You have pain in your hips, back, or chest. Get help right away if:  You have weakness in your legs.  You lose feeling (have numbness) in your legs.  You cannot control your pee or your poop (stool).  You have trouble breathing.  You have sudden pain in your chest.  You have chills or a fever. Summary  The prostate is a male gland that helps make semen. Prostate cancer is when abnormal cells grow in this gland.  Ask your doctor for help to find a support group for men with prostate cancer.  Contact a doctor if you have problems peeing or have any new pain that you did not have before. This information is not intended to replace advice given to you by your health care provider. Make sure you discuss any questions you have with your health care provider. Document Revised: 08/03/2017 Document Reviewed: 05/01/2016 Elsevier Patient Education  2020 Elsevier Inc.  

## 2020-02-25 NOTE — Progress Notes (Signed)
02/25/2020 3:57 PM   David Schwartz 08/16/1946 [id number]  Referring provider: Dettinger, Fransisca Kaufmann, MD [place],  [place] [id number]  Prostate cANCER  HPI: David Schwartz is a 74yo here for followup for low risk prostate cancer, BPH, and ED. He is currently on active surveillance for his prostate cancer. PSA decreased to 8.7 from 10. He was recently diagnosed with afib and is on eliquis. NO worsening LUTS. He is on hytrin 2mg . Nocturia 1-2x. Intermittent weak stream. Intermittent urinary frequency. He is happy with his urination. ED is stable. He uses tadalafil 20mg  with good results  His records from [facility] are as follows: I have prostate cancer. HPI: David Schwartz is a 74 year-old male established patient who is here evaluation for treatment of prostate cancer.  His prostate cancer was diagnosed 07/16/2015. He does have the pathology report from his biopsy. His cancer was diagnosed by Dr. Pilar Jarvis. His PSA at his time of diagnosis was 8.1. His most recent PSA is 9.9.   He has not undergone surgery for treatment. He has not undergone External Beam Radiation Therapy for treatment. He has not undergone Hormonal Therapy for treatment.   He does not have urinary incontinence. He does not have problems with erectile dysfunction. He has not recently had unwanted weight loss. He is not having pain in new locations.   The patient is a 74 year-old gentleman who presents for prostate cancer follow up. From 2014 to 2016, his PSA has gone from 3.12 to 5.45 to 8.1.   The patient underwent prostate biopsy in November 2016 which showed Gleason 3+3 = 6 in 3 cores at his apex. All cores were less than 5%. PSA was 8.1 at diagnosis. He, therefore, was risk stratified as low risk prostate cancer.   PSA in March 2017 is 5.14 with free PSA of 18%.   Repeat biopsy in July 2017 was negative for prostate cancer.   PSA in January 2018 was 4.54. PSA in July 2018 rose to 12.10. PSA in October 2018 was 9.79.  PSA decreased to explain 52 in May 2019.   He underwent MRI of prostate in November 2018 to evaluate for more aggressive prostate cancer due to the rise in his PSA. It only showed a PI-RADS 1 lesion. There were no lesions concerning for high-grade disease.   07/24/2018: PSA increased to 7.4   02/05/2019: PSA decreased to 2.6   08/06/2019: PSA increased to 9.9. no worsening LUTS.   CC: BPH HPI: His symptoms have been stable over the last year. The patient complains of lower urinary tract symptom(s) that include nocturia. The patient states if he were to spend the rest of his life with his current urinary condition, he would be pleased. He denies any other associated symptoms.   Patient notes that he voids well. He does not have nocturia. His stream and feels that he empties his bladder. He denies hesitancy, intermittency, and hesitancy.   07/24/2018: He has intermittent nocturia 0-2x depending on fluid consumption. He is on terazosin.   02/05/2019: He is on terazosin. No new LUTS. nocturia well managed with fluid management.   08/06/2019: He has stable LUTS on terazosin.   CC: I am having trouble with my erections. HPI: He first stated noticing pain on approximately 02/02/2017. His symptoms did begin gradually. His symptoms have been worse and stable over the last year.   He does have difficulties achieving an erection. He does have problems maintaining his erections. His erections are  straight.   He does not have premature ejaculation. He does not have trouble reaching climax. He does have anxiety because of the symptoms.   02/05/2019: For the past 2 years he has noticed issues both getting and maintaining an erection.   08/06/2019: He tried tadalafil 20mg  prn which helps with his ED. Biggest improvement was stopped BP meds.   PMH: Past Medical History:  Diagnosis Date  . Arthritis   . Hyperlipidemia   . Hypertension     Surgical History: Past Surgical History:  Procedure Laterality  Date  . CARDIOVERSION N/A 01/29/2020   Procedure: CARDIOVERSION;  Surgeon: Satira Sark, MD;  Location: [facility];  Service: Cardiovascular;  Laterality: N/A;  . HERNIA REPAIR    . KNEE ARTHROSCOPY      Home Medications:  Allergies as of 02/25/2020   No Known Allergies     Medication List       Accurate as of February 25, 2020  3:57 PM. If you have any questions, ask your nurse or doctor.        alendronate 70 MG tablet Commonly known as: FOSAMAX TAKE 1 TABLET BY MOUTH ONCE A WEEK   apixaban 5 MG Tabs tablet Commonly known as: ELIQUIS Take 1 tablet (5 mg total) by mouth 2 (two) times daily.   benzonatate 100 MG capsule Commonly known as: TESSALON Take 1 capsule (100 mg total) by mouth 2 (two) times daily as needed for cough.   calcium carbonate 1500 (600 Ca) MG Tabs tablet Commonly known as: OSCAL Take by mouth daily with breakfast.   Entresto 24-26 MG Generic drug: sacubitril-valsartan Take 1 tablet by mouth 2 (two) times daily.   ketoconazole 2 % cream Commonly known as: NIZORAL Apply 1 application topically daily.   metoprolol succinate 50 MG 24 hr tablet Commonly known as: TOPROL-XL Take 1 tablet (50 mg total) by mouth daily.   rosuvastatin 5 MG tablet Commonly known as: CRESTOR TAKE 1 TABLET BY MOUTH  DAILY AT 2PM   terazosin 2 MG capsule Commonly known as: HYTRIN TAKE 1 CAPSULE BY MOUTH  DAILY AT 2PM   terbinafine 250 MG tablet Commonly known as: LAMISIL Take 1 tablet by mouth once daily   Vitamin D3 25 MCG (1000 UT) Caps Take by mouth.       Allergies: No Known Allergies  Family History: Family History  Problem Relation Age of Onset  . Diabetes Mother   . Hypertension Mother   . Early death Father   . Arthritis Maternal Grandmother   . Hypertension Maternal Grandfather   . Hypertension Paternal Grandmother   . Cancer Paternal Grandfather        lung    Social History:  reports that he has never smoked. He has never used smokeless  tobacco. He reports current alcohol use. He reports that he does not use drugs.  ROS: All other review of systems were reviewed and are negative except what is noted above in HPI  Physical Exam: BP 123/67   Pulse 61   Temp 98.4 F (36.9 C)   Ht 5\' 9"  (1.753 m)   Wt 170 lb (77.1 kg)   BMI 25.10 kg/m   Constitutional:  Alert and oriented, No acute distress. HEENT: [place] AT, moist mucus membranes.  Trachea midline, no masses. Cardiovascular: No clubbing, cyanosis, or edema. Respiratory: Normal respiratory effort, no increased work of breathing. GI: Abdomen is soft, nontender, nondistended, no abdominal masses GU: No CVA tenderness. Circumcised phallus. No masses/lesions on penis,  testis, scrotum. Prostate 40g smooth no nodules no induration.  Lymph: No cervical or inguinal lymphadenopathy. Skin: No rashes, bruises or suspicious lesions. Neurologic: Grossly intact, no focal deficits, moving all 4 extremities. Psychiatric: Normal mood and affect.  Laboratory Data: Lab Results  Component Value Date   WBC 6.5 12/25/2019   HGB 16.5 12/25/2019   HCT 46.0 12/25/2019   MCV 90 12/25/2019   PLT 202 12/25/2019    Lab Results  Component Value Date   CREATININE 0.84 01/27/2020    No results found for: PSA  No results found for: TESTOSTERONE  No results found for: HGBA1C  Urinalysis No results found for: COLORURINE, APPEARANCEUR, LABSPEC, PHURINE, GLUCOSEU, HGBUR, BILIRUBINUR, KETONESUR, PROTEINUR, UROBILINOGEN, NITRITE, LEUKOCYTESUR  No results found for: LABMICR, [place], RBCUA, LABEPIT, MUCUS, BACTERIA  Pertinent Imaging:  No results found for this or any previous visit.  No results found for this or any previous visit.  No results found for this or any previous visit.  No results found for this or any previous visit.  No results found for this or any previous visit.  No results found for this or any previous visit.  No results found for this or any previous visit.  No  results found for this or any previous visit.   Assessment & Plan:    1. LOw risk prostate cancer -continue active surveillance -RCT 6 months with PSA  2. BPH with nocturia: -continue hytrin 2mg  aily  3. ED: -tadalafil 20mg  prn No follow-ups on file.  Nicolette Bang, MD  [facility] [place]

## 2020-02-25 NOTE — Progress Notes (Signed)
Urological Symptom Review  Patient is experiencing the following symptoms: Frequent urination Hard to postpone urination Get up at night to urinate Weak stream   Review of Systems  Gastrointestinal (upper)  : Negative for upper GI symptoms  Gastrointestinal (lower) : Negative for lower GI symptoms  Constitutional : Negative for symptoms  Skin: Negative for skin symptoms  Eyes: Negative for eye symptoms  Ear/Nose/Throat : Negative for Ear/Nose/Throat symptoms  Hematologic/Lymphatic: Negative for Hematologic/Lymphatic symptoms  Cardiovascular : Negative for cardiovascular symptoms  Respiratory : Negative for respiratory symptoms  Endocrine: Negative for endocrine symptoms  Musculoskeletal: Negative for musculoskeletal symptoms  Neurological: Negative for neurological symptoms  Psychologic: Negative for psychiatric symptoms  

## 2020-03-04 ENCOUNTER — Other Ambulatory Visit: Payer: Medicare Other

## 2020-03-04 ENCOUNTER — Other Ambulatory Visit: Payer: Self-pay

## 2020-03-04 ENCOUNTER — Other Ambulatory Visit: Payer: Self-pay | Admitting: *Deleted

## 2020-03-04 DIAGNOSIS — I4891 Unspecified atrial fibrillation: Secondary | ICD-10-CM

## 2020-03-16 ENCOUNTER — Telehealth: Payer: Self-pay | Admitting: *Deleted

## 2020-03-16 NOTE — Telephone Encounter (Signed)
Pt aware - routed to pcp  

## 2020-03-16 NOTE — Telephone Encounter (Signed)
-----   Message from Arnoldo Lenis, MD sent at 03/15/2020 12:46 PM EDT ----- Normal labs  Zandra Abts MD

## 2020-03-23 ENCOUNTER — Other Ambulatory Visit: Payer: Self-pay | Admitting: Family Medicine

## 2020-03-23 DIAGNOSIS — B351 Tinea unguium: Secondary | ICD-10-CM

## 2020-03-29 ENCOUNTER — Ambulatory Visit: Payer: Medicare Other | Admitting: Cardiology

## 2020-03-29 ENCOUNTER — Other Ambulatory Visit: Payer: Self-pay

## 2020-03-29 ENCOUNTER — Encounter: Payer: Self-pay | Admitting: Cardiology

## 2020-03-29 VITALS — BP 112/72 | HR 64 | Ht 69.0 in | Wt 171.2 lb

## 2020-03-29 DIAGNOSIS — I519 Heart disease, unspecified: Secondary | ICD-10-CM | POA: Diagnosis not present

## 2020-03-29 DIAGNOSIS — I4891 Unspecified atrial fibrillation: Secondary | ICD-10-CM | POA: Diagnosis not present

## 2020-03-29 NOTE — Progress Notes (Signed)
Clinical Summary David Schwartz is a 74 y.o.male seen today for follow up of the following medical problems.     1. Afib - new diagnosis noted by pcp during 12/25/19 visit - ekg from pcp reviewed, afib rate 108 - CHADS 2vasc score is at least 2 (age, HTN), started on eliquis and toprol 25mg   - s/p DCCV 01/29/20 to SR - no recent palpitaitons - compliant with meds. No bleeding on eliquis    2. 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   Past Medical History:  Diagnosis Date  . Arthritis   . Hyperlipidemia   . Hypertension      No Known Allergies   Current Outpatient Medications  Medication Sig Dispense Refill  . alendronate (FOSAMAX) 70 MG tablet TAKE 1 TABLET BY MOUTH ONCE A WEEK 12 tablet 3  . apixaban (ELIQUIS) 5 MG TABS tablet Take 1 tablet (5 mg total) by mouth 2 (two) times daily. 60 tablet 3  . benzonatate (TESSALON) 100 MG capsule Take 1 capsule (100 mg total) by mouth 2 (two) times daily as needed for cough. 20 capsule 0  . calcium carbonate (OSCAL) 1500 (600 Ca) MG TABS tablet Take by mouth daily with breakfast.    . Cholecalciferol (VITAMIN D3) 1000 units CAPS Take by mouth.    Marland Kitchen ketoconazole (NIZORAL) 2 % cream Apply 1 application topically daily. 60 g 1  . metoprolol succinate (TOPROL-XL) 50 MG 24 hr tablet Take 1 tablet (50 mg total) by mouth daily. 30 tablet 6  . rosuvastatin (CRESTOR) 5 MG tablet TAKE 1 TABLET BY MOUTH  DAILY AT 2PM 90 tablet 1  . sacubitril-valsartan (ENTRESTO) 24-26 MG Take 1 tablet by mouth 2 (two) times daily. 60 tablet 3  . tadalafil (CIALIS) 20 MG tablet Take 1 tablet (20 mg total) by mouth as needed for erectile dysfunction. 20 tablet 5  . terazosin (HYTRIN) 2 MG capsule Take 1 capsule (2 mg total) by mouth at  bedtime. 90 capsule 3  . terbinafine (LAMISIL) 250 MG tablet Take 1 tablet by mouth once daily 90 tablet 0   No current facility-administered medications for this visit.     Past Surgical History:  Procedure Laterality Date  . CARDIOVERSION N/A 01/29/2020   Procedure: CARDIOVERSION;  Surgeon: Satira Sark, MD;  Location: [facility];  Service: Cardiovascular;  Laterality: N/A;  . HERNIA REPAIR    . KNEE ARTHROSCOPY       No Known Allergies    Family History  Problem Relation Age of Onset  . Diabetes Mother   . Hypertension Mother   . Early death Father   . Arthritis Maternal Grandmother   . Hypertension Maternal Grandfather   . Hypertension Paternal Grandmother   . Cancer Paternal Grandfather        lung     Social History David Schwartz reports that he has never smoked. He has never used smokeless tobacco. David Schwartz reports current alcohol use.   Review of Systems CONSTITUTIONAL: No weight loss, fever, chills, weakness or fatigue.  HEENT: Eyes: No visual loss, blurred vision, double vision or yellow sclerae.No hearing loss, sneezing, congestion, runny nose or sore throat.  SKIN: No rash or itching.  CARDIOVASCULAR: per hpi RESPIRATORY: No  shortness of breath, cough or sputum.  GASTROINTESTINAL: No anorexia, nausea, vomiting or diarrhea. No abdominal pain or blood.  GENITOURINARY: No burning on urination, no polyuria NEUROLOGICAL: No headache, dizziness, syncope, paralysis, ataxia, numbness or tingling in the extremities. No change in bowel or bladder control.  MUSCULOSKELETAL: No muscle, back pain, joint pain or stiffness.  LYMPHATICS: No enlarged nodes. No history of splenectomy.  PSYCHIATRIC: No history of depression or anxiety.  ENDOCRINOLOGIC: No reports of sweating, cold or heat intolerance. No polyuria or polydipsia.  Marland Kitchen   Physical Examination Today's Vitals   03/29/20 0915  BP: 112/72  Pulse: 64  SpO2: 98%  Weight: 171 lb 3.2 oz (77.7 kg)  Height: 5'  9" (1.753 m)   Body mass index is 25.28 kg/m.  Gen: resting comfortably, no acute distress HEENT: no scleral icterus, pupils equal round and reactive, no palptable cervical adenopathy,  CV: RRR, no m/r/g, no jvd Resp: Clear to auscultation bilaterally GI: abdomen is soft, non-tender, non-distended, normal bowel sounds, no hepatosplenomegaly MSK: extremities are warm, no edema.  Skin: warm, no rash Neuro:  no focal deficits Psych: appropriate affect   Diagnostic Studies  01/2020 echo  IMPRESSIONS    1. Left ventricular ejection fraction, by estimation, is 35 to 40%. The  left ventricle has moderately decreased function. The left ventricle  demonstrates global hypokinesis. There is mild left ventricular  hypertrophy of the posterior segment. Left  ventricular diastolic parameters are indeterminate.  2. Right ventricular systolic function is normal. The right ventricular  size is normal. There is normal pulmonary artery systolic pressure.  3. Left atrial size was moderately dilated.  4. Right atrial size was mildly dilated.  5. The mitral valve is degenerative. Mild to moderate mitral valve  regurgitation.  6. Tricuspid valve regurgitation is moderate.  7. The aortic valve is tricuspid. Aortic valve regurgitation is mild. No  aortic stenosis is present.  8. The inferior vena cava is normal in size with greater than 50%  respiratory variability, suggesting right atrial pressure of 3 mmHg.    Assessment and Plan   1. Afib - new diagnosis by pcp 12/25/19 - s/p DCCV. Remains in SR today by EKG in clinic - no symptoms, continue current meds  2. 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 Repeat echo end of August, if ongonig dysfunction would consider titrating meds more aggresively. 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

## 2020-03-29 NOTE — Patient Instructions (Addendum)
Medication Instructions:   Your physician recommends that you continue on your current medications as directed. Please refer to the Current Medication list given to you today. *If you need a refill on your cardiac medications before your next appointment, please call your pharmacy*   Lab Work:  NONE If you have labs (blood work) drawn today and your tests are completely normal, you will receive your results only by: Marland Kitchen MyChart Message (if you have MyChart) OR . A paper copy in the mail If you have any lab test that is abnormal or we need to change your treatment, we will call you to review the results.   Testing/Procedures: Your physician has requested that you have an echocardiogram end of August 2021. Echocardiography is a painless test that uses sound waves to create images of your heart. It provides your doctor with information about the size and shape of your heart and how well your heart's chambers and valves are working. This procedure takes approximately one hour. There are no restrictions for this procedure.   Follow-Up: At [facility], you and your health needs are our priority.  As part of our continuing mission to provide you with exceptional heart care, we have created designated Provider Care Teams.  These Care Teams include your primary Cardiologist (physician) and Advanced Practice Providers (APPs -  Physician Assistants and Nurse Practitioners) who all work together to provide you with the care you need, when you need it.  We recommend signing up for the patient portal called "MyChart".  Sign up information is provided on this After Visit Summary.  MyChart is used to connect with patients for Virtual Visits (Telemedicine).  Patients are able to view lab/test results, encounter notes, upcoming appointments, etc.  Non-urgent messages can be sent to your provider as well.   To learn more about what you can do with MyChart, go to [email or web address].    Your next  appointment:    Early September 2021  The format for your next appointment:    In Person  Provider:   You may see Carlyle Dolly, MD or the following Advanced Practice Provider on your designated Care Team:    Katina Dung, NP    Other Instructions

## 2020-04-08 ENCOUNTER — Other Ambulatory Visit: Payer: Self-pay

## 2020-04-08 ENCOUNTER — Encounter: Payer: Self-pay | Admitting: Family Medicine

## 2020-04-08 ENCOUNTER — Ambulatory Visit (INDEPENDENT_AMBULATORY_CARE_PROVIDER_SITE_OTHER): Payer: Medicare Other | Admitting: Family Medicine

## 2020-04-08 VITALS — BP 126/75 | HR 56 | Temp 97.2°F | Ht 69.0 in | Wt 170.0 lb

## 2020-04-08 DIAGNOSIS — E782 Mixed hyperlipidemia: Secondary | ICD-10-CM | POA: Diagnosis not present

## 2020-04-08 DIAGNOSIS — Z23 Encounter for immunization: Secondary | ICD-10-CM | POA: Diagnosis not present

## 2020-04-08 DIAGNOSIS — I4819 Other persistent atrial fibrillation: Secondary | ICD-10-CM

## 2020-04-08 DIAGNOSIS — I10 Essential (primary) hypertension: Secondary | ICD-10-CM | POA: Diagnosis not present

## 2020-04-08 NOTE — Progress Notes (Signed)
BP 126/75   Pulse (!) 56   Temp (!) 97.2 F (36.2 C)   Ht _0  (1.753 m)   Wt 170 lb (77.1 kg)   SpO2 97%   BMI 25.10 kg/m    Subjective:   Patient ID: David Schwartz, male    DOB: 21-Jan-1946, 74 y.o.   MRN: [id number]  HPI: David Schwartz is a 74 y.o. male presenting on 04/08/2020 for Medical Management of Chronic Issues, Hyperlipidemia, and Hypertension   HPI Hypertension Patient is currently on metoprolol and Entresto, and their blood pressure today is 126/75. Patient denies any lightheadedness or dizziness. Patient denies headaches, blurred vision, chest pains, shortness of breath, or weakness. Denies any side effects from medication and is content with current medication.   Hyperlipidemia Patient is coming in for recheck of his hyperlipidemia. The patient is currently taking rosuvastatin. They deny any issues with myalgias or history of liver damage from it. They deny any focal numbness or weakness or chest pain.   A. fib recheck Patient was recently diagnosed with A. fib and had a cardioversion and it seems to have held, he is on anticoagulation with Eliquis. Reason on anticoagulation: A. fib Patient denies any bruising or bleeding or chest pain or palpitations   Patient also takes terbinafine for onychomycosis and it is mostly clearing up and seems to be doing well.  Relevant past medical, surgical, family and social history reviewed and updated as indicated. Interim medical history since our last visit reviewed. Allergies and medications reviewed and updated.  Review of Systems  Constitutional: Negative for chills and fever.  Respiratory: Negative for shortness of breath and wheezing.   Cardiovascular: Negative for chest pain and leg swelling.  Musculoskeletal: Negative for back pain and gait problem.  Skin: Negative for rash.  Neurological: Negative for dizziness, weakness and light-headedness.  All other systems reviewed and are negative.   Per HPI unless  specifically indicated above   Allergies as of 04/08/2020   No Known Allergies     Medication List       Accurate as of April 08, 2020 10:06 AM. If you have any questions, ask your nurse or doctor.        alendronate 70 MG tablet Commonly known as: FOSAMAX TAKE 1 TABLET BY MOUTH ONCE A WEEK   apixaban 5 MG Tabs tablet Commonly known as: ELIQUIS Take 1 tablet (5 mg total) by mouth 2 (two) times daily.   benzonatate 100 MG capsule Commonly known as: TESSALON Take 1 capsule (100 mg total) by mouth 2 (two) times daily as needed for cough.   calcium carbonate 1500 (600 Ca) MG Tabs tablet Commonly known as: OSCAL Take by mouth daily with breakfast.   Entresto 24-26 MG Generic drug: sacubitril-valsartan Take 1 tablet by mouth 2 (two) times daily.   ketoconazole 2 % cream Commonly known as: NIZORAL Apply 1 application topically daily.   metoprolol succinate 50 MG 24 hr tablet Commonly known as: TOPROL-XL Take 1 tablet (50 mg total) by mouth daily.   rosuvastatin 5 MG tablet Commonly known as: CRESTOR TAKE 1 TABLET BY MOUTH  DAILY AT 2PM   tadalafil 20 MG tablet Commonly known as: CIALIS Take 1 tablet (20 mg total) by mouth as needed for erectile dysfunction.   terazosin 2 MG capsule Commonly known as: HYTRIN Take 1 capsule (2 mg total) by mouth at bedtime.   terbinafine 250 MG tablet Commonly known as: LAMISIL Take 1 tablet by mouth once daily  Vitamin D3 25 MCG (1000 UT) Caps Take by mouth.        Objective:   BP 126/75   Pulse (!) 56   Temp (!) 97.2 F (36.2 C)   Ht _0  (1.753 m)   Wt 170 lb (77.1 kg)   SpO2 97%   BMI 25.10 kg/m   Wt Readings from Last 3 Encounters:  04/08/20 170 lb (77.1 kg)  03/29/20 171 lb 3.2 oz (77.7 kg)  02/25/20 170 lb (77.1 kg)    Physical Exam Vitals and nursing note reviewed.  Constitutional:      General: He is not in acute distress.    Appearance: He is well-developed. He is not diaphoretic.  Eyes:      General: No scleral icterus.    Conjunctiva/sclera: Conjunctivae normal.  Neck:     Thyroid: No thyromegaly.  Cardiovascular:     Rate and Rhythm: Normal rate and regular rhythm.     Heart sounds: Normal heart sounds. No murmur heard.   Pulmonary:     Effort: Pulmonary effort is normal. No respiratory distress.     Breath sounds: Normal breath sounds. No wheezing.  Musculoskeletal:        General: Normal range of motion.     Cervical back: Neck supple.  Lymphadenopathy:     Cervical: No cervical adenopathy.  Skin:    General: Skin is warm and dry.     Findings: No rash.  Neurological:     Mental Status: He is alert and oriented to person, place, and time.     Coordination: Coordination normal.  Psychiatric:        Behavior: Behavior normal.       Assessment & Plan:   Problem List Items Addressed This Visit      Cardiovascular and Mediastinum   Hypertension - Primary   Relevant Orders   CBC with Differential/Platelet   CMP14+EGFR   Persistent atrial fibrillation (HCC)   Relevant Orders   CBC with Differential/Platelet     Other   Hyperlipemia   Relevant Orders   Lipid panel    Other Visit Diagnoses    Need for vaccination against Streptococcus pneumoniae using pneumococcal conjugate vaccine 13       Relevant Orders   Pneumococcal conjugate vaccine 13-valent (Completed)      Continue current medication, no changes, seems to be doing very well. Follow up plan: Return in about 6 months (around 10/09/2020), or if symptoms worsen or fail to improve, for Hypertension hyperlipidemia.  Counseling provided for all of the vaccine components Orders Placed This Encounter  Procedures  . Pneumococcal conjugate vaccine 13-valent  . CBC with Differential/Platelet  . CMP14+EGFR  . Lipid panel    Caryl Pina, MD [facility] 04/08/2020, 10:06 AM

## 2020-04-09 LAB — CBC WITH DIFFERENTIAL/PLATELET
Basophils Absolute: 0 x10E3/uL (ref 0.0–0.2)
Basos: 1 %
EOS (ABSOLUTE): 0.2 x10E3/uL (ref 0.0–0.4)
Eos: 3 %
Hematocrit: 40.5 % (ref 37.5–51.0)
Hemoglobin: 14.8 g/dL (ref 13.0–17.7)
Immature Grans (Abs): 0 x10E3/uL (ref 0.0–0.1)
Immature Granulocytes: 0 %
Lymphocytes Absolute: 1.4 x10E3/uL (ref 0.7–3.1)
Lymphs: 24 %
MCH: 32.7 pg (ref 26.6–33.0)
MCHC: 36.5 g/dL — ABNORMAL HIGH (ref 31.5–35.7)
MCV: 90 fL (ref 79–97)
Monocytes Absolute: 0.6 x10E3/uL (ref 0.1–0.9)
Monocytes: 10 %
Neutrophils Absolute: 3.7 x10E3/uL (ref 1.4–7.0)
Neutrophils: 62 %
Platelets: 190 x10E3/uL (ref 150–450)
RBC: 4.52 x10E6/uL (ref 4.14–5.80)
RDW: 14.5 % (ref 11.6–15.4)
WBC: 5.9 x10E3/uL (ref 3.4–10.8)

## 2020-04-09 LAB — CMP14+EGFR
ALT: 13 IU/L (ref 0–44)
AST: 18 IU/L (ref 0–40)
Albumin/Globulin Ratio: 1.9 (ref 1.2–2.2)
Albumin: 4.3 g/dL (ref 3.7–4.7)
Alkaline Phosphatase: 53 IU/L (ref 48–121)
BUN/Creatinine Ratio: 13 (ref 10–24)
BUN: 10 mg/dL (ref 8–27)
Bilirubin Total: 0.7 mg/dL (ref 0.0–1.2)
CO2: 25 mmol/L (ref 20–29)
Calcium: 9 mg/dL (ref 8.6–10.2)
Chloride: 105 mmol/L (ref 96–106)
Creatinine, Ser: 0.78 mg/dL (ref 0.76–1.27)
GFR calc Af Amer: 103 mL/min/{1.73_m2} (ref >59)
GFR calc non Af Amer: 89 mL/min/{1.73_m2} (ref >59)
Globulin, Total: 2.3 g/dL (ref 1.5–4.5)
Glucose: 91 mg/dL (ref 65–99)
Potassium: 4.5 mmol/L (ref 3.5–5.2)
Sodium: 143 mmol/L (ref 134–144)
Total Protein: 6.6 g/dL (ref 6.0–8.5)

## 2020-04-09 LAB — LIPID PANEL
Chol/HDL Ratio: 3.7 ratio (ref 0.0–5.0)
Cholesterol, Total: 168 mg/dL (ref 100–199)
HDL: 45 mg/dL (ref >39)
LDL Chol Calc (NIH): 98 mg/dL (ref 0–99)
Triglycerides: 141 mg/dL (ref 0–149)
VLDL Cholesterol Cal: 25 mg/dL (ref 5–40)

## 2020-04-22 ENCOUNTER — Ambulatory Visit (INDEPENDENT_AMBULATORY_CARE_PROVIDER_SITE_OTHER): Payer: Medicare Other

## 2020-04-22 DIAGNOSIS — I519 Heart disease, unspecified: Secondary | ICD-10-CM | POA: Diagnosis not present

## 2020-04-22 DIAGNOSIS — I4891 Unspecified atrial fibrillation: Secondary | ICD-10-CM | POA: Diagnosis not present

## 2020-04-22 LAB — ECHOCARDIOGRAM COMPLETE
Area-P 1/2: 2.66 cm2
Calc EF: 60.5 %
MV M vel: 2.42 m/s
MV Peak grad: 23.3 mmHg
S' Lateral: 2.97 cm
Single Plane A2C EF: 62.6 %
Single Plane A4C EF: 56.4 %

## 2020-04-28 ENCOUNTER — Telehealth: Payer: Self-pay | Admitting: Cardiology

## 2020-04-28 NOTE — Telephone Encounter (Signed)
Just resulted   J Thanh Mottern MD 

## 2020-04-28 NOTE — Telephone Encounter (Signed)
Notified results are on provider desk top awaiting his review.

## 2020-04-28 NOTE — Telephone Encounter (Signed)
Laurine Blazer, LPN  7/49/4496 7:59 PM EDT Back to Top    Notified, copy to pcp.    Arnoldo Lenis, MD  04/28/2020 12:59 PM EDT     Echo looks good, heart pumping function has improved and is back to normal  Zandra Abts MD

## 2020-04-28 NOTE — Telephone Encounter (Signed)
Patient called requesting test results of recent echo.  Please call [phone].

## 2020-05-14 ENCOUNTER — Encounter: Payer: Self-pay | Admitting: Cardiology

## 2020-05-14 ENCOUNTER — Other Ambulatory Visit: Payer: Self-pay

## 2020-05-14 ENCOUNTER — Ambulatory Visit: Payer: Medicare Other | Admitting: Cardiology

## 2020-05-14 VITALS — BP 120/72 | HR 63 | Ht 68.0 in | Wt 174.0 lb

## 2020-05-14 DIAGNOSIS — I4891 Unspecified atrial fibrillation: Secondary | ICD-10-CM

## 2020-05-14 DIAGNOSIS — I519 Heart disease, unspecified: Secondary | ICD-10-CM

## 2020-05-14 MED ORDER — LOSARTAN POTASSIUM 25 MG PO TABS
25.0000 mg | ORAL_TABLET | Freq: Every day | ORAL | 1 refills | Status: DC
Start: 2020-05-14 — End: 2020-11-04

## 2020-05-14 NOTE — Progress Notes (Signed)
Clinical Summary Mr. Quinter is a 74 y.o.male seen today for follow up of the following medical problems.     1. Afib - new diagnosis noted by pcp during 12/25/19 visit - ekg from pcp reviewed, afib rate 108 - CHADS 2vasc score is at least 2 (age, HTN), started on eliquis and toprol 25mg   - s/p DCCV 01/29/20 to SR - denies any recent palpitatoins.     2. 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  Past Medical History:  Diagnosis Date  . Arthritis   . Hyperlipidemia   . Hypertension      No Known Allergies   Current Outpatient Medications  Medication Sig Dispense Refill  . alendronate (FOSAMAX) 70 MG tablet TAKE 1 TABLET BY MOUTH ONCE A WEEK 12 tablet 3  . apixaban (ELIQUIS) 5 MG TABS tablet Take 1 tablet (5 mg total) by mouth 2 (two) times daily. 60 tablet 3  . benzonatate (TESSALON) 100 MG capsule Take 1 capsule (100 mg total) by mouth 2 (two) times daily as needed for cough. 20 capsule 0  . calcium carbonate (OSCAL) 1500 (600 Ca) MG TABS tablet Take by mouth daily with breakfast.    . Cholecalciferol (VITAMIN D3) 1000 units CAPS Take by mouth.    Marland Kitchen ketoconazole (NIZORAL) 2 % cream Apply 1 application topically daily. 60 g 1  . metoprolol succinate (TOPROL-XL) 50 MG 24 hr tablet Take 1 tablet (50 mg total) by mouth daily. 30 tablet 6  . rosuvastatin (CRESTOR) 5 MG tablet TAKE 1 TABLET BY MOUTH  DAILY AT 2PM 90 tablet 1  . sacubitril-valsartan (ENTRESTO) 24-26 MG Take 1 tablet by mouth 2 (two) times daily. 60 tablet 3  . tadalafil (CIALIS) 20 MG tablet Take 1 tablet (20 mg total) by mouth as needed for erectile dysfunction. 20 tablet 5  . terazosin (HYTRIN) 2 MG capsule Take 1 capsule (2 mg total) by mouth at bedtime. 90 capsule 3  . terbinafine  (LAMISIL) 250 MG tablet Take 1 tablet by mouth once daily 90 tablet 0   No current facility-administered medications for this visit.     Past Surgical History:  Procedure Laterality Date  . CARDIOVERSION N/A 01/29/2020   Procedure: CARDIOVERSION;  Surgeon: Satira Sark, MD;  Location: [facility];  Service: Cardiovascular;  Laterality: N/A;  . HERNIA REPAIR    . KNEE ARTHROSCOPY       No Known Allergies    Family History  Problem Relation Age of Onset  . Diabetes Mother   . Hypertension Mother   . Early death Father   . Arthritis Maternal Grandmother   . Hypertension Maternal Grandfather   . Hypertension Paternal Grandmother   . Cancer Paternal Grandfather        lung     Social History Mr. Sanmiguel reports that he has never smoked. He has never used smokeless tobacco. Mr. Arts reports current alcohol use.   Review of Systems CONSTITUTIONAL: No weight loss, fever, chills, weakness or fatigue.  HEENT: Eyes: No visual loss, blurred vision, double vision or yellow sclerae.No hearing loss, sneezing, congestion, runny nose or sore throat.  SKIN: No rash or itching.  CARDIOVASCULAR: per hpi RESPIRATORY: No shortness of breath, cough or sputum.  GASTROINTESTINAL:  No anorexia, nausea, vomiting or diarrhea. No abdominal pain or blood.  GENITOURINARY: No burning on urination, no polyuria NEUROLOGICAL: No headache, dizziness, syncope, paralysis, ataxia, numbness or tingling in the extremities. No change in bowel or bladder control.  MUSCULOSKELETAL: No muscle, back pain, joint pain or stiffness.  LYMPHATICS: No enlarged nodes. No history of splenectomy.  PSYCHIATRIC: No history of depression or anxiety.  ENDOCRINOLOGIC: No reports of sweating, cold or heat intolerance. No polyuria or polydipsia.  Marland Kitchen   Physical Examination Today's Vitals   05/14/20 1355  BP: 120/72  Pulse: 63  SpO2: 97%  Weight: 174 lb (78.9 kg)  Height: 5\' 8"  (1.727 m)   Body mass index is 26.46  kg/m.  Gen: resting comfortably, no acute distress HEENT: no scleral icterus, pupils equal round and reactive, no palptable cervical adenopathy,  CV: RRR, no m/r/g, n ojvd Resp: Clear to auscultation bilaterally GI: abdomen is soft, non-tender, non-distended, normal bowel sounds, no hepatosplenomegaly MSK: extremities are warm, no edema.  Skin: warm, no rash Neuro:  no focal deficits Psych: appropriate affect   Diagnostic Studies  01/2020 echo  IMPRESSIONS    1. Left ventricular ejection fraction, by estimation, is 35 to 40%. The  left ventricle has moderately decreased function. The left ventricle  demonstrates global hypokinesis. There is mild left ventricular  hypertrophy of the posterior segment. Left  ventricular diastolic parameters are indeterminate.  2. Right ventricular systolic function is normal. The right ventricular  size is normal. There is normal pulmonary artery systolic pressure.  3. Left atrial size was moderately dilated.  4. Right atrial size was mildly dilated.  5. The mitral valve is degenerative. Mild to moderate mitral valve  regurgitation.  6. Tricuspid valve regurgitation is moderate.  7. The aortic valve is tricuspid. Aortic valve regurgitation is mild. No  aortic stenosis is present.  8. The inferior vena cava is normal in size with greater than 50%  respiratory variability, suggesting right atrial pressure of 3 mmHg.    04/2020 echo  IMPRESSIONS    1. Left ventricular ejection fraction, by estimation, is 60 to 65%. The  left ventricle has normal function. The left ventricle has no regional  wall motion abnormalities. Left ventricular diastolic parameters are  consistent with Grade I diastolic  dysfunction (impaired relaxation).  2. Right ventricular systolic function is normal. The right ventricular  size is normal. There is normal pulmonary artery systolic pressure.  3. Left atrial size was moderately dilated.  4. The mitral  valve is normal in structure. No evidence of mitral valve  regurgitation. No evidence of mitral stenosis.  5. The aortic valve is tricuspid. Aortic valve regurgitation is mild. No  aortic stenosis is present.  6. The inferior vena cava is normal in size with greater than 50%  respiratory variability, suggesting right atrial pressure of 3 mmHg.     Assessment and Plan  1. Afib - new diagnosis by pcp 12/25/19 -s/p DCCV. Has done well since - no symptoms, continue current meds  2. LV systolic dysfunction - noted by echo LVEF 35-40%, has not had clinical HF - suspect likely tachymediated - LVEF has normalized based on most recent echo - entresto expensive, since LVEF has normalized reasonable to change to just losartan alone, d/c entresto   F/u 6 months      Arnoldo Lenis, M.D.

## 2020-05-14 NOTE — Patient Instructions (Signed)
Your physician wants you to follow-up in: [place] will receive a reminder letter in the mail two months in advance. If you don't receive a letter, please call our office to schedule the follow-up appointment.  Your physician has recommended you make the following change in your medication:   STOP ENTRESTO   START LOSARTAN 25 MG DAILY   Thank you for choosing [facility]!!

## 2020-05-14 NOTE — Addendum Note (Signed)
Addended by: Julian Hy T on: 05/14/2020 02:34 PM   Modules accepted: Orders

## 2020-06-03 ENCOUNTER — Ambulatory Visit: Payer: Medicare Other | Admitting: Cardiology

## 2020-06-17 ENCOUNTER — Other Ambulatory Visit: Payer: Self-pay | Admitting: Cardiology

## 2020-06-30 DIAGNOSIS — L280 Lichen simplex chronicus: Secondary | ICD-10-CM | POA: Diagnosis not present

## 2020-06-30 DIAGNOSIS — L821 Other seborrheic keratosis: Secondary | ICD-10-CM | POA: Diagnosis not present

## 2020-06-30 DIAGNOSIS — D485 Neoplasm of uncertain behavior of skin: Secondary | ICD-10-CM | POA: Diagnosis not present

## 2020-07-22 DIAGNOSIS — L280 Lichen simplex chronicus: Secondary | ICD-10-CM | POA: Diagnosis not present

## 2020-07-27 ENCOUNTER — Other Ambulatory Visit: Payer: Self-pay | Admitting: Family Medicine

## 2020-07-27 DIAGNOSIS — E782 Mixed hyperlipidemia: Secondary | ICD-10-CM

## 2020-08-20 ENCOUNTER — Other Ambulatory Visit: Payer: Medicare Other

## 2020-08-20 ENCOUNTER — Other Ambulatory Visit: Payer: Self-pay

## 2020-08-20 DIAGNOSIS — C61 Malignant neoplasm of prostate: Secondary | ICD-10-CM

## 2020-08-20 NOTE — Addendum Note (Signed)
Addended by: Pollyann Kennedy F on: 08/20/2020 01:29 PM   Modules accepted: Orders

## 2020-08-21 LAB — PSA: Prostate Specific Ag, Serum: 9.8 ng/mL — ABNORMAL HIGH (ref 0.0–4.0)

## 2020-08-25 ENCOUNTER — Encounter: Payer: Self-pay | Admitting: Urology

## 2020-08-25 ENCOUNTER — Other Ambulatory Visit: Payer: Self-pay

## 2020-08-25 ENCOUNTER — Ambulatory Visit (INDEPENDENT_AMBULATORY_CARE_PROVIDER_SITE_OTHER): Payer: Medicare Other | Admitting: Urology

## 2020-08-25 VITALS — BP 122/67 | HR 74 | Temp 98.6°F | Ht 68.5 in | Wt 172.0 lb

## 2020-08-25 DIAGNOSIS — N138 Other obstructive and reflux uropathy: Secondary | ICD-10-CM

## 2020-08-25 DIAGNOSIS — C61 Malignant neoplasm of prostate: Secondary | ICD-10-CM | POA: Diagnosis not present

## 2020-08-25 DIAGNOSIS — N401 Enlarged prostate with lower urinary tract symptoms: Secondary | ICD-10-CM | POA: Diagnosis not present

## 2020-08-25 DIAGNOSIS — N5201 Erectile dysfunction due to arterial insufficiency: Secondary | ICD-10-CM | POA: Diagnosis not present

## 2020-08-25 DIAGNOSIS — R3912 Poor urinary stream: Secondary | ICD-10-CM

## 2020-08-25 LAB — URINALYSIS, ROUTINE W REFLEX MICROSCOPIC
Bilirubin, UA: NEGATIVE
Glucose, UA: NEGATIVE
Ketones, UA: NEGATIVE
Leukocytes,UA: NEGATIVE
Nitrite, UA: NEGATIVE
Protein,UA: NEGATIVE
RBC, UA: NEGATIVE
Specific Gravity, UA: 1.025 (ref 1.005–1.030)
Urobilinogen, Ur: 0.2 mg/dL (ref 0.2–1.0)
pH, UA: 6 (ref 5.0–7.5)

## 2020-08-25 LAB — BLADDER SCAN AMB NON-IMAGING: Scan Result: 23

## 2020-08-25 MED ORDER — TERAZOSIN HCL 2 MG PO CAPS: 2.0000 mg | ORAL_CAPSULE | Freq: Every day | ORAL | 3 refills | Status: DC

## 2020-08-25 MED ORDER — TADALAFIL 20 MG PO TABS: 20.0000 mg | ORAL_TABLET | ORAL | 5 refills | Status: DC | PRN

## 2020-08-25 NOTE — Patient Instructions (Signed)
Prostate Cancer  The prostate is a male gland that helps make semen. Prostate cancer is when abnormal cells grow in this gland. Follow these instructions at home:  Take over-the-counter and prescription medicines only as told by your doctor.  Eat a healthy diet.  Get plenty of sleep.  Ask your doctor for help to find a support group for men with prostate cancer.  Keep all follow-up visits as told by your doctor. This is important.  If you have to go to the hospital, let your cancer doctor (oncologist) know.  Touch, hold, hug, and caress your partner to continue to show sexual feelings. Contact a doctor if:  You have trouble peeing (urinating).  You have blood in your pee (urine).  You have pain in your hips, back, or chest. Get help right away if:  You have weakness in your legs.  You lose feeling (have numbness) in your legs.  You cannot control your pee or your poop (stool).  You have trouble breathing.  You have sudden pain in your chest.  You have chills or a fever. Summary  The prostate is a male gland that helps make semen. Prostate cancer is when abnormal cells grow in this gland.  Ask your doctor for help to find a support group for men with prostate cancer.  Contact a doctor if you have problems peeing or have any new pain that you did not have before. This information is not intended to replace advice given to you by your health care provider. Make sure you discuss any questions you have with your health care provider. Document Revised: 08/03/2017 Document Reviewed: 05/01/2016 Elsevier Patient Education  2020 Elsevier Inc.  

## 2020-08-25 NOTE — Progress Notes (Signed)
08/25/2020 2:57 PM   David Schwartz 27-Dec-1945 [id number]  Referring provider: Dettinger, David Kaufmann, MD [place],  [place] [address]  followup prostate cancer  HPI: [name] is 74yo here for followup for prostate cancer, BPH and ED. PSA is stable at 9.8, He has mild to moderate LUTS on terazosin daily. Nocturia 1-2x, stream intermittently strong. NO urinary frequency or urgency. He has intermittent success with tadalafil 20mg  prn   PMH: Past Medical History:  Diagnosis Date  . Arthritis   . Hyperlipidemia   . Hypertension     Surgical History: Past Surgical History:  Procedure Laterality Date  . CARDIOVERSION N/A 01/29/2020   Procedure: CARDIOVERSION;  Surgeon: Satira Sark, MD;  Location: [facility];  Service: Cardiovascular;  Laterality: N/A;  . HERNIA REPAIR    . KNEE ARTHROSCOPY      Home Medications:  Allergies as of 08/25/2020   No Known Allergies     Medication List       Accurate as of August 25, 2020  2:57 PM. If you have any questions, ask your nurse or doctor.        alendronate 70 MG tablet Commonly known as: FOSAMAX TAKE 1 TABLET BY MOUTH ONCE A WEEK   benzonatate 100 MG capsule Commonly known as: TESSALON Take 1 capsule (100 mg total) by mouth 2 (two) times daily as needed for cough.   calcium carbonate 1500 (600 Ca) MG Tabs tablet Commonly known as: OSCAL Take by mouth daily with breakfast.   Eliquis 5 MG Tabs tablet Generic drug: apixaban Take 1 tablet by mouth twice daily   ketoconazole 2 % cream Commonly known as: NIZORAL Apply 1 application topically daily.   losartan 25 MG tablet Commonly known as: COZAAR Take 1 tablet (25 mg total) by mouth daily.   metoprolol succinate 50 MG 24 hr tablet Commonly known as: TOPROL-XL Take 1 tablet (50 mg total) by mouth daily.   rosuvastatin 5 MG tablet Commonly known as: CRESTOR TAKE 1 TABLET BY MOUTH  DAILY AT 2 PM   tadalafil 20 MG tablet Commonly known as:  CIALIS Take 1 tablet (20 mg total) by mouth as needed for erectile dysfunction.   terazosin 2 MG capsule Commonly known as: HYTRIN Take 1 capsule (2 mg total) by mouth at bedtime.   terbinafine 250 MG tablet Commonly known as: LAMISIL Take 1 tablet by mouth once daily   Vitamin D3 25 MCG (1000 UT) Caps Take by mouth.       Allergies: No Known Allergies  Family History: Family History  Problem Relation Age of Onset  . Diabetes Mother   . Hypertension Mother   . Early death Father   . Arthritis Maternal Grandmother   . Hypertension Maternal Grandfather   . Hypertension Paternal Grandmother   . Cancer Paternal Grandfather        lung    Social History:  reports that he has never smoked. He has never used smokeless tobacco. He reports current alcohol use. He reports that he does not use drugs.  ROS: All other review of systems were reviewed and are negative except what is noted above in HPI  Physical Exam: BP 122/67   Pulse 74   Temp 98.6 F (37 C)   Ht 5' 8.5" (1.74 m)   Wt 172 lb (78 kg)   BMI 25.77 kg/m   Constitutional:  Alert and oriented, No acute distress. HEENT: [place] AT, moist mucus membranes.  Trachea midline,  no masses. Cardiovascular: No clubbing, cyanosis, or edema. Respiratory: Normal respiratory effort, no increased work of breathing. GI: Abdomen is soft, nontender, nondistended, no abdominal masses GU: No CVA tenderness.  Lymph: No cervical or inguinal lymphadenopathy. Skin: No rashes, bruises or suspicious lesions. Neurologic: Grossly intact, no focal deficits, moving all 4 extremities. Psychiatric: Normal mood and affect.  Laboratory Data: Lab Results  Component Value Date   WBC 5.9 04/08/2020   HGB 14.8 04/08/2020   HCT 40.5 04/08/2020   MCV 90 04/08/2020   PLT 190 04/08/2020    Lab Results  Component Value Date   CREATININE 0.78 04/08/2020    No results found for: PSA  No results found for: TESTOSTERONE  No results found for:  HGBA1C  Urinalysis No results found for: COLORURINE, APPEARANCEUR, LABSPEC, PHURINE, GLUCOSEU, HGBUR, BILIRUBINUR, KETONESUR, PROTEINUR, UROBILINOGEN, NITRITE, LEUKOCYTESUR  No results found for: LABMICR, [place], RBCUA, LABEPIT, MUCUS, BACTERIA  Pertinent Imaging:  No results found for this or any previous visit.  No results found for this or any previous visit.  No results found for this or any previous visit.  No results found for this or any previous visit.  No results found for this or any previous visit.  No results found for this or any previous visit.  No results found for this or any previous visit.  No results found for this or any previous visit.   Assessment & Plan:    1. Prostate cancer ([place]) RTC 6 months with PSA - Urinalysis, Routine w reflex microscopic - BLADDER SCAN AMB NON-IMAGING  2. Benign prostatic hyperplasia with urinary obstruction, weak urinary stream -continue terazosin  3. Erectile dysfunction due to arterial insufficiency -Tadalafil 20mg  prn   No follow-ups on file.  Nicolette Bang, MD  [facility] [place]

## 2020-08-25 NOTE — Progress Notes (Signed)
Bladder Scan Patient can void: 23 ml Performed By: Hayes Czaja,lpn    Urological Symptom Review  Patient is experiencing the following symptoms: Frequent urination Hard to postpone urination Get up at night to urinate Weak stream Erection problems (male only)   Review of Systems  Gastrointestinal (upper)  : Negative for upper GI symptoms  Gastrointestinal (lower) : Negative for lower GI symptoms  Constitutional : Negative for symptoms  Skin: Negative for skin symptoms  Eyes: Negative for eye symptoms  Ear/Nose/Throat : Negative for Ear/Nose/Throat symptoms  Hematologic/Lymphatic: Easy bruising  Cardiovascular : Negative for cardiovascular symptoms  Respiratory : Negative for respiratory symptoms  Endocrine: Negative for endocrine symptoms  Musculoskeletal: Back pain Joint pain  Neurological: Negative for neurological symptoms  Psychologic: Negative for psychiatric symptoms

## 2020-09-07 ENCOUNTER — Other Ambulatory Visit: Payer: Self-pay | Admitting: Cardiology

## 2020-09-07 DIAGNOSIS — R000 Tachycardia, unspecified: Secondary | ICD-10-CM

## 2020-09-07 DIAGNOSIS — I4891 Unspecified atrial fibrillation: Secondary | ICD-10-CM

## 2020-10-08 ENCOUNTER — Other Ambulatory Visit: Payer: Self-pay

## 2020-10-08 ENCOUNTER — Ambulatory Visit (INDEPENDENT_AMBULATORY_CARE_PROVIDER_SITE_OTHER): Payer: Medicare Other | Admitting: Family Medicine

## 2020-10-08 ENCOUNTER — Encounter: Payer: Self-pay | Admitting: Family Medicine

## 2020-10-08 VITALS — BP 126/75 | HR 59 | Ht 68.5 in | Wt 172.0 lb

## 2020-10-08 DIAGNOSIS — I4819 Other persistent atrial fibrillation: Secondary | ICD-10-CM

## 2020-10-08 DIAGNOSIS — E782 Mixed hyperlipidemia: Secondary | ICD-10-CM

## 2020-10-08 DIAGNOSIS — I10 Essential (primary) hypertension: Secondary | ICD-10-CM | POA: Diagnosis not present

## 2020-10-08 LAB — CMP14+EGFR
ALT: 16 IU/L (ref 0–44)
AST: 18 IU/L (ref 0–40)
Albumin/Globulin Ratio: 2.1 (ref 1.2–2.2)
Albumin: 4.4 g/dL (ref 3.7–4.7)
Alkaline Phosphatase: 45 IU/L (ref 44–121)
BUN/Creatinine Ratio: 22 (ref 10–24)
BUN: 15 mg/dL (ref 8–27)
Bilirubin Total: 0.5 mg/dL (ref 0.0–1.2)
CO2: 24 mmol/L (ref 20–29)
Calcium: 9.1 mg/dL (ref 8.6–10.2)
Chloride: 104 mmol/L (ref 96–106)
Creatinine, Ser: 0.67 mg/dL — ABNORMAL LOW (ref 0.76–1.27)
GFR calc Af Amer: 109 mL/min/{1.73_m2} (ref >59)
GFR calc non Af Amer: 95 mL/min/{1.73_m2} (ref >59)
Globulin, Total: 2.1 g/dL (ref 1.5–4.5)
Glucose: 91 mg/dL (ref 65–99)
Potassium: 4.5 mmol/L (ref 3.5–5.2)
Sodium: 142 mmol/L (ref 134–144)
Total Protein: 6.5 g/dL (ref 6.0–8.5)

## 2020-10-08 LAB — LIPID PANEL
Chol/HDL Ratio: 3.8 ratio (ref 0.0–5.0)
Cholesterol, Total: 168 mg/dL (ref 100–199)
HDL: 44 mg/dL (ref >39)
LDL Chol Calc (NIH): 98 mg/dL (ref 0–99)
Triglycerides: 147 mg/dL (ref 0–149)
VLDL Cholesterol Cal: 26 mg/dL (ref 5–40)

## 2020-10-08 LAB — CBC WITH DIFFERENTIAL/PLATELET
Basophils Absolute: 0.1 10*3/uL (ref 0.0–0.2)
Basos: 1 %
EOS (ABSOLUTE): 0.1 10*3/uL (ref 0.0–0.4)
Eos: 2 %
Hematocrit: 41.6 % (ref 37.5–51.0)
Hemoglobin: 14.5 g/dL (ref 13.0–17.7)
Immature Grans (Abs): 0 10*3/uL (ref 0.0–0.1)
Immature Granulocytes: 0 %
Lymphocytes Absolute: 1.7 10*3/uL (ref 0.7–3.1)
Lymphs: 26 %
MCH: 31.9 pg (ref 26.6–33.0)
MCHC: 34.9 g/dL (ref 31.5–35.7)
MCV: 92 fL (ref 79–97)
Monocytes Absolute: 0.5 10*3/uL (ref 0.1–0.9)
Monocytes: 8 %
Neutrophils Absolute: 4 10*3/uL (ref 1.4–7.0)
Neutrophils: 63 %
Platelets: 190 10*3/uL (ref 150–450)
RBC: 4.54 x10E6/uL (ref 4.14–5.80)
RDW: 12.2 % (ref 11.6–15.4)
WBC: 6.4 10*3/uL (ref 3.4–10.8)

## 2020-10-08 MED ORDER — TRIAMCINOLONE ACETONIDE 0.1 % EX CREA
1.0000 | TOPICAL_CREAM | Freq: Two times a day (BID) | CUTANEOUS | 3 refills | Status: DC
Start: 2020-10-08 — End: 2022-10-12

## 2020-10-08 MED ORDER — ROSUVASTATIN CALCIUM 5 MG PO TABS: ORAL_TABLET | ORAL | 3 refills | Status: DC

## 2020-10-08 MED ORDER — ALENDRONATE SODIUM 70 MG PO TABS: 70.0000 mg | ORAL_TABLET | ORAL | 3 refills | Status: DC

## 2020-10-08 NOTE — Addendum Note (Signed)
Addended by: Caryl Pina on: 10/08/2020 10:18 AM   Modules accepted: Orders

## 2020-10-08 NOTE — Progress Notes (Signed)
BP 126/75   Pulse (!) 59   Ht 5' 8.5" (1.74 m)   Wt 172 lb (78 kg)   SpO2 98%   BMI 25.77 kg/m    Subjective:   Patient ID: David Schwartz, male    DOB: Sep 04, 1946, 75 y.o.   MRN: [id number]  HPI: David Schwartz is a 75 y.o. male presenting on 10/08/2020 for Medical Management of Chronic Issues, Hyperlipidemia, and Hypertension   HPI Hypertension and A. fib Patient is currently on Eliquis and metoprolol and losartan, and their blood pressure today is 126/75. Patient denies any lightheadedness or dizziness. Patient denies headaches, blurred vision, chest pains, shortness of breath, or weakness. Denies any side effects from medication and is content with current medication.   Hyperlipidemia Patient is coming in for recheck of his hyperlipidemia. The patient is currently taking Crestor. They deny any issues with myalgias or history of liver damage from it. They deny any focal numbness or weakness or chest pain.   Relevant past medical, surgical, family and social history reviewed and updated as indicated. Interim medical history since our last visit reviewed. Allergies and medications reviewed and updated.  Review of Systems  Constitutional: Negative for chills and fever.  Eyes: Negative for visual disturbance.  Respiratory: Negative for shortness of breath and wheezing.   Cardiovascular: Negative for chest pain and leg swelling.  Musculoskeletal: Negative for back pain and gait problem.  Skin: Negative for rash.  Neurological: Negative for dizziness, weakness and light-headedness.  All other systems reviewed and are negative.   Per HPI unless specifically indicated above   Allergies as of 10/08/2020   No Known Allergies     Medication List       Accurate as of October 08, 2020  9:35 AM. If you have any questions, ask your nurse or doctor.        STOP taking these medications   terbinafine 250 MG tablet Commonly known as: LAMISIL Stopped by: Fransisca Kaufmann Narek Kniss,  MD     TAKE these medications   alendronate 70 MG tablet Commonly known as: FOSAMAX TAKE 1 TABLET BY MOUTH ONCE A WEEK   benzonatate 100 MG capsule Commonly known as: TESSALON Take 1 capsule (100 mg total) by mouth 2 (two) times daily as needed for cough.   calcium carbonate 1500 (600 Ca) MG Tabs tablet Commonly known as: OSCAL Take by mouth daily with breakfast.   Eliquis 5 MG Tabs tablet Generic drug: apixaban Take 1 tablet by mouth twice daily   ketoconazole 2 % cream Commonly known as: NIZORAL Apply 1 application topically daily.   losartan 25 MG tablet Commonly known as: COZAAR Take 1 tablet (25 mg total) by mouth daily.   metoprolol succinate 50 MG 24 hr tablet Commonly known as: TOPROL-XL Take 1 tablet by mouth once daily   rosuvastatin 5 MG tablet Commonly known as: CRESTOR TAKE 1 TABLET BY MOUTH  DAILY AT 2 PM   tadalafil 20 MG tablet Commonly known as: CIALIS Take 1 tablet (20 mg total) by mouth as needed for erectile dysfunction.   terazosin 2 MG capsule Commonly known as: HYTRIN Take 1 capsule (2 mg total) by mouth at bedtime.   Vitamin D3 25 MCG (1000 UT) Caps Take by mouth.        Objective:   BP 126/75   Pulse (!) 59   Ht 5' 8.5" (1.74 m)   Wt 172 lb (78 kg)   SpO2 98%   BMI 25.77 kg/m  Wt Readings from Last 3 Encounters:  10/08/20 172 lb (78 kg)  08/25/20 172 lb (78 kg)  05/14/20 174 lb (78.9 kg)    Physical Exam Vitals and nursing note reviewed.  Constitutional:      General: He is not in acute distress.    Appearance: He is well-developed and well-nourished. He is not diaphoretic.  Eyes:     General: No scleral icterus.    Extraocular Movements: EOM normal.     Conjunctiva/sclera: Conjunctivae normal.  Neck:     Thyroid: No thyromegaly.  Cardiovascular:     Rate and Rhythm: Normal rate and regular rhythm.     Pulses: Intact distal pulses.     Heart sounds: Normal heart sounds. No murmur heard.   Pulmonary:      Effort: Pulmonary effort is normal. No respiratory distress.     Breath sounds: Normal breath sounds. No wheezing.  Musculoskeletal:        General: No edema. Normal range of motion.     Cervical back: Neck supple.  Lymphadenopathy:     Cervical: No cervical adenopathy.  Skin:    General: Skin is warm and dry.     Findings: No rash.  Neurological:     Mental Status: He is alert and oriented to person, place, and time.     Coordination: Coordination normal.  Psychiatric:        Mood and Affect: Mood and affect normal.        Behavior: Behavior normal.       Assessment & Plan:   Problem List Items Addressed This Visit      Cardiovascular and Mediastinum   Hypertension - Primary   Relevant Medications   rosuvastatin (CRESTOR) 5 MG tablet   Other Relevant Orders   CBC with Differential/Platelet   CMP14+EGFR   Persistent atrial fibrillation (HCC)   Relevant Medications   rosuvastatin (CRESTOR) 5 MG tablet     Other   Hyperlipemia   Relevant Medications   rosuvastatin (CRESTOR) 5 MG tablet   Other Relevant Orders   CMP14+EGFR   Lipid panel      Continue current medication, no changes.  Recheck blood work today. Follow up plan: Return in about 6 months (around 04/07/2021), or if symptoms worsen or fail to improve, for Hypertension.  Counseling provided for all of the vaccine components No orders of the defined types were placed in this encounter.   Caryl Pina, MD [facility] 10/08/2020, 9:35 AM

## 2020-11-04 ENCOUNTER — Other Ambulatory Visit: Payer: Self-pay | Admitting: Cardiology

## 2020-11-12 ENCOUNTER — Other Ambulatory Visit: Payer: Self-pay | Admitting: Urology

## 2020-11-12 DIAGNOSIS — R3912 Poor urinary stream: Secondary | ICD-10-CM

## 2020-11-26 ENCOUNTER — Encounter: Payer: Self-pay | Admitting: Cardiology

## 2020-11-26 ENCOUNTER — Ambulatory Visit (INDEPENDENT_AMBULATORY_CARE_PROVIDER_SITE_OTHER): Payer: Medicare Other | Admitting: Cardiology

## 2020-11-26 ENCOUNTER — Other Ambulatory Visit: Payer: Self-pay

## 2020-11-26 VITALS — BP 120/70 | HR 68 | Ht 68.0 in | Wt 175.8 lb

## 2020-11-26 DIAGNOSIS — I519 Heart disease, unspecified: Secondary | ICD-10-CM

## 2020-11-26 DIAGNOSIS — I4891 Unspecified atrial fibrillation: Secondary | ICD-10-CM

## 2020-11-26 NOTE — Progress Notes (Signed)
Clinical Summary David Schwartz is a 75 y.o.male  seen today for follow up of the following medical problems.    1. Afib - new diagnosis noted by pcp during 12/25/19 visit - ekg from pcp reviewed, afib rate 108 - CHADS 2vasc score is at least 2 (age, HTN), started on eliquis and toprol 25mg   - s/p DCCV 01/29/20 to SR  - no recent palpitations - compliant with meds - no bleeding on eliquis.   2. 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       Past Medical History:  Diagnosis Date  . Arthritis   . Hyperlipidemia   . Hypertension      No Known Allergies   Current Outpatient Medications  Medication Sig Dispense Refill  . alendronate (FOSAMAX) 70 MG tablet Take 1 tablet (70 mg total) by mouth once a week. Take with a full glass of water on an empty stomach. 12 tablet 3  . benzonatate (TESSALON) 100 MG capsule Take 1 capsule (100 mg total) by mouth 2 (two) times daily as needed for cough. 20 capsule 0  . calcium carbonate (OSCAL) 1500 (600 Ca) MG TABS tablet Take by mouth daily with breakfast.    . Cholecalciferol (VITAMIN D3) 1000 units CAPS Take by mouth.    [place] 5 MG TABS tablet Take 1 tablet by mouth twice daily 60 tablet 6  . ketoconazole (NIZORAL) 2 % cream Apply 1 application topically daily. 60 g 1  . losartan (COZAAR) 25 MG tablet Take 1 tablet by mouth once daily 90 tablet 1  . metoprolol succinate (TOPROL-XL) 50 MG 24 hr tablet Take 1 tablet by mouth once daily 90 tablet 3  . rosuvastatin (CRESTOR) 5 MG tablet TAKE 1 TABLET BY MOUTH  DAILY AT 2 PM 90 tablet 3  . tadalafil (CIALIS) 20 MG tablet Take 1 tablet (20 mg total) by mouth as needed for erectile dysfunction. 20 tablet 5  . terazosin (HYTRIN) 2 MG capsule TAKE 1 CAPSULE BY MOUTH AT  BEDTIME 90 capsule 3  . triamcinolone  (KENALOG) 0.1 % Apply 1 application topically 2 (two) times daily. 80 g 3   No current facility-administered medications for this visit.     Past Surgical History:  Procedure Laterality Date  . CARDIOVERSION N/A 01/29/2020   Procedure: CARDIOVERSION;  Surgeon: Satira Sark, MD;  Location: [facility];  Service: Cardiovascular;  Laterality: N/A;  . HERNIA REPAIR    . KNEE ARTHROSCOPY       No Known Allergies    Family History  Problem Relation Age of Onset  . Diabetes Mother   . Hypertension Mother   . Early death Father   . Arthritis Maternal Grandmother   . Hypertension Maternal Grandfather   . Hypertension Paternal Grandmother   . Cancer Paternal Grandfather        lung     Social History David Schwartz reports that he has never smoked. He has never used smokeless tobacco. David Schwartz reports current alcohol use.   Review of Systems CONSTITUTIONAL: No weight loss, fever, chills, weakness or fatigue.  HEENT: Eyes: No visual loss, blurred vision, double vision or yellow sclerae.No hearing loss, sneezing, congestion, runny nose or sore throat.  SKIN: No rash or itching.  CARDIOVASCULAR: per hpi RESPIRATORY: No shortness of breath,  cough or sputum.  GASTROINTESTINAL: No anorexia, nausea, vomiting or diarrhea. No abdominal pain or blood.  GENITOURINARY: No burning on urination, no polyuria NEUROLOGICAL: No headache, dizziness, syncope, paralysis, ataxia, numbness or tingling in the extremities. No change in bowel or bladder control.  MUSCULOSKELETAL: No muscle, back pain, joint pain or stiffness.  LYMPHATICS: No enlarged nodes. No history of splenectomy.  PSYCHIATRIC: No history of depression or anxiety.  ENDOCRINOLOGIC: No reports of sweating, cold or heat intolerance. No polyuria or polydipsia.  Marland Kitchen   Physical Examination Today's Vitals   11/26/20 1332  BP: 120/70  Pulse: 68  SpO2: 98%  Weight: 175 lb 12.8 oz (79.7 kg)  Height: 5\' 8"  (1.727 m)   Body mass index  is 26.73 kg/m.  Gen: resting comfortably, no acute distress HEENT: no scleral icterus, pupils equal round and reactive, no palptable cervical adenopathy,  CV: RRR, no m/r/g, no jvd Resp: Clear to auscultation bilaterally GI: abdomen is soft, non-tender, non-distended, normal bowel sounds, no hepatosplenomegaly MSK: extremities are warm, no edema.  Skin: warm, no rash Neuro:  no focal deficits Psych: appropriate affect   Diagnostic Studies 01/2020 echo  IMPRESSIONS    1. Left ventricular ejection fraction, by estimation, is 35 to 40%. The  left ventricle has moderately decreased function. The left ventricle  demonstrates global hypokinesis. There is mild left ventricular  hypertrophy of the posterior segment. Left  ventricular diastolic parameters are indeterminate.  2. Right ventricular systolic function is normal. The right ventricular  size is normal. There is normal pulmonary artery systolic pressure.  3. Left atrial size was moderately dilated.  4. Right atrial size was mildly dilated.  5. The mitral valve is degenerative. Mild to moderate mitral valve  regurgitation.  6. Tricuspid valve regurgitation is moderate.  7. The aortic valve is tricuspid. Aortic valve regurgitation is mild. No  aortic stenosis is present.  8. The inferior vena cava is normal in size with greater than 50%  respiratory variability, suggesting right atrial pressure of 3 mmHg.   04/2020 echo  IMPRESSIONS    1. Left ventricular ejection fraction, by estimation, is 60 to 65%. The  left ventricle has normal function. The left ventricle has no regional  wall motion abnormalities. Left ventricular diastolic parameters are  consistent with Grade I diastolic  dysfunction (impaired relaxation).  2. Right ventricular systolic function is normal. The right ventricular  size is normal. There is normal pulmonary artery systolic pressure.  3. Left atrial size was moderately dilated.  4. The  mitral valve is normal in structure. No evidence of mitral valve  regurgitation. No evidence of mitral stenosis.  5. The aortic valve is tricuspid. Aortic valve regurgitation is mild. No  aortic stenosis is present.  6. The inferior vena cava is normal in size with greater than 50%  respiratory variability, suggesting right atrial pressure of 3 mmHg.   Assessment and Plan   1. Afib - new diagnosis by pcp 12/25/19 -s/p DCCV. Remains in SR nearly 1 year later, no symptoms - continue current meds  2. 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

## 2020-11-26 NOTE — Patient Instructions (Signed)
Your physician recommends that you schedule a follow-up appointment in: 6 MONTHS WITH DR BRANCH  Your physician recommends that you continue on your current medications as directed. Please refer to the Current Medication list given to you today.  Thank you for choosing [facility]!!    

## 2020-12-23 ENCOUNTER — Encounter: Payer: Self-pay | Admitting: Family

## 2020-12-23 ENCOUNTER — Ambulatory Visit (INDEPENDENT_AMBULATORY_CARE_PROVIDER_SITE_OTHER): Payer: Medicare Other | Admitting: Family

## 2020-12-23 DIAGNOSIS — R197 Diarrhea, unspecified: Secondary | ICD-10-CM | POA: Diagnosis not present

## 2020-12-23 MED ORDER — ONDANSETRON HCL 4 MG PO TABS: 4.0000 mg | ORAL_TABLET | Freq: Three times a day (TID) | ORAL | 0 refills | Status: DC | PRN

## 2020-12-23 MED ORDER — LOPERAMIDE HCL 2 MG PO TABS: 2.0000 mg | ORAL_TABLET | Freq: Four times a day (QID) | ORAL | 0 refills | Status: DC | PRN

## 2020-12-23 NOTE — Progress Notes (Signed)
   Virtual Visit  Note Due to COVID-19 pandemic this visit was conducted virtually. This visit type was conducted due to national recommendations for restrictions regarding the COVID-19 Pandemic (e.g. social distancing, sheltering in place) in an effort to limit this patient's exposure and mitigate transmission in our community. All issues noted in this document were discussed and addressed.  A physical exam was not performed with this format.  I connected with David Schwartz on 12/23/20 at 12:45 pm  by telephone and verified that I am speaking with the correct person using two identifiers. David Schwartz is currently located at home and wifew is currently with him  during visit. The provider, Evelina Dun, FNP is located in their office at time of visit.  I discussed the limitations, risks, security and privacy concerns of performing an evaluation and management service by telephone and the availability of in person appointments. I also discussed with the patient that there may be a patient responsible charge related to this service. The patient expressed understanding and agreed to proceed.   History and Present Illness:  Diarrhea  This is a new problem. The current episode started yesterday. The problem occurs 5 to 10 times per day. The problem has been waxing and waning. The stool consistency is described as watery. Associated symptoms include abdominal pain and bloating. Pertinent negatives include no chills, coughing, fever, headaches, increased  flatus or vomiting. Risk factors include ill contacts. He has tried anti-motility drug for the symptoms. The treatment provided mild relief.      Review of Systems  Constitutional: Negative for chills and fever.  Respiratory: Negative for cough.   Gastrointestinal: Positive for abdominal pain, bloating and diarrhea. Negative for flatus and vomiting.  Neurological: Negative for headaches.     Observations/Objective: No SOB Or distress  noted   Assessment and Plan: 1. Diarrhea, unspecified type Force fluids Bland diet Imodium as needed  Zofran if having nausea  RTO if symptoms worsen or do not improve  - ondansetron (ZOFRAN) 4 MG tablet; Take 1 tablet (4 mg total) by mouth every 8 (eight) hours as needed for nausea or vomiting.  Dispense: 20 tablet; Refill: 0 - loperamide (IMODIUM A-D) 2 MG tablet; Take 1 tablet (2 mg total) by mouth 4 (four) times daily as needed for diarrhea or loose stools.  Dispense: 30 tablet; Refill: 0    I discussed the assessment and treatment plan with the patient. The patient was provided an opportunity to ask questions and all were answered. The patient agreed with the plan and demonstrated an understanding of the instructions.   The patient was advised to call back or seek an in-person evaluation if the symptoms worsen or if the condition fails to improve as anticipated.  The above assessment and management plan was discussed with the patient. The patient verbalized understanding of and has agreed to the management plan. Patient is aware to call the clinic if symptoms persist or worsen. Patient is aware when to return to the clinic for a follow-up visit. Patient educated on when it is appropriate to go to the emergency department.   Time call ended:  12: 56 pm    I provided 11 minutes of  non face-to-face time during this encounter.    Evelina Dun, FNP

## 2021-01-04 ENCOUNTER — Ambulatory Visit (INDEPENDENT_AMBULATORY_CARE_PROVIDER_SITE_OTHER): Payer: Medicare Other

## 2021-01-04 VITALS — Ht 68.0 in | Wt 172.0 lb

## 2021-01-04 DIAGNOSIS — Z0000 Encounter for general adult medical examination without abnormal findings: Secondary | ICD-10-CM

## 2021-01-04 NOTE — Patient Instructions (Signed)
[name] , Thank you for taking time to come for your Medicare Wellness Visit. I appreciate your ongoing commitment to your health goals. Please review the following plan we discussed and let me know if I can assist you in the future.   Screening recommendations/referrals: Colonoscopy: Done 10/29/2012 - Repeat 10 years Recommended yearly ophthalmology/optometry visit for glaucoma screening and checkup Recommended yearly dental visit for hygiene and checkup  Vaccinations: Influenza vaccine: Done 07/01/2020 - Repeat every fall Pneumococcal vaccine: Done 08/10/2011 & 04/08/2020 Tdap vaccine: Done 03/31/2016 - Repeat 10 years Shingles vaccine: Done at [facility] - we need dates please   Covid-19: Done 10/18/19, 11/15/19 - we need booster dates  Advanced directives: Please bring a copy of your health care power of attorney and living will to the office to be added to your chart at your convenience.  Conditions/risks identified: Aim for 30 minutes of exercise or brisk walking each day, drink 6-8 glasses of water and eat lots of fruits and vegetables.   Next appointment: Follow up in one year for your annual wellness visit.   Preventive Care 61 Years and Older, Male  Preventive care refers to lifestyle choices and visits with your health care provider that can promote health and wellness. What does preventive care include?  A yearly physical exam. This is also called an annual well check.  Dental exams once or twice a year.  Routine eye exams. Ask your health care provider how often you should have your eyes checked.  Personal lifestyle choices, including:  Daily care of your teeth and gums.  Regular physical activity.  Eating a healthy diet.  Avoiding tobacco and drug use.  Limiting alcohol use.  Practicing safe sex.  Taking low doses of aspirin every day.  Taking vitamin and mineral supplements as recommended by your health care provider. What happens during an annual well  check? The services and screenings done by your health care provider during your annual well check will depend on your age, overall health, lifestyle risk factors, and family history of disease. Counseling  Your health care provider may ask you questions about your:  Alcohol use.  Tobacco use.  Drug use.  Emotional well-being.  Home and relationship well-being.  Sexual activity.  Eating habits.  History of falls.  Memory and ability to understand (cognition).  Work and work Statistician. Screening  You may have the following tests or measurements:  Height, weight, and BMI.  Blood pressure.  Lipid and cholesterol levels. These may be checked every 5 years, or more frequently if you are over 62 years old.  Skin check.  Lung cancer screening. You may have this screening every year starting at age 75 if you have a 30-pack-year history of smoking and currently smoke or have quit within the past 15 years.  Fecal occult blood test (FOBT) of the stool. You may have this test every year starting at age 49.  Flexible sigmoidoscopy or colonoscopy. You may have a sigmoidoscopy every 5 years or a colonoscopy every 10 years starting at age 62.  Prostate cancer screening. Recommendations will vary depending on your family history and other risks.  Hepatitis C blood test.  Hepatitis B blood test.  Sexually transmitted disease (STD) testing.  Diabetes screening. This is done by checking your blood sugar (glucose) after you have not eaten for a while (fasting). You may have this done every 1-3 years.  Abdominal aortic aneurysm (AAA) screening. You may need this if you are a current or former  smoker.  Osteoporosis. You may be screened starting at age 36 if you are at high risk. Talk with your health care provider about your test results, treatment options, and if necessary, the need for more tests. Vaccines  Your health care provider may recommend certain vaccines, such  as:  Influenza vaccine. This is recommended every year.  Tetanus, diphtheria, and acellular pertussis (Tdap, Td) vaccine. You may need a Td booster every 10 years.  Zoster vaccine. You may need this after age 52.  Pneumococcal 13-valent conjugate (PCV13) vaccine. One dose is recommended after age 53.  Pneumococcal polysaccharide (PPSV23) vaccine. One dose is recommended after age 73. Talk to your health care provider about which screenings and vaccines you need and how often you need them. This information is not intended to replace advice given to you by your health care provider. Make sure you discuss any questions you have with your health care provider. Document Released: 09/17/2015 Document Revised: 05/10/2016 Document Reviewed: 06/22/2015 Elsevier Interactive Patient Education  2017 [place] Prevention in the Home Falls can cause injuries. They can happen to people of all ages. There are many things you can do to make your home safe and to help prevent falls. What can I do on the outside of my home?  Regularly fix the edges of walkways and driveways and fix any cracks.  Remove anything that might make you trip as you walk through a door, such as a raised step or threshold.  Trim any bushes or trees on the path to your home.  Use bright outdoor lighting.  Clear any walking paths of anything that might make someone trip, such as rocks or tools.  Regularly check to see if handrails are loose or broken. Make sure that both sides of any steps have handrails.  Any raised decks and porches should have guardrails on the edges.  Have any leaves, snow, or ice cleared regularly.  Use sand or salt on walking paths during winter.  Clean up any spills in your garage right away. This includes oil or grease spills. What can I do in the bathroom?  Use night lights.  Install grab bars by the toilet and in the tub and shower. Do not use towel bars as grab bars.  Use  non-skid mats or decals in the tub or shower.  If you need to sit down in the shower, use a plastic, non-slip stool.  Keep the floor dry. Clean up any water that spills on the floor as soon as it happens.  Remove soap buildup in the tub or shower regularly.  Attach bath mats securely with double-sided non-slip rug tape.  Do not have throw rugs and other things on the floor that can make you trip. What can I do in the bedroom?  Use night lights.  Make sure that you have a light by your bed that is easy to reach.  Do not use any sheets or blankets that are too big for your bed. They should not hang down onto the floor.  Have a firm chair that has side arms. You can use this for support while you get dressed.  Do not have throw rugs and other things on the floor that can make you trip. What can I do in the kitchen?  Clean up any spills right away.  Avoid walking on wet floors.  Keep items that you use a lot in easy-to-reach places.  If you need to reach something above you, use a  strong step stool that has a grab bar.  Keep electrical cords out of the way.  Do not use floor polish or wax that makes floors slippery. If you must use wax, use non-skid floor wax.  Do not have throw rugs and other things on the floor that can make you trip. What can I do with my stairs?  Do not leave any items on the stairs.  Make sure that there are handrails on both sides of the stairs and use them. Fix handrails that are broken or loose. Make sure that handrails are as long as the stairways.  Check any carpeting to make sure that it is firmly attached to the stairs. Fix any carpet that is loose or worn.  Avoid having throw rugs at the top or bottom of the stairs. If you do have throw rugs, attach them to the floor with carpet tape.  Make sure that you have a light switch at the top of the stairs and the bottom of the stairs. If you do not have them, ask someone to add them for you. What  else can I do to help prevent falls?  Wear shoes that:  Do not have high heels.  Have rubber bottoms.  Are comfortable and fit you well.  Are closed at the toe. Do not wear sandals.  If you use a stepladder:  Make sure that it is fully opened. Do not climb a closed stepladder.  Make sure that both sides of the stepladder are locked into place.  Ask someone to hold it for you, if possible.  Clearly mark and make sure that you can see:  Any grab bars or handrails.  First and last steps.  Where the edge of each step is.  Use tools that help you move around (mobility aids) if they are needed. These include:  Canes.  Walkers.  Scooters.  Crutches.  Turn on the lights when you go into a dark area. Replace any light bulbs as soon as they burn out.  Set up your furniture so you have a clear path. Avoid moving your furniture around.  If any of your floors are uneven, fix them.  If there are any pets around you, be aware of where they are.  Review your medicines with your doctor. Some medicines can make you feel dizzy. This can increase your chance of falling. Ask your doctor what other things that you can do to help prevent falls. This information is not intended to replace advice given to you by your health care provider. Make sure you discuss any questions you have with your health care provider. Document Released: 06/17/2009 Document Revised: 01/27/2016 Document Reviewed: 09/25/2014 Elsevier Interactive Patient Education  2017 [employer].

## 2021-01-04 NOTE — Progress Notes (Signed)
Subjective:   David Schwartz is a 75 y.o. male who presents for an Initial Medicare Annual Wellness Visit.  Virtual Visit via Telephone Note  I connected with  David Schwartz on 01/04/21 at  2:45 PM EDT by telephone and verified that I am speaking with the correct person using two identifiers.  Location: Patient: Home Provider: [facility] Persons participating in the virtual visit: patient/Nurse Health Advisor   I discussed the limitations, risks, security and privacy concerns of performing an evaluation and management service by telephone and the availability of in person appointments. The patient expressed understanding and agreed to proceed.  Interactive audio and video telecommunications were attempted between this nurse and patient, however failed, due to patient having technical difficulties OR patient did not have access to video capability.  We continued and completed visit with audio only.  Some vital signs may be absent or patient reported.   Veona Bittman E Trysta Showman, LPN   Review of Systems     Cardiac Risk Factors include: advanced age (>12men, >29 women);dyslipidemia;family history of premature cardiovascular disease;hypertension;male gender     Objective:    Today's Vitals   01/04/21 1441  Weight: 172 lb (78 kg)  Height: 5\' 8"  (1.727 m)   Body mass index is 26.15 kg/m.  Advanced Directives 01/04/2021 01/29/2020 01/27/2020 01/02/2020  Does Patient Have a Medical Advance Directive? Yes Yes Yes Yes  Type of Paramedic of [place];Living will - Living will;Healthcare Power of Attorney Living will;Healthcare Power of Attorney  Does patient want to make changes to medical advance directive? - - - No - Patient declined  Copy of [place] in Chart? No - copy requested No - copy requested No - copy requested No - copy requested    Current Medications (verified) Outpatient Encounter Medications as of 01/04/2021  Medication Sig  .  alendronate (FOSAMAX) 70 MG tablet Take 1 tablet (70 mg total) by mouth once a week. Take with a full glass of water on an empty stomach.  . benzonatate (TESSALON) 100 MG capsule Take 1 capsule (100 mg total) by mouth 2 (two) times daily as needed for cough.  . calcium carbonate (OSCAL) 1500 (600 Ca) MG TABS tablet Take by mouth daily with breakfast.  . Cholecalciferol (VITAMIN D3) 1000 units CAPS Take by mouth.  Arne Cleveland 5 MG TABS tablet Take 1 tablet by mouth twice daily  . ketoconazole (NIZORAL) 2 % cream Apply 1 application topically daily.  Marland Kitchen loperamide (IMODIUM A-D) 2 MG tablet Take 1 tablet (2 mg total) by mouth 4 (four) times daily as needed for diarrhea or loose stools.  Marland Kitchen losartan (COZAAR) 25 MG tablet Take 1 tablet by mouth once daily  . metoprolol succinate (TOPROL-XL) 50 MG 24 hr tablet Take 1 tablet by mouth once daily  . rosuvastatin (CRESTOR) 5 MG tablet TAKE 1 TABLET BY MOUTH  DAILY AT 2 PM  . tadalafil (CIALIS) 20 MG tablet Take 1 tablet (20 mg total) by mouth as needed for erectile dysfunction.  Marland Kitchen terazosin (HYTRIN) 2 MG capsule TAKE 1 CAPSULE BY MOUTH AT  BEDTIME  . triamcinolone (KENALOG) 0.1 % Apply 1 application topically 2 (two) times daily.  . ondansetron (ZOFRAN) 4 MG tablet Take 1 tablet (4 mg total) by mouth every 8 (eight) hours as needed for nausea or vomiting. (Patient not taking: Reported on 01/04/2021)   No facility-administered encounter medications on file as of 01/04/2021.    Allergies (verified) Patient has no known  allergies.   History: Past Medical History:  Diagnosis Date  . Arthritis   . Hyperlipidemia   . Hypertension    Past Surgical History:  Procedure Laterality Date  . CARDIOVERSION N/A 01/29/2020   Procedure: CARDIOVERSION;  Surgeon: Satira Sark, MD;  Location: [facility];  Service: Cardiovascular;  Laterality: N/A;  . HERNIA REPAIR    . KNEE ARTHROSCOPY     Family History  Problem Relation Age of Onset  . Diabetes Mother   .  Hypertension Mother   . Early death Father   . Arthritis Maternal Grandmother   . Hypertension Maternal Grandfather   . Hypertension Paternal Grandmother   . Cancer Paternal Grandfather        lung   Social History   Socioeconomic History  . Marital status: Married    Spouse name: Katharine Look  . Number of children: Not on file  . Years of education: 50  . Highest education level: 12th grade  Occupational History  . Occupation: Retired  Tobacco Use  . Smoking status: Never Smoker  . Smokeless tobacco: Never Used  Vaping Use  . Vaping Use: Never used  Substance and Sexual Activity  . Alcohol use: Yes    Comment: occ  . Drug use: Never  . Sexual activity: Not on file  Other Topics Concern  . Not on file  Social History Narrative   Living with wife - son lives nearby   Social Determinants of Health   Financial Resource Strain: [place]   . Difficulty of Paying Living Expenses: Not hard at all  Food Insecurity: No Food Insecurity  . Worried About Charity fundraiser in the Last Year: Never true  . Ran Out of Food in the Last Year: Never true  Transportation Needs: No Transportation Needs  . Lack of Transportation (Medical): No  . Lack of Transportation (Non-Medical): No  Physical Activity: Insufficiently Active  . Days of Exercise per Week: 7 days  . Minutes of Exercise per Session: 20 min  Stress: No Stress Concern Present  . Feeling of Stress : Not at all  Social Connections: Socially Integrated  . Frequency of Communication with Friends and Family: More than three times a week  . Frequency of Social Gatherings with Friends and Family: More than three times a week  . Attends Religious Services: More than 4 times per year  . Active Member of Clubs or Organizations: Yes  . Attends Archivist Meetings: 1 to 4 times per year  . Marital Status: Married    Tobacco Counseling Counseling given: Not Answered   Clinical Intake:  Pre-visit preparation completed:  Yes  Pain : No/denies pain     BMI - recorded: 26.76 Nutritional Status: BMI 25 -29 Overweight Nutritional Risks: None (stomach bug 2 wks ago - resolved) Diabetes: No  How often do you need to have someone help you when you read instructions, pamphlets, or other written materials from your doctor or pharmacy?: 1 - Never  Diabetic? No  Interpreter Needed?: No  Information entered by :: Torrin Crihfield, LPN   Activities of Daily Living In your present state of health, do you have any difficulty performing the following activities: 01/04/2021 01/27/2020  Hearing? N N  Vision? N N  Difficulty concentrating or making decisions? N N  Walking or climbing stairs? N N  Dressing or bathing? N N  Doing errands, shopping? N N  Preparing Food and eating ? N -  Using the Toilet? N -  In the past six months, have you accidently leaked urine? N -  Do you have problems with loss of bowel control? N -  Managing your Medications? N -  Managing your Finances? N -  Housekeeping or managing your Housekeeping? N -  Some recent data might be hidden    Patient Care Team: Dettinger, Fransisca Kaufmann, MD as PCP - General (Family Medicine) Branch, Alphonse Guild, MD as PCP - Cardiology (Cardiology) Lavera Guise, [facility] (Pharmacist) Sandford Craze, MD as Referring Physician (Dermatology) Alyson Ingles Candee Furbish, MD as Consulting Physician (Urology) Harl Bowie Alphonse Guild, MD as Consulting Physician (Cardiology)  Indicate any recent Medical Services you may have received from other than [facility] providers in the past year (date may be approximate).     Assessment:   This is a routine wellness examination for David Schwartz.  Hearing/Vision screen  Hearing Screening   125Hz  250Hz  500Hz  1000Hz  2000Hz  3000Hz  4000Hz  6000Hz  8000Hz   Right ear:           Left ear:           Comments: Denies hearing difficulties  Vision Screening Comments: Wears eyeglasses - [facility] - up to date with eye exam  Dietary issues  and exercise activities discussed: Current Exercise Habits: Home exercise routine, Type of exercise: walking, Time (Minutes): 20, Frequency (Times/Week): 7, Weekly Exercise (Minutes/Week): 140, Intensity: Mild, Exercise limited by: orthopedic condition(s)  Goals Addressed            This Visit's Progress   . Exercise 3x per week (30 min per time)   On track    01/02/2020 AWV Goal: Exercise for General Health   Patient will verbalize understanding of the benefits of increased physical activity:  Exercising regularly is important. It will improve your overall fitness, flexibility, and endurance.  Regular exercise also will improve your overall health. It can help you control your weight, reduce stress, and improve your bone density.  Over the next year, patient will increase physical activity as tolerated with a goal of at least 150 minutes of moderate physical activity per week.   You can tell that you are exercising at a moderate intensity if your heart starts beating faster and you start breathing faster but can still hold a conversation.  Moderate-intensity exercise ideas include:  Walking 1 mile (1.6 km) in about 15 minutes  Biking  Hiking  Golfing  Dancing  Water aerobics  Patient will verbalize understanding of everyday activities that increase physical activity by providing examples like the following: ? Yard work, such as: ? Pushing a Conservation officer, nature ? Raking and bagging leaves ? Washing your car ? Pushing a stroller ? Shoveling snow ? Gardening ? Washing windows or floors  Patient will be able to explain general safety guidelines for exercising:   Before you start a new exercise program, talk with your health care provider.  Do not exercise so much that you hurt yourself, feel dizzy, or get very short of breath.  Wear comfortable clothes and wear shoes with good support.  Drink plenty of water while you exercise to prevent dehydration or heat stroke.  Work  out until your breathing and your heartbeat get faster.       Depression Screen PHQ 2/9 Scores 01/04/2021 04/08/2020 01/02/2020 12/25/2019 07/28/2019 07/15/2019 06/26/2019  PHQ - 2 Score 0 0 0 0 0 0 0  PHQ- 9 Score - - - - 0 - -    Fall Risk Fall Risk  01/04/2021 04/08/2020 01/02/2020  12/25/2019 07/15/2019  Falls in the past year? 0 1 0 0 0  Number falls in past yr: 0 0 0 - -  Injury with Fall? 0 1 0 - -  Comment - mid back sore, right shoulder sore - - -  Risk for fall due to : Orthopedic patient;Impaired vision Impaired balance/gait No Fall Risks - -  Follow up Falls prevention discussed;Education provided Falls evaluation completed Falls evaluation completed - -    FALL RISK PREVENTION PERTAINING TO THE HOME:  Any stairs in or around the home? Yes  If so, are there any without handrails? No  Home free of loose throw rugs in walkways, pet beds, electrical cords, etc? Yes  Adequate lighting in your home to reduce risk of falls? Yes   ASSISTIVE DEVICES UTILIZED TO PREVENT FALLS:  Life alert? No  Use of a cane, walker or w/c? No  Grab bars in the bathroom? No  Shower chair or bench in shower? No  Elevated toilet seat or a handicapped toilet? No   TIMED UP AND GO:  Was the test performed? No .  Telephonic visit.  Cognitive Function: Normal cognitive status assessed by direct observation by this Nurse Health Advisor. No abnormalities found.      6CIT Screen 01/02/2020  What Year? 0 points  What month? 0 points  What time? 0 points  Count back from 20 0 points  Months in reverse 0 points  Repeat phrase 4 points  Total Score 4    Immunizations Immunization History  Administered Date(s) Administered  . Fluad Quad(high Dose 65+) 06/06/2019  . Influenza, High Dose Seasonal PF 05/30/2018  . Influenza-Unspecified 07/27/2020  . Moderna Sars-Covid-2 Vaccination 10/18/2019, 11/15/2019  . Pneumococcal Conjugate-13 04/08/2020  . Pneumococcal Polysaccharide-23 08/10/2011  .  Pneumococcal-Unspecified 02/23/2014  . Tdap 03/31/2016  . Zoster 06/10/2011    TDAP status: Up to date  Flu Vaccine status: Up to date  Pneumococcal vaccine status: Up to date  Covid-19 vaccine status: Completed vaccines  Qualifies for Shingles Vaccine? Yes   Zostavax completed Yes   Shingrix Completed?: Yes  Screening Tests Health Maintenance  Topic Date Due  . DEXA SCAN  08/11/2019  . COVID-19 Vaccine (3 - Moderna risk 4-dose series) 04/18/2021 (Originally 12/13/2019)  . INFLUENZA VACCINE  04/04/2021  . COLONOSCOPY (Pts 45-1yrs Insurance coverage will need to be confirmed)  10/29/2022  . TETANUS/TDAP  03/31/2026  . Hepatitis C Screening  Completed  . PNA vac Low Risk Adult  Completed  . HPV VACCINES  Aged Out    Health Maintenance  Health Maintenance Due  Topic Date Due  . DEXA SCAN  08/11/2019    Colorectal cancer screening: Type of screening: Colonoscopy. Completed 10/29/2012. Repeat every 10 years  Lung Cancer Screening: (Low Dose CT Chest recommended if Age 81-80 years, 30 pack-year currently smoking OR have quit w/in 15years.) does not qualify.   Additional Screening:  Hepatitis C Screening: does qualify; Completed 06/26/2019  Vision Screening: Recommended annual ophthalmology exams for early detection of glaucoma and other disorders of the eye. Is the patient up to date with their annual eye exam?  Yes  Who is the provider or what is the name of the office in which the patient attends annual [facility] If pt is not established with a provider, would they like to be referred to a provider to establish care? No .   Dental Screening: Recommended annual dental exams for proper oral hygiene  Community Resource Referral /  Chronic Care Management: CRR required this visit?  No   CCM required this visit?  No      Plan:     I have personally reviewed and noted the following in the patient's chart:   . Medical and social history . Use of alcohol,  tobacco or illicit drugs  . Current medications and supplements including opioid prescriptions. Patient is not currently taking opioid prescriptions. . Functional ability and status . Nutritional status . Physical activity . Advanced directives . List of other physicians . Hospitalizations, surgeries, and ER visits in previous 12 months . Vitals . Screenings to include cognitive, depression, and falls . Referrals and appointments  In addition, I have reviewed and discussed with patient certain preventive protocols, quality metrics, and best practice recommendations. A written personalized care plan for preventive services as well as general preventive health recommendations were provided to patient.     Sandrea Hammond, LPN   6/0/6301   Nurse Notes: None

## 2021-01-12 ENCOUNTER — Other Ambulatory Visit: Payer: Self-pay | Admitting: Cardiology

## 2021-01-12 NOTE — Telephone Encounter (Signed)
Prescription refill request for Eliquis received. Indication:  Atrial Fib Last office visit: 11/26/20 Scr: 0.67 on 10/08/20 Age: 75 Weight: 79.7kg  Based on above findings Eliquis 5mg  twice daily is the appropriate dose.  Refill approved.

## 2021-02-16 ENCOUNTER — Other Ambulatory Visit: Payer: Medicare Other

## 2021-02-16 ENCOUNTER — Other Ambulatory Visit: Payer: Self-pay

## 2021-02-16 DIAGNOSIS — C61 Malignant neoplasm of prostate: Secondary | ICD-10-CM

## 2021-02-17 LAB — PSA: Prostate Specific Ag, Serum: 15.4 ng/mL — ABNORMAL HIGH (ref 0.0–4.0)

## 2021-02-23 ENCOUNTER — Encounter: Payer: Self-pay | Admitting: Urology

## 2021-02-23 ENCOUNTER — Other Ambulatory Visit: Payer: Self-pay

## 2021-02-23 ENCOUNTER — Ambulatory Visit (INDEPENDENT_AMBULATORY_CARE_PROVIDER_SITE_OTHER): Payer: Medicare Other | Admitting: Urology

## 2021-02-23 VITALS — BP 123/70 | HR 73

## 2021-02-23 DIAGNOSIS — N401 Enlarged prostate with lower urinary tract symptoms: Secondary | ICD-10-CM | POA: Diagnosis not present

## 2021-02-23 DIAGNOSIS — C61 Malignant neoplasm of prostate: Secondary | ICD-10-CM | POA: Diagnosis not present

## 2021-02-23 DIAGNOSIS — R3912 Poor urinary stream: Secondary | ICD-10-CM | POA: Diagnosis not present

## 2021-02-23 DIAGNOSIS — N5201 Erectile dysfunction due to arterial insufficiency: Secondary | ICD-10-CM

## 2021-02-23 DIAGNOSIS — N138 Other obstructive and reflux uropathy: Secondary | ICD-10-CM | POA: Diagnosis not present

## 2021-02-23 LAB — URINALYSIS, ROUTINE W REFLEX MICROSCOPIC
Bilirubin, UA: NEGATIVE
Glucose, UA: NEGATIVE
Ketones, UA: NEGATIVE
Leukocytes,UA: NEGATIVE
Nitrite, UA: NEGATIVE
Protein,UA: NEGATIVE
RBC, UA: NEGATIVE
Specific Gravity, UA: <1.005 — ABNORMAL LOW (ref 1.005–1.030)
Urobilinogen, Ur: 0.2 mg/dL (ref 0.2–1.0)
pH, UA: 6.5 (ref 5.0–7.5)

## 2021-02-23 MED ORDER — TERAZOSIN HCL 2 MG PO CAPS: 2.0000 mg | ORAL_CAPSULE | Freq: Every day | ORAL | 3 refills | Status: DC

## 2021-02-23 MED ORDER — TADALAFIL 20 MG PO TABS: 20.0000 mg | ORAL_TABLET | ORAL | 5 refills | Status: DC | PRN

## 2021-02-23 NOTE — Patient Instructions (Signed)
Benign Prostatic Hyperplasia  Benign prostatic hyperplasia (BPH) is an enlarged prostate gland that is caused by the normal aging process and not by cancer. The prostate is a walnut-sized gland that is involved in the production of semen. It is located in front of the rectum and below the bladder. The bladder stores urine and the urethra is the tube that carries the urine out of the body. The prostate may get bigger asa man gets older. An enlarged prostate can press on the urethra. This can make it harder to pass urine. The build-up of urine in the bladder can cause infection. Back pressure and infection may progress to bladder damage and kidney (renal) failure. What are the causes? This condition is part of a normal aging process. However, not all men develop problems from this condition. If the prostate enlarges away from the urethra, urine flow will not be blocked. If it enlarges toward the urethra andcompresses it, there will be problems passing urine. What increases the risk? This condition is more likely to develop in men over the age of 50 years. What are the signs or symptoms? Symptoms of this condition include: Getting up often during the night to urinate. Needing to urinate frequently during the day. Difficulty starting urine flow. Decrease in size and strength of your urine stream. Leaking (dribbling) after urinating. Inability to pass urine. This needs immediate treatment. Inability to completely empty your bladder. Pain when you pass urine. This is more common if there is also an infection. Urinary tract infection (UTI). How is this diagnosed? This condition is diagnosed based on your medical history, a physical exam, and your symptoms. Tests will also be done, such as: A post-void bladder scan. This measures any amount of urine that may remain in your bladder after you finish urinating. A digital rectal exam. In a rectal exam, your health care provider checks your prostate by  putting a lubricated, gloved finger into your rectum to feel the back of your prostate gland. This exam detects the size of your gland and any abnormal lumps or growths. An exam of your urine (urinalysis). A prostate specific antigen (PSA) screening. This is a blood test used to screen for prostate cancer. An ultrasound. This test uses sound waves to electronically produce a picture of your prostate gland. Your health care provider may refer you to a specialist in kidney and prostate diseases (urologist). How is this treated? Once symptoms begin, your health care provider will monitor your condition (active surveillance or watchful waiting). Treatment for this condition will depend on the severity of your condition. Treatment may include: Observation and yearly exams. This may be the only treatment needed if your condition and symptoms are mild. Medicines to relieve your symptoms, including: Medicines to shrink the prostate. Medicines to relax the muscle of the prostate. Surgery in severe cases. Surgery may include: Prostatectomy. In this procedure, the prostate tissue is removed completely through an open incision or with a laparoscope or robotics. Transurethral resection of the prostate (TURP). In this procedure, a tool is inserted through the opening at the tip of the penis (urethra). It is used to cut away tissue of the inner core of the prostate. The pieces are removed through the same opening of the penis. This removes the blockage. Transurethral incision (TUIP). In this procedure, small cuts are made in the prostate. This lessens the prostate's pressure on the urethra. Transurethral microwave thermotherapy (TUMT). This procedure uses microwaves to create heat. The heat destroys and removes a small   amount of prostate tissue. Transurethral needle ablation (TUNA). This procedure uses radio frequencies to destroy and remove a small amount of prostate tissue. Interstitial laser coagulation (ILC).  This procedure uses a laser to destroy and remove a small amount of prostate tissue. Transurethral electrovaporization (TUVP). This procedure uses electrodes to destroy and remove a small amount of prostate tissue. Prostatic urethral lift. This procedure inserts an implant to push the lobes of the prostate away from the urethra. Follow these instructions at home: Take over-the-counter and prescription medicines only as told by your health care provider. Monitor your symptoms for any changes. Contact your health care provider with any changes. Avoid drinking large amounts of liquid before going to bed or out in public. Avoid or reduce how much caffeine or alcohol you drink. Give yourself time when you urinate. Keep all follow-up visits as told by your health care provider. This is important. Contact a health care provider if: You have unexplained back pain. Your symptoms do not get better with treatment. You develop side effects from the medicine you are taking. Your urine becomes very dark or has a bad smell. Your lower abdomen becomes distended and you have trouble passing your urine. Get help right away if: You have a fever or chills. You suddenly cannot urinate. You feel lightheaded, or very dizzy, or you faint. There are large amounts of blood or clots in the urine. Your urinary problems become hard to manage. You develop moderate to severe low back or flank pain. The flank is the side of your body between the ribs and the hip. These symptoms may represent a serious problem that is an emergency. Do not wait to see if the symptoms will go away. Get medical help right away. Call your local emergency services (911 in the [place] Do not drive yourself to the hospital. Summary Benign prostatic hyperplasia (BPH) is an enlarged prostate that is caused by the normal aging process and not by cancer. An enlarged prostate can press on the urethra. This can make it hard to pass urine. This  condition is part of a normal aging process and is more likely to develop in men over the age of 50 years. Get help right away if you suddenly cannot urinate. This information is not intended to replace advice given to you by your health care provider. Make sure you discuss any questions you have with your healthcare provider. Document Revised: 04/29/2020 Document Reviewed: 04/29/2020 Elsevier Patient Education  2022 [employer] Inc.  

## 2021-02-23 NOTE — Progress Notes (Signed)
Urological Symptom Review  Patient is experiencing the following symptoms: Frequent urination Hard to postpone urination Get up at night to urinate Weak stream   Review of Systems  Gastrointestinal (upper)  : Negative for upper GI symptoms  Gastrointestinal (lower) : Negative for lower GI symptoms  Constitutional : Negative for symptoms  Skin: Negative for skin symptoms  Eyes: Negative for eye symptoms  Ear/Nose/Throat : Negative for Ear/Nose/Throat symptoms  Hematologic/Lymphatic: Negative for Hematologic/Lymphatic symptoms  Cardiovascular : Negative for cardiovascular symptoms  Respiratory : Negative for respiratory symptoms  Endocrine: Negative for endocrine symptoms  Musculoskeletal: Back pain Joint pain  Neurological: Negative for neurological symptoms  Psychologic: Negative for psychiatric symptoms

## 2021-02-23 NOTE — Progress Notes (Signed)
02/23/2021 11:13 AM   David Schwartz 1946/06/08 [id number]  Referring provider: Dettinger, Fransisca Kaufmann, MD [place],  [place] [address]  Followup prostate cancer and BPH   HPI: David Schwartz is a 75yo here for followup for prostate cancer and BPH. PSA increased to 15.4 from 9.8. No worsening LUTS. IPSS 5 QOL 2 on hytrin. He is now on eliquis for afib. No other complaints today. He has stable Ed for whioch he takes tadalafil   PMH: Past Medical History:  Diagnosis Date   Arthritis    Hyperlipidemia    Hypertension     Surgical History: Past Surgical History:  Procedure Laterality Date   CARDIOVERSION N/A 01/29/2020   Procedure: CARDIOVERSION;  Surgeon: Satira Sark, MD;  Location: [facility];  Service: Cardiovascular;  Laterality: N/A;   HERNIA REPAIR     KNEE ARTHROSCOPY      Home Medications:  Allergies as of 02/23/2021   No Known Allergies      Medication List        Accurate as of February 23, 2021 11:13 AM. If you have any questions, ask your nurse or doctor.          alendronate 70 MG tablet Commonly known as: FOSAMAX Take 1 tablet (70 mg total) by mouth once a week. Take with a full glass of water on an empty stomach.   benzonatate 100 MG capsule Commonly known as: TESSALON Take 1 capsule (100 mg total) by mouth 2 (two) times daily as needed for cough.   calcium carbonate 1500 (600 Ca) MG Tabs tablet Commonly known as: OSCAL Take by mouth daily with breakfast.   Eliquis 5 MG Tabs tablet Generic drug: apixaban Take 1 tablet by mouth twice daily   ketoconazole 2 % cream Commonly known as: NIZORAL Apply 1 application topically daily.   loperamide 2 MG tablet Commonly known as: Imodium A-D Take 1 tablet (2 mg total) by mouth 4 (four) times daily as needed for diarrhea or loose stools.   losartan 25 MG tablet Commonly known as: COZAAR Take 1 tablet by mouth once daily   metoprolol succinate 50 MG 24 hr tablet Commonly known as:  TOPROL-XL Take 1 tablet by mouth once daily   ondansetron 4 MG tablet Commonly known as: Zofran Take 1 tablet (4 mg total) by mouth every 8 (eight) hours as needed for nausea or vomiting.   rosuvastatin 5 MG tablet Commonly known as: CRESTOR TAKE 1 TABLET BY MOUTH  DAILY AT 2 PM   tadalafil 20 MG tablet Commonly known as: CIALIS Take 1 tablet (20 mg total) by mouth as needed for erectile dysfunction.   terazosin 2 MG capsule Commonly known as: HYTRIN TAKE 1 CAPSULE BY MOUTH AT  BEDTIME   triamcinolone cream 0.1 % Commonly known as: KENALOG Apply 1 application topically 2 (two) times daily.   Vitamin D3 25 MCG (1000 UT) Caps Take by mouth.        Allergies: No Known Allergies  Family History: Family History  Problem Relation Age of Onset   Diabetes Mother    Hypertension Mother    Early death Father    Arthritis Maternal Grandmother    Hypertension Maternal Grandfather    Hypertension Paternal Grandmother    Cancer Paternal Grandfather        lung    Social History:  reports that he has never smoked. He has never used smokeless tobacco. He reports current alcohol use. He reports that he  does not use drugs.  ROS: All other review of systems were reviewed and are negative except what is noted above in HPI  Physical Exam: BP 123/70   Pulse 73   Constitutional:  Alert and oriented, No acute distress. HEENT: [place] AT, moist mucus membranes.  Trachea midline, no masses. Cardiovascular: No clubbing, cyanosis, or edema. Respiratory: Normal respiratory effort, no increased work of breathing. GI: Abdomen is soft, nontender, nondistended, no abdominal masses GU: No CVA tenderness.  Lymph: No cervical or inguinal lymphadenopathy. Skin: No rashes, bruises or suspicious lesions. Neurologic: Grossly intact, no focal deficits, moving all 4 extremities. Psychiatric: Normal mood and affect.  Laboratory Data: Lab Results  Component Value Date   WBC 6.4 10/08/2020   HGB  14.5 10/08/2020   HCT 41.6 10/08/2020   MCV 92 10/08/2020   PLT 190 10/08/2020    Lab Results  Component Value Date   CREATININE 0.67 (L) 10/08/2020    No results found for: PSA  No results found for: TESTOSTERONE  No results found for: HGBA1C  Urinalysis    Component Value Date/Time   APPEARANCEUR Clear 08/25/2020 1430   GLUCOSEU Negative 08/25/2020 1430   BILIRUBINUR Negative 08/25/2020 1430   PROTEINUR Negative 08/25/2020 1430   NITRITE Negative 08/25/2020 1430   LEUKOCYTESUR Negative 08/25/2020 1430    Lab Results  Component Value Date   LABMICR Comment 08/25/2020    Pertinent Imaging:  No results found for this or any previous visit.  No results found for this or any previous visit.  No results found for this or any previous visit.  No results found for this or any previous visit.  No results found for this or any previous visit.  No results found for this or any previous visit.  No results found for this or any previous visit.  No results found for this or any previous visit.   Assessment & Plan:    1. Prostate cancer ([place]) -PSa todasy, if it remains elevated we will proceed with prostate biopsy - Urinalysis, Routine w reflex microscopic  2. Benign prostatic hyperplasia with urinary obstruction -Continue hytrin 2mg  daily  3. Weak urinary stream Continue hytrin 2mg  daily  4. Erectile dysfunction: -continue tadalafil 20mg  prn   No follow-ups on file.  Nicolette Bang, MD  [facility] [place]

## 2021-02-24 LAB — PSA: Prostate Specific Ag, Serum: 16.8 ng/mL — ABNORMAL HIGH (ref 0.0–4.0)

## 2021-03-03 ENCOUNTER — Telehealth: Payer: Self-pay | Admitting: Cardiology

## 2021-03-03 ENCOUNTER — Telehealth: Payer: Self-pay

## 2021-03-03 DIAGNOSIS — C61 Malignant neoplasm of prostate: Secondary | ICD-10-CM

## 2021-03-03 MED ORDER — LEVOFLOXACIN 750 MG PO TABS: 750.0000 mg | ORAL_TABLET | Freq: Once | ORAL | 0 refills | Status: AC

## 2021-03-03 NOTE — Telephone Encounter (Signed)
Patient called and made aware. Orders placed. Patient is not sure he will be able to keep the biopsy date but will call in advance if he needs to cancel.

## 2021-03-03 NOTE — Telephone Encounter (Signed)
-----   Message from Cleon Gustin, MD sent at 03/01/2021 10:38 AM EDT ----- PSA continues to increase. He needs to be scheduled for a prostate biopsy ----- Message ----- From: Iris Pert, LPN Sent: 05/24/1659   9:12 AM EDT To: Cleon Gustin, MD  Please review

## 2021-03-03 NOTE — Telephone Encounter (Signed)
Patient with diagnosis of atrial fibrillation on Eliquis for anticoagulation.    Procedure: prostate biopsy Date of procedure: 03/30/21   CHA2DS2-VASc Score = 3  This indicates a 3.2% annual risk of stroke. The patient's score is based upon: CHF History: No HTN History: Yes Diabetes History: No Stroke History: No Vascular Disease History: No Age Score: 2 Gender Score: 0    CrCl 105.1 Platelet count 190  Per office protocol, patient can hold Eliquis for 2 days prior to procedure.   Patient will not need bridging with Lovenox (enoxaparin) around procedure.

## 2021-03-03 NOTE — Telephone Encounter (Signed)
   [facility] Pre-operative Risk Assessment    Patient Name: David Schwartz  DOB: December 16, 1945  MRN: [id number]   HEARTCARE STAFF: - Please ensure there is not already an duplicate clearance open for this procedure. - Under Visit Info/Reason for Call, type in Other and utilize the format Clearance MM/DD/YY or Clearance TBD. Do not use dashes or single digits. - If request is for dental extraction, please clarify the # of teeth to be extracted. - If the patient is currently at the dentist's office, call Pre-Op APP to address. If the patient is not currently in the dentist office, please route to the Pre-Op pool  Request for surgical clearance:  What type of surgery is being performed? Prostate biopsy   When is this surgery scheduled? 03/30/21  What type of clearance is required (medical clearance vs. Pharmacy clearance to hold med vs. Both)? both  Are there any medications that need to be held prior to surgery and how long?Eliquis for 2 days prior   Practice name and name of physician performing surgery? Dr Nicolette Bang   What is the office phone number? [phone]    7.   What is the office fax number? [phone]  8.   Anesthesia type (None, local, MAC, general) ? Local    Jannet Askew 03/03/2021, 12:48 PM  _________________________________________________________________   (provider comments below)

## 2021-03-04 NOTE — Telephone Encounter (Signed)
   Primary Cardiologist: Carlyle Dolly, MD  Chart reviewed as part of pre-operative protocol coverage. Given past medical history and time since last visit, based on ACC/AHA guidelines, David Schwartz would be at acceptable risk for the planned procedure without further cardiovascular testing.   Patient with diagnosis of atrial fibrillation on Eliquis for anticoagulation.     Procedure: prostate biopsy Date of procedure: 03/30/21     CHA2DS2-VASc Score = 3  This indicates a 3.2% annual risk of stroke. The patient's score is based upon: CHF History: No HTN History: Yes Diabetes History: No Stroke History: No Vascular Disease History: No Age Score: 2 Gender Score: 0      CrCl 105.1 Platelet count 190   Per office protocol, patient can hold Eliquis for 2 days prior to procedure.   Patient will not need bridging with Lovenox (enoxaparin) around procedure.  Patient was advised that if he develops new symptoms prior to surgery to contact our office to arrange a follow-up appointment.  He verbalized understanding.  I will route this recommendation to the requesting party via Epic fax function and remove from pre-op pool.  Please call with questions.  Jossie Ng. Chrys Landgrebe NP-C    03/04/2021, 8:10 AM [address] Office [phone] Fax [phone]

## 2021-03-22 ENCOUNTER — Telehealth: Payer: Self-pay | Admitting: Urology

## 2021-03-22 NOTE — Telephone Encounter (Signed)
Pt wants to reschedule his biopsy appointment that he has next week. He wants to reschedule it for next month because his wife just had surgery.

## 2021-03-23 NOTE — Telephone Encounter (Signed)
Rescheduled with patient.  Faxed to centralized scheduling.  New instructions mailed.

## 2021-03-23 NOTE — Telephone Encounter (Signed)
Called patient. No answer. Biopsy cancelled in system for now.

## 2021-03-30 ENCOUNTER — Other Ambulatory Visit: Payer: Medicare Other | Admitting: Urology

## 2021-03-30 ENCOUNTER — Encounter (HOSPITAL_COMMUNITY): Payer: Self-pay

## 2021-03-30 ENCOUNTER — Ambulatory Visit (HOSPITAL_COMMUNITY): Payer: Medicare Other

## 2021-04-06 ENCOUNTER — Ambulatory Visit: Payer: Medicare Other | Admitting: Urology

## 2021-04-07 ENCOUNTER — Encounter: Payer: Self-pay | Admitting: Family Medicine

## 2021-04-07 ENCOUNTER — Ambulatory Visit (INDEPENDENT_AMBULATORY_CARE_PROVIDER_SITE_OTHER): Payer: Medicare Other

## 2021-04-07 ENCOUNTER — Other Ambulatory Visit: Payer: Self-pay

## 2021-04-07 ENCOUNTER — Ambulatory Visit (INDEPENDENT_AMBULATORY_CARE_PROVIDER_SITE_OTHER): Payer: Medicare Other | Admitting: Family Medicine

## 2021-04-07 VITALS — BP 117/68 | HR 54 | Ht 68.0 in | Wt 170.0 lb

## 2021-04-07 DIAGNOSIS — E782 Mixed hyperlipidemia: Secondary | ICD-10-CM | POA: Diagnosis not present

## 2021-04-07 DIAGNOSIS — M818 Other osteoporosis without current pathological fracture: Secondary | ICD-10-CM

## 2021-04-07 DIAGNOSIS — I4819 Other persistent atrial fibrillation: Secondary | ICD-10-CM

## 2021-04-07 DIAGNOSIS — M810 Age-related osteoporosis without current pathological fracture: Secondary | ICD-10-CM

## 2021-04-07 DIAGNOSIS — I10 Essential (primary) hypertension: Secondary | ICD-10-CM | POA: Diagnosis not present

## 2021-04-07 NOTE — Progress Notes (Signed)
BP 117/68   Pulse (!) 54   Ht '5\' 8"'  (1.727 m)   Wt 170 lb (77.1 kg)   SpO2 96%   BMI 25.85 kg/m    Subjective:   Patient ID: David Schwartz, male    DOB: 07-31-1946, 75 y.o.   MRN: [id number]  HPI: David Schwartz is a 75 y.o. male presenting on 04/07/2021 for Medical Management of Chronic Issues, Hyperlipidemia, and Hypertension   HPI Hypertension Patient is currently on losartan and metoprolol, and their blood pressure today is 117/68. Patient denies any lightheadedness or dizziness. Patient denies headaches, blurred vision, chest pains, shortness of breath, or weakness. Denies any side effects from medication and is content with current medication.   Hyperlipidemia Patient is coming in for recheck of his hyperlipidemia. The patient is currently taking crestor. They deny any issues with myalgias or history of liver damage from it. They deny any focal numbness or weakness or chest pain.   Osteoporosis/osteopenia Fractures or history of fracture: None Medication: Calcium and vitamin D Duration of treatment: 5 years Last bone density scan: -1.9 Last T score:    A. Fib Sees cardiology for A. fib and is on Eliquis and is doing well.  He denies any chest pain or palpitations or issues.  He also sees the [place].  Relevant past medical, surgical, family and social history reviewed and updated as indicated. Interim medical history since our last visit reviewed. Allergies and medications reviewed and updated.  Review of Systems  Constitutional:  Negative for chills and fever.  Eyes:  Negative for visual disturbance.  Respiratory:  Negative for shortness of breath and wheezing.   Cardiovascular:  Negative for chest pain and leg swelling.  Musculoskeletal:  Negative for back pain and gait problem.  Skin:  Negative for rash.  Neurological:  Negative for dizziness, weakness and light-headedness.  All other systems reviewed and are negative.  Per HPI unless specifically indicated  above   Allergies as of 04/07/2021   No Known Allergies      Medication List        Accurate as of April 07, 2021 10:07 AM. If you have any questions, ask your nurse or doctor.          alendronate 70 MG tablet Commonly known as: FOSAMAX Take 1 tablet (70 mg total) by mouth once a week. Take with a full glass of water on an empty stomach.   benzonatate 100 MG capsule Commonly known as: TESSALON Take 1 capsule (100 mg total) by mouth 2 (two) times daily as needed for cough.   calcium carbonate 1500 (600 Ca) MG Tabs tablet Commonly known as: OSCAL Take by mouth daily with breakfast.   Eliquis 5 MG Tabs tablet Generic drug: apixaban Take 1 tablet by mouth twice daily   ketoconazole 2 % cream Commonly known as: NIZORAL Apply 1 application topically daily.   loperamide 2 MG tablet Commonly known as: Imodium A-D Take 1 tablet (2 mg total) by mouth 4 (four) times daily as needed for diarrhea or loose stools.   losartan 25 MG tablet Commonly known as: COZAAR Take 1 tablet by mouth once daily   metoprolol succinate 50 MG 24 hr tablet Commonly known as: TOPROL-XL Take 1 tablet by mouth once daily   ondansetron 4 MG tablet Commonly known as: Zofran Take 1 tablet (4 mg total) by mouth every 8 (eight) hours as needed for nausea or vomiting.   rosuvastatin 5 MG tablet Commonly known as: CRESTOR  TAKE 1 TABLET BY MOUTH  DAILY AT 2 PM   tadalafil 20 MG tablet Commonly known as: CIALIS Take 1 tablet (20 mg total) by mouth as needed for erectile dysfunction.   terazosin 2 MG capsule Commonly known as: HYTRIN Take 1 capsule (2 mg total) by mouth at bedtime.   triamcinolone cream 0.1 % Commonly known as: KENALOG Apply 1 application topically 2 (two) times daily.   Vitamin D3 25 MCG (1000 UT) Caps Take by mouth.         Objective:   BP 117/68   Pulse (!) 54   Ht '5\' 8"'  (1.727 m)   Wt 170 lb (77.1 kg)   SpO2 96%   BMI 25.85 kg/m   Wt Readings from Last 3  Encounters:  04/07/21 170 lb (77.1 kg)  01/04/21 172 lb (78 kg)  11/26/20 175 lb 12.8 oz (79.7 kg)    Physical Exam Vitals and nursing note reviewed.  Constitutional:      General: He is not in acute distress.    Appearance: He is well-developed. He is not diaphoretic.  Eyes:     General: No scleral icterus.    Conjunctiva/sclera: Conjunctivae normal.  Neck:     Thyroid: No thyromegaly.  Cardiovascular:     Rate and Rhythm: Normal rate and regular rhythm.     Heart sounds: Normal heart sounds. No murmur heard. Pulmonary:     Effort: Pulmonary effort is normal. No respiratory distress.     Breath sounds: Normal breath sounds. No wheezing.  Musculoskeletal:        General: Normal range of motion.     Cervical back: Neck supple.  Lymphadenopathy:     Cervical: No cervical adenopathy.  Skin:    General: Skin is warm and dry.     Findings: No rash.  Neurological:     Mental Status: He is alert and oriented to person, place, and time.     Coordination: Coordination normal.  Psychiatric:        Behavior: Behavior normal.      Assessment & Plan:   Problem List Items Addressed This Visit       Cardiovascular and Mediastinum   Hypertension - Primary   Relevant Orders   CMP14+EGFR   CBC with Differential/Platelet   Persistent atrial fibrillation (HCC)   Relevant Orders   CBC with Differential/Platelet     Musculoskeletal and Integument   Osteoporosis   Relevant Orders   DG [facility] DEXA     Other   Hyperlipemia   Relevant Orders   Lipid panel    Patient is due for bone density and will do blood work, continue current medication.  No changes blood pressure looks good Follow up plan: Return in about 6 months (around 10/08/2021), or if symptoms worsen or fail to improve, for Physical exam and hypertension and A. fib.  Counseling provided for all of the vaccine components Orders Placed This Encounter  Procedures   DG [facility] DEXA   CMP14+EGFR   CBC with  Differential/Platelet   Lipid panel    Caryl Pina, MD [facility] 04/07/2021, 10:07 AM

## 2021-04-08 DIAGNOSIS — Z780 Asymptomatic menopausal state: Secondary | ICD-10-CM | POA: Diagnosis not present

## 2021-04-08 DIAGNOSIS — M85832 Other specified disorders of bone density and structure, left forearm: Secondary | ICD-10-CM | POA: Diagnosis not present

## 2021-04-08 DIAGNOSIS — M85852 Other specified disorders of bone density and structure, left thigh: Secondary | ICD-10-CM | POA: Diagnosis not present

## 2021-04-08 LAB — CBC WITH DIFFERENTIAL/PLATELET
Basophils Absolute: 0.1 10*3/uL (ref 0.0–0.2)
Basos: 1 %
EOS (ABSOLUTE): 0.3 10*3/uL (ref 0.0–0.4)
Eos: 5 %
Hematocrit: 40.6 % (ref 37.5–51.0)
Hemoglobin: 14.6 g/dL (ref 13.0–17.7)
Immature Grans (Abs): 0 10*3/uL (ref 0.0–0.1)
Immature Granulocytes: 0 %
Lymphocytes Absolute: 2.1 10*3/uL (ref 0.7–3.1)
Lymphs: 31 %
MCH: 32.7 pg (ref 26.6–33.0)
MCHC: 36 g/dL — ABNORMAL HIGH (ref 31.5–35.7)
MCV: 91 fL (ref 79–97)
Monocytes Absolute: 0.6 10*3/uL (ref 0.1–0.9)
Monocytes: 9 %
Neutrophils Absolute: 3.5 10*3/uL (ref 1.4–7.0)
Neutrophils: 54 %
Platelets: 190 10*3/uL (ref 150–450)
RBC: 4.46 x10E6/uL (ref 4.14–5.80)
RDW: 12.1 % (ref 11.6–15.4)
WBC: 6.6 10*3/uL (ref 3.4–10.8)

## 2021-04-08 LAB — CMP14+EGFR
ALT: 16 IU/L (ref 0–44)
AST: 20 IU/L (ref 0–40)
Albumin/Globulin Ratio: 2.1 (ref 1.2–2.2)
Albumin: 4.6 g/dL (ref 3.7–4.7)
Alkaline Phosphatase: 52 IU/L (ref 44–121)
BUN/Creatinine Ratio: 24 (ref 10–24)
BUN: 18 mg/dL (ref 8–27)
Bilirubin Total: 0.6 mg/dL (ref 0.0–1.2)
CO2: 24 mmol/L (ref 20–29)
Calcium: 9.1 mg/dL (ref 8.6–10.2)
Chloride: 102 mmol/L (ref 96–106)
Creatinine, Ser: 0.76 mg/dL (ref 0.76–1.27)
Globulin, Total: 2.2 g/dL (ref 1.5–4.5)
Glucose: 90 mg/dL (ref 65–99)
Potassium: 4.6 mmol/L (ref 3.5–5.2)
Sodium: 140 mmol/L (ref 134–144)
Total Protein: 6.8 g/dL (ref 6.0–8.5)
eGFR: 94 mL/min/{1.73_m2} (ref >59)

## 2021-04-08 LAB — LIPID PANEL
Chol/HDL Ratio: 3.9 ratio (ref 0.0–5.0)
Cholesterol, Total: 170 mg/dL (ref 100–199)
HDL: 44 mg/dL (ref >39)
LDL Chol Calc (NIH): 103 mg/dL — ABNORMAL HIGH (ref 0–99)
Triglycerides: 130 mg/dL (ref 0–149)
VLDL Cholesterol Cal: 23 mg/dL (ref 5–40)

## 2021-04-13 ENCOUNTER — Telehealth: Payer: Self-pay

## 2021-04-13 NOTE — Telephone Encounter (Signed)
Patient calling to find out results of dexa scan.  Please review and advise.

## 2021-05-04 ENCOUNTER — Other Ambulatory Visit: Payer: Self-pay

## 2021-05-04 ENCOUNTER — Encounter: Payer: Self-pay | Admitting: Urology

## 2021-05-04 ENCOUNTER — Ambulatory Visit (HOSPITAL_COMMUNITY)
Admission: RE | Admit: 2021-05-04 | Discharge: 2021-05-04 | Disposition: A | Discharge status: Home / Self Care | Payer: Medicare Other | Source: Ambulatory Visit | Attending: Urology | Admitting: Urology

## 2021-05-04 ENCOUNTER — Encounter (HOSPITAL_COMMUNITY): Payer: Self-pay

## 2021-05-04 ENCOUNTER — Other Ambulatory Visit: Payer: Self-pay | Admitting: Urology

## 2021-05-04 ENCOUNTER — Ambulatory Visit (INDEPENDENT_AMBULATORY_CARE_PROVIDER_SITE_OTHER): Payer: Medicare Other | Admitting: Urology

## 2021-05-04 DIAGNOSIS — C61 Malignant neoplasm of prostate: Secondary | ICD-10-CM | POA: Insufficient documentation

## 2021-05-04 MED ORDER — LIDOCAINE HCL (PF) 2 % IJ SOLN: 10.0000 mL | Freq: Once | INTRAMUSCULAR | Status: AC

## 2021-05-04 MED ORDER — LIDOCAINE HCL (PF) 2 % IJ SOLN
INTRAMUSCULAR | Status: AC
  Filled 2021-05-04: qty 10

## 2021-05-04 MED ORDER — LIDOCAINE HCL (PF) 2 % IJ SOLN
INTRAMUSCULAR | Status: AC
  Administered 2021-05-04: 10 mL
  Filled 2021-05-04: qty 10

## 2021-05-04 MED ORDER — GENTAMICIN SULFATE 40 MG/ML IJ SOLN
INTRAMUSCULAR | Status: AC
  Administered 2021-05-04: 80 mg via INTRAMUSCULAR
  Filled 2021-05-04: qty 2

## 2021-05-04 MED ORDER — GENTAMICIN SULFATE 40 MG/ML IJ SOLN: 80.0000 mg | Freq: Once | INTRAMUSCULAR | Status: AC

## 2021-05-04 NOTE — Patient Instructions (Signed)
Transrectal Ultrasound-Guided Prostate Biopsy, Care After This sheet gives you information about how to care for yourself after your procedure. Your doctor may also give you more specific instructions. If youhave problems or questions, contact your doctor. What can I expect after the procedure? After the procedure, it is common to have: Pain and discomfort in your butt, especially while sitting. Pink-colored pee (urine), due to small amounts of blood in the pee. Burning while peeing (urinating). Blood in your poop (stool). Bleeding from your butt. Blood in your semen. Follow these instructions at home: Medicines Take over-the-counter and prescription medicines only as told by your doctor. If you were prescribed antibiotic medicine, take it as told by your doctor. Do not stop taking the antibiotic even if you start to feel better. Activity  Do not drive for 24 hours if you were given a medicine to help you relax (sedative) during your procedure. Return to your normal activities as told by your doctor. Ask your doctor what activities are safe for you. Ask your doctor when it is okay for you to have sex. Do not lift anything that is heavier than 10 lb (4.5 kg), or the limit that you are told, until your doctor says that it is safe.  General instructions  Drink enough water to keep your pee pale yellow. Watch your pee, poop, and semen for new bleeding or bleeding that gets worse. Keep all follow-up visits as told by your doctor. This is important.  Contact a doctor if you: Have blood clots in your pee or poop. Notice that your pee smells bad or unusual. Have very bad belly pain. Have trouble peeing. Notice that your lower belly feels firm. Have blood in your pee for more than 2 weeks after the procedure. Have blood in your semen for more than 2 months after the procedure. Have problems getting an erection. Feel sick to your stomach (nauseous) or throw up (vomit). Have new or worse  bleeding in your pee, poop, or semen. Get help right away if you: Have a fever or chills. Have bright red pee. Have very bad pain that does not get better with medicine. Cannot pee. Summary After this procedure, it is common to have pain and discomfort around your butt, especially while sitting. You may have blood in your pee and poop. It is common to have blood in your semen for 1-2 months. If you were prescribed antibiotic medicine, take it as told by your doctor. Do not stop taking the antibiotic even if you start to feel better. Get help right away if you have a fever or chills. This information is not intended to replace advice given to you by your health care provider. Make sure you discuss any questions you have with your healthcare provider. Document Revised: 07/05/2020 Document Reviewed: 05/06/2020 Elsevier Patient Education  2022 [employer] Inc[employer]  

## 2021-05-04 NOTE — Progress Notes (Signed)
Prostate Biopsy Procedure   Informed consent was obtained after discussing risks/benefits of the procedure.  A time out was performed to ensure correct patient identity.  Pre-Procedure: - Last PSA Level: No results found for: "PSA" - Gentamicin given prophylactically - Levaquin 500 mg administered PO -Transrectal Ultrasound performed revealing a 42.3 gm prostate -No significant hypoechoic or median lobe noted  Procedure: - Prostate block performed using 10 cc 1% lidocaine and biopsies taken from sextant areas, a total of 12 under ultrasound guidance.  Post-Procedure: - Patient tolerated the procedure well - He was counseled to seek immediate medical attention if experiences any severe pain, significant bleeding, or fevers - Return in one week to discuss biopsy results  

## 2021-05-04 NOTE — Sedation Documentation (Signed)
PT tolerated prostate biopsy procedure and antibiotic injection well today. Labs obtained and sent for pathology. PT ambulatory at discharge with no acute distress noted and verbalized understanding of discharge instructions. PT to follow up with urologist as scheduled on 05/11/21 at 3:45pm.

## 2021-05-05 ENCOUNTER — Other Ambulatory Visit: Payer: Self-pay | Admitting: Cardiology

## 2021-05-11 ENCOUNTER — Ambulatory Visit: Payer: Medicare Other | Admitting: Urology

## 2021-05-11 ENCOUNTER — Other Ambulatory Visit: Payer: Self-pay

## 2021-05-11 ENCOUNTER — Encounter: Payer: Self-pay | Admitting: Urology

## 2021-05-11 VITALS — BP 128/69 | HR 77

## 2021-05-11 DIAGNOSIS — C61 Malignant neoplasm of prostate: Secondary | ICD-10-CM | POA: Diagnosis not present

## 2021-05-11 NOTE — Patient Instructions (Signed)
Prostate Cancer The prostate is a small gland that produces fluid that makes up semen (seminal fluid). It is located below the bladder in men, in front of the rectum. Prostate cancer is the abnormal growth of cells in the prostate gland. What are the causes? The exact cause of this condition is not known. What increases the risk? You are more likely to develop this condition if: You are 75 years of age or older. You have a family history of prostate cancer. You have a family history of breast and ovarian cancer. You have genes that are passed from parent to child (inherited), such as BRCA1 and BRCA2. You have Lynch syndrome. African American men and men of African descent are diagnosed with prostate cancer at higher rates than other men. The reasons for this are not well understood and are likely due to a combination of genetic and environmental factors. What are the signs or symptoms? Symptoms of this condition include: Problems with urination. This may include: A weak or interrupted flow of urine. Trouble starting or stopping urination. Trouble emptying the bladder all the way. The need to urinate more often, especially at night. Blood in urine or semen. Persistent pain or discomfort in the lower back, lower abdomen, or hips. Trouble getting an erection. Weakness or numbness in the legs or feet. How is this diagnosed? This condition can be diagnosed with: A digital rectal exam. For this exam, a health care provider inserts a gloved finger into the rectum to feel the prostate gland. A blood test called a prostate-specific antigen (PSA) test. A procedure in which a sample of tissue is taken from the prostate and checked under a microscope (prostate biopsy). An imaging test called transrectal ultrasonography. Once the condition is diagnosed, tests will be done to determine how far the cancer has spread. This is called staging the cancer. Staging may involve imaging tests, such as a bone  scan, CT scan, PET scan, or MRI. Stages of prostate cancer The stages of prostate cancer are as follows: Stage 1 (I). At this stage, the cancer is found in the prostate only. The cancer is not visible on imaging tests, and it is usually found by accident, such as during prostate surgery. Stage 2 (II). At this stage, the cancer is more advanced than it is in stage 1, but the cancer has not spread outside the prostate. Stage 3 (III). At this stage, the cancer has spread beyond the outer layer of the prostate to nearby tissues. The cancer may be found in the seminal vesicles, which are near the bladder and the prostate. Stage 4 (IV). At this stage, the cancer has spread to other parts of the body, such as the lymph nodes, bones, bladder, rectum, liver, or lungs. Prostate cancer grading Prostate cancer is also graded according to how the cancer cells look under a microscope. This is called the Gleason score and the total score can range from 6-10, indicating how likely it is that the cancer will spread (metastasize) to other parts of the body. The higher the score, the greater the likelihood that the cancer will spread. Gleason 6 or lower: This indicates that the cancer cells look similar to normal prostate cells (well differentiated). Gleason 7: This indicates that the cancer cells look somewhat similar to normal prostate cells (moderately differentiated). Gleason 8, 9, or 10: This indicates that the cancer cells look very different than normal prostate cells (poorly differentiated). How is this treated? Treatment for this condition depends on several factors,  including the stage of the cancer, your age, personal preferences, and your overall health. Talk with your health care provider about treatment options that are recommended for you. Common treatments include: Observation for early stage prostate cancer (active surveillance). This involves having exams, blood tests, and in some cases, more biopsies.  For some men, this is the only treatment needed. Surgery. Types of surgeries include: Open surgery (radical prostatectomy). In this surgery, a larger incision is made to remove the prostate. A laparoscopic radical prostatectomy. This is a surgery to remove the prostate and lymph nodes through several small incisions. It is often referred to as a minimally invasive surgery. A robotic radical prostatectomy. This is laparoscopic surgery to remove the prostate and lymph nodes with the help of robotic arms that are controlled by the surgeon. Cryoablation. This is surgery to freeze and destroy cancer cells. Radiation treatment. Types of radiation treatment include: External beam radiation. This type aims beams of radiation from outside the body at the prostate to destroy cancerous cells. Brachytherapy. This type uses radioactive needles, seeds, wires, or tubes that are implanted into the prostate gland. Like external beam radiation, brachytherapy destroys cancerous cells. An advantage is that this type of radiation limits the damage to surrounding tissue and has fewer side effects. Chemotherapy. This treatment kills cancer cells or stops them from multiplying. It kills both cancer cells and normal cells. Targeted therapy. This treatment uses medicines to kill cancer cells without damaging normal cells. Hormone treatment. This treatment involves taking medicines that act on testosterone, one of the male hormones, by: Stopping your body from producing testosterone. Blocking testosterone from reaching cancer cells. Follow these instructions at home: Lifestyle Do not use any products that contain nicotine or tobacco. These products include cigarettes, chewing tobacco, and vaping devices, such as e-cigarettes. If you need help quitting, ask your health care provider. Eat a healthy diet. To do this: Eat foods that are high in fiber. These include beans, whole grains, and fresh fruits and vegetables. Limit  foods that are high in fat and sugar. These include fried or sweet foods. Treatment for prostate cancer may affect sexual function. If you have a partner, continue to have intimate moments. This may include touching, holding, hugging, and caressing your partner. Get plenty of sleep. Consider joining a support group for men who have prostate cancer. Meeting with a support group may help you learn to manage the stress of having cancer. General instructions Take over-the-counter and prescription medicines only as told by your health care provider. If you have to go to the hospital, notify your cancer specialist (oncologist). Keep all follow-up visits. This is important. Where to find more information American Cancer Society: www.cancer.org American Society of Clinical Oncology: www.cancer.net National Cancer Institute: www.cancer.gov Contact a health care provider if: You have new or increasing trouble urinating. You have new or increasing blood in your urine. You have new or increasing pain in your hips, back, or chest. Get help right away if: You have weakness or numbness in your legs. You cannot control urination or your bowel movements (incontinence). You have chills or a fever. Summary The prostate is a small gland that is involved in the production of semen. It is located below a man's bladder, in front of the rectum. Prostate cancer is the abnormal growth of cells in the prostate gland. Treatment for this condition depends on the stage of the cancer, your age, personal preferences, and your overall health. Talk with your health care provider about treatment   options that are recommended for you. Consider joining a support group for men who have prostate cancer. Meeting with a support group may help you learn to manage the stress of having cancer. This information is not intended to replace advice given to you by your health care provider. Make sure you discuss any questions you have with  your health care provider. Document Revised: 11/17/2020 Document Reviewed: 11/17/2020 Elsevier Patient Education  2022 Elsevier Inc.  

## 2021-05-11 NOTE — Addendum Note (Signed)
Addended by: Cleon Gustin on: 05/11/2021 04:35 PM   Modules accepted: Orders

## 2021-05-11 NOTE — Progress Notes (Signed)
05/11/2021 4:22 PM   David Schwartz [birth year] [id number]  Referring provider: Dettinger, Fransisca Kaufmann, MD [place],  [place] [id number]  Prostate Cancer   HPI: Ms Heyde is a 75yo here for followup after prostate biopsy. Biopsy revealed Gleason 3+4=7 in 2/12 cores and Gleason 3+3=6 in 1/12 cores. PSA 16.8. He has mild LUTS on terazosin. He has ED and takes tadalafil    PMH: Past Medical History:  Diagnosis Date   Arthritis    Hyperlipidemia    Hypertension     Surgical History: Past Surgical History:  Procedure Laterality Date   CARDIOVERSION N/A 01/29/2020   Procedure: CARDIOVERSION;  Surgeon: Satira Sark, MD;  Location: [facility];  Service: Cardiovascular;  Laterality: N/A;   HERNIA REPAIR     KNEE ARTHROSCOPY      Home Medications:  Allergies as of 05/11/2021   No Known Allergies      Medication List        Accurate as of May 11, 2021  4:22 PM. If you have any questions, ask your nurse or doctor.          alendronate 70 MG tablet Commonly known as: FOSAMAX Take 1 tablet (70 mg total) by mouth once a week. Take with a full glass of water on an empty stomach.   benzonatate 100 MG capsule Commonly known as: TESSALON Take 1 capsule (100 mg total) by mouth 2 (two) times daily as needed for cough.   calcium carbonate 1500 (600 Ca) MG Tabs tablet Commonly known as: OSCAL Take by mouth daily with breakfast.   Eliquis 5 MG Tabs tablet Generic drug: apixaban Take 1 tablet by mouth twice daily   ketoconazole 2 % cream Commonly known as: NIZORAL Apply 1 application topically daily.   loperamide 2 MG tablet Commonly known as: Imodium A-D Take 1 tablet (2 mg total) by mouth 4 (four) times daily as needed for diarrhea or loose stools.   losartan 25 MG tablet Commonly known as: COZAAR Take 1 tablet by mouth once daily   metoprolol succinate 50 MG 24 hr tablet Commonly known as: TOPROL-XL Take 1 tablet by mouth once daily    ondansetron 4 MG tablet Commonly known as: Zofran Take 1 tablet (4 mg total) by mouth every 8 (eight) hours as needed for nausea or vomiting.   rosuvastatin 5 MG tablet Commonly known as: CRESTOR TAKE 1 TABLET BY MOUTH  DAILY AT 2 PM   tadalafil 20 MG tablet Commonly known as: CIALIS Take 1 tablet (20 mg total) by mouth as needed for erectile dysfunction.   terazosin 2 MG capsule Commonly known as: HYTRIN Take 1 capsule (2 mg total) by mouth at bedtime.   triamcinolone cream 0.1 % Commonly known as: KENALOG Apply 1 application topically 2 (two) times daily.   Vitamin D3 25 MCG (1000 UT) Caps Take by mouth.        Allergies: No Known Allergies  Family History: Family History  Problem Relation Age of Onset   Diabetes Mother    Hypertension Mother    Early death Father    Arthritis Maternal Grandmother    Hypertension Maternal Grandfather    Hypertension Paternal Grandmother    Cancer Paternal Grandfather        lung    Social History:  reports that he has never smoked. He has never used smokeless tobacco. He reports current alcohol use. He reports that he does not use drugs.  ROS: All other review  of systems were reviewed and are negative except what is noted above in HPI  Physical Exam: BP 128/69   Pulse 77   Constitutional:  Alert and oriented, No acute distress. HEENT: [place] AT, moist mucus membranes.  Trachea midline, no masses. Cardiovascular: No clubbing, cyanosis, or edema. Respiratory: Normal respiratory effort, no increased work of breathing. GI: Abdomen is soft, nontender, nondistended, no abdominal masses GU: No CVA tenderness.  Lymph: No cervical or inguinal lymphadenopathy. Skin: No rashes, bruises or suspicious lesions. Neurologic: Grossly intact, no focal deficits, moving all 4 extremities. Psychiatric: Normal mood and affect.  Laboratory Data: Lab Results  Component Value Date   WBC 6.6 04/07/2021   HGB 14.6 04/07/2021   HCT 40.6  04/07/2021   MCV 91 04/07/2021   PLT 190 04/07/2021    Lab Results  Component Value Date   CREATININE 0.76 04/07/2021    No results found for: PSA  No results found for: TESTOSTERONE  No results found for: HGBA1C  Urinalysis    Component Value Date/Time   APPEARANCEUR Clear 02/23/2021 1045   GLUCOSEU Negative 02/23/2021 1045   BILIRUBINUR Negative 02/23/2021 1045   PROTEINUR Negative 02/23/2021 1045   NITRITE Negative 02/23/2021 1045   LEUKOCYTESUR Negative 02/23/2021 1045    Lab Results  Component Value Date   LABMICR Comment 02/23/2021    Pertinent Imaging:  No results found for this or any previous visit.  No results found for this or any previous visit.  No results found for this or any previous visit.  No results found for this or any previous visit.  No results found for this or any previous visit.  No results found for this or any previous visit.  No results found for this or any previous visit.  No results found for this or any previous visit.   Assessment & Plan:    1. Prostate cancer [facility]) I discussed the natural history of favorable intermediate risk prostate cancer with the patient and the various treatment options including active surveillance, RALP, IMRT, brachytherapy, cryotherapy, HIFU and ADT. After discussing the options the patient elects for radiation therapy. I will obtain a CT and Bone scan prior to his appointment with Dr. Tammi Klippel   No follow-ups on file.  Nicolette Bang, MD  [facility] [place]

## 2021-05-12 LAB — BASIC METABOLIC PANEL
BUN/Creatinine Ratio: 19 (ref 10–24)
BUN: 15 mg/dL (ref 8–27)
CO2: 26 mmol/L (ref 20–29)
Calcium: 9.9 mg/dL (ref 8.6–10.2)
Chloride: 103 mmol/L (ref 96–106)
Creatinine, Ser: 0.77 mg/dL (ref 0.76–1.27)
Glucose: 119 mg/dL — ABNORMAL HIGH (ref 65–99)
Potassium: 4.7 mmol/L (ref 3.5–5.2)
Sodium: 144 mmol/L (ref 134–144)
eGFR: 93 mL/min/{1.73_m2} (ref >59)

## 2021-05-16 ENCOUNTER — Other Ambulatory Visit: Payer: Self-pay

## 2021-05-16 DIAGNOSIS — R3912 Poor urinary stream: Secondary | ICD-10-CM

## 2021-05-27 ENCOUNTER — Encounter (HOSPITAL_COMMUNITY)
Admission: RE | Admit: 2021-05-27 | Discharge: 2021-05-27 | Disposition: A | Discharge status: Home / Self Care | Payer: Medicare Other | Source: Ambulatory Visit | Attending: Urology | Admitting: Urology

## 2021-05-27 ENCOUNTER — Other Ambulatory Visit: Payer: Self-pay

## 2021-05-27 ENCOUNTER — Ambulatory Visit (HOSPITAL_COMMUNITY)
Admission: RE | Admit: 2021-05-27 | Discharge: 2021-05-27 | Disposition: A | Discharge status: Home / Self Care | Payer: Medicare Other | Source: Ambulatory Visit | Attending: Urology | Admitting: Urology

## 2021-05-27 ENCOUNTER — Encounter (HOSPITAL_COMMUNITY): Payer: Self-pay

## 2021-05-27 DIAGNOSIS — C61 Malignant neoplasm of prostate: Secondary | ICD-10-CM | POA: Diagnosis present

## 2021-05-27 DIAGNOSIS — K7689 Other specified diseases of liver: Secondary | ICD-10-CM | POA: Diagnosis not present

## 2021-05-27 DIAGNOSIS — M19031 Primary osteoarthritis, right wrist: Secondary | ICD-10-CM | POA: Diagnosis not present

## 2021-05-27 DIAGNOSIS — N200 Calculus of kidney: Secondary | ICD-10-CM | POA: Diagnosis not present

## 2021-05-27 DIAGNOSIS — M19032 Primary osteoarthritis, left wrist: Secondary | ICD-10-CM | POA: Diagnosis not present

## 2021-05-27 HISTORY — DX: Malignant (primary) neoplasm, unspecified: C80.1

## 2021-05-27 MED ORDER — TECHNETIUM TC 99M MEDRONATE IV KIT
20.0000 | PACK | Freq: Once | INTRAVENOUS | Status: AC | PRN
  Administered 2021-05-27: 22 via INTRAVENOUS

## 2021-05-27 MED ORDER — IOHEXOL 300 MG/ML  SOLN
100.0000 mL | Freq: Once | INTRAMUSCULAR | Status: AC | PRN
  Administered 2021-05-27: 100 mL via INTRAVENOUS

## 2021-06-01 ENCOUNTER — Encounter: Payer: Self-pay | Admitting: Cardiology

## 2021-06-01 ENCOUNTER — Ambulatory Visit: Payer: Medicare Other | Admitting: Cardiology

## 2021-06-01 VITALS — BP 120/66 | HR 65 | Ht 68.0 in | Wt 172.2 lb

## 2021-06-01 DIAGNOSIS — I519 Heart disease, unspecified: Secondary | ICD-10-CM | POA: Diagnosis not present

## 2021-06-01 DIAGNOSIS — E782 Mixed hyperlipidemia: Secondary | ICD-10-CM | POA: Diagnosis not present

## 2021-06-01 DIAGNOSIS — I4891 Unspecified atrial fibrillation: Secondary | ICD-10-CM

## 2021-06-01 DIAGNOSIS — I10 Essential (primary) hypertension: Secondary | ICD-10-CM

## 2021-06-01 NOTE — Patient Instructions (Signed)

## 2021-06-01 NOTE — Progress Notes (Signed)
Clinical Summary David Schwartz is a 75 y.o.male seen today for follow up of the following medical problems.        1. Afib - new diagnosis noted by pcp during 12/25/19 visit - ekg from pcp reviewed, afib rate 108 - CHADS 2vasc score is at least 2 (age, HTN), started on eliquis and toprol 25mg     - s/p DCCV 01/29/20 to [facility]   - No recent palpitaitons - compliant with meds     2. 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 Prostate cancer - followed by urology - considering radiation  4. Hyperlipidemia 04/2021 TC 170 TG 130 HDL 44 LDL 103 - he is on crestor  Past Medical History:  Diagnosis Date   Arthritis    Cancer ([place])    Hyperlipidemia    Hypertension      No Known Allergies   Current Outpatient Medications  Medication Sig Dispense Refill   alendronate (FOSAMAX) 70 MG tablet Take 1 tablet (70 mg total) by mouth once a week. Take with a full glass of water on an empty stomach. 12 tablet 3   benzonatate (TESSALON) 100 MG capsule Take 1 capsule (100 mg total) by mouth 2 (two) times daily as needed for cough. 20 capsule 0   calcium carbonate (OSCAL) 1500 (600 Ca) MG TABS tablet Take by mouth daily with breakfast.     Cholecalciferol (VITAMIN D3) 1000 units CAPS Take by mouth.     ELIQUIS 5 MG TABS tablet Take 1 tablet by mouth twice daily 60 tablet 6   ketoconazole (NIZORAL) 2 % cream Apply 1 application topically daily. 60 g 1   loperamide (IMODIUM A-D) 2 MG tablet Take 1 tablet (2 mg total) by mouth 4 (four) times daily as needed for diarrhea or loose stools. 30 tablet 0   losartan (COZAAR) 25 MG tablet Take 1 tablet by mouth once daily 90 tablet 0   metoprolol succinate (TOPROL-XL) 50 MG 24 hr tablet Take 1 tablet by mouth once daily 90 tablet 3   ondansetron (ZOFRAN) 4 MG tablet Take 1 tablet  (4 mg total) by mouth every 8 (eight) hours as needed for nausea or vomiting. 20 tablet 0   rosuvastatin (CRESTOR) 5 MG tablet TAKE 1 TABLET BY MOUTH  DAILY AT 2 PM 90 tablet 3   tadalafil (CIALIS) 20 MG tablet Take 1 tablet (20 mg total) by mouth as needed for erectile dysfunction. 20 tablet 5   terazosin (HYTRIN) 2 MG capsule Take 1 capsule (2 mg total) by mouth at bedtime. 90 capsule 3   triamcinolone (KENALOG) 0.1 % Apply 1 application topically 2 (two) times daily. 80 g 3   No current facility-administered medications for this visit.     Past Surgical History:  Procedure Laterality Date   CARDIOVERSION N/A 01/29/2020   Procedure: CARDIOVERSION;  Surgeon: Satira Sark, MD;  Location: [facility];  Service: Cardiovascular;  Laterality: N/A;   HERNIA REPAIR     KNEE ARTHROSCOPY       No Known Allergies    Family History  Problem Relation Age of Onset   Diabetes Mother    Hypertension Mother    Early death Father    Arthritis Maternal Grandmother    Hypertension Maternal Grandfather  Hypertension Paternal Grandmother    Cancer Paternal Grandfather        lung     Social History [name] reports that he has never smoked. He has never used smokeless tobacco. [name] reports current alcohol use.   Review of Systems CONSTITUTIONAL: No weight loss, fever, chills, weakness or fatigue.  HEENT: Eyes: No visual loss, blurred vision, double vision or yellow sclerae.No hearing loss, sneezing, congestion, runny nose or sore throat.  SKIN: No rash or itching.  CARDIOVASCULAR: per hpi RESPIRATORY: No shortness of breath, cough or sputum.  GASTROINTESTINAL: No anorexia, nausea, vomiting or diarrhea. No abdominal pain or blood.  GENITOURINARY: No burning on urination, no polyuria NEUROLOGICAL: No headache, dizziness, syncope, paralysis, ataxia, numbness or tingling in the extremities. No change in bowel or bladder control.  MUSCULOSKELETAL: No muscle, back pain, joint pain  or stiffness.  LYMPHATICS: No enlarged nodes. No history of splenectomy.  PSYCHIATRIC: No history of depression or anxiety.  ENDOCRINOLOGIC: No reports of sweating, cold or heat intolerance. No polyuria or polydipsia.  Marland Kitchen   Physical Examination Today's Vitals   06/01/21 1406  BP: 120/66  Pulse: 65  SpO2: 97%  Weight: 172 lb 3.2 oz (78.1 kg)  Height: 5\' 8"  (1.727 m)   Body mass index is 26.18 kg/m.  Gen: resting comfortably, no acute distress HEENT: no scleral icterus, pupils equal round and reactive, no palptable cervical adenopathy,  CV: RRR, no m/r/g no jvd Resp: Clear to auscultation bilaterally GI: abdomen is soft, non-tender, non-distended, normal bowel sounds, no hepatosplenomegaly MSK: extremities are warm, no edema.  Skin: warm, no rash Neuro:  no focal deficits Psych: appropriate affect   Diagnostic Studies  01/2020 echo   IMPRESSIONS     1. Left ventricular ejection fraction, by estimation, is 35 to 40%. The  left ventricle has moderately decreased function. The left ventricle  demonstrates global hypokinesis. There is mild left ventricular  hypertrophy of the posterior segment. Left  ventricular diastolic parameters are indeterminate.   2. Right ventricular systolic function is normal. The right ventricular  size is normal. There is normal pulmonary artery systolic pressure.   3. Left atrial size was moderately dilated.   4. Right atrial size was mildly dilated.   5. The mitral valve is degenerative. Mild to moderate mitral valve  regurgitation.   6. Tricuspid valve regurgitation is moderate.   7. The aortic valve is tricuspid. Aortic valve regurgitation is mild. No  aortic stenosis is present.   8. The inferior vena cava is normal in size with greater than 50%  respiratory variability, suggesting right atrial pressure of 3 mmHg.    04/2020 echo   IMPRESSIONS     1. Left ventricular ejection fraction, by estimation, is 60 to 65%. The  left ventricle  has normal function. The left ventricle has no regional  wall motion abnormalities. Left ventricular diastolic parameters are  consistent with Grade I diastolic  dysfunction (impaired relaxation).   2. Right ventricular systolic function is normal. The right ventricular  size is normal. There is normal pulmonary artery systolic pressure.   3. Left atrial size was moderately dilated.   4. The mitral valve is normal in structure. No evidence of mitral valve  regurgitation. No evidence of mitral stenosis.   5. The aortic valve is tricuspid. Aortic valve regurgitation is mild. No  aortic stenosis is present.   6. The inferior vena cava is normal in size with greater than 50%  respiratory variability, suggesting right  atrial pressure of 3 mmHg.    Assessment and Plan  1. Afib - new diagnosis by pcp 12/25/19 - s/p DCCV. Has done very well without evidence of recurrence - continue toprol, eliquis.    2. 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 HTN  At goal continue current meds  4. Hyperlipidemia - at goal, cotinue crestor.      Arnoldo Lenis, M.D.

## 2021-06-03 ENCOUNTER — Ambulatory Visit
Admission: RE | Admit: 2021-06-03 | Discharge: 2021-06-03 | Disposition: A | Discharge status: Home / Self Care | Payer: Medicare Other | Source: Ambulatory Visit | Attending: Radiation Oncology | Admitting: Radiation Oncology

## 2021-06-03 DIAGNOSIS — C61 Malignant neoplasm of prostate: Secondary | ICD-10-CM

## 2021-06-03 NOTE — Progress Notes (Signed)
GU Location of Tumor / Histology: Prostate Ca  If Prostate Cancer, Gleason Score is 3+4=7 in 2/12 cores and Gleason 3+3=6 in 1/12 cores and PSA is (16.8 as of 05/11/2021)  Past/Anticipated interventions by urology, if any:   Dr. Warrick Parisian Plan:   1. Prostate cancer [facility]) I discussed the natural history of favorable intermediate risk prostate cancer with the patient and the various treatment options including active surveillance, RALP, IMRT, brachytherapy, cryotherapy, HIFU and ADT. After discussing the options the patient elects for radiation therapy. I will obtain a CT and Bone scan prior to his appointment with Dr. Tammi Klippel  Past/Anticipated interventions by medical oncology, if any:   Weight changes, if any: No weight changes at this time.  SHIM:  16 IPSS:  17  Bowel/Bladder complaints, if any:  Denies constipation/diarrhea, denies bladder issue at this time.  Nausea/Vomiting, if any: No complaints of nausea or vomiting.  Pain issues, if any:  3/10 score  SAFETY ISSUES: Prior radiation? No Pacemaker/ICD? No Possible current pregnancy?  Male Is the patient on methotrexate? No  Current Complaints / other details:  Wants to try and understand treatment plans for illness.

## 2021-06-03 NOTE — Progress Notes (Addendum)
Radiation Oncology         [phone] ________________________________  Initial Outpatient Consultation - Conducted via Telephone due to current COVID-19 concerns for limiting patient exposure  Name: David Schwartz MRN: [id number]  Date: 06/03/2021  DOB: [birth year]  [id number], Fransisca Kaufmann, Schwartz  McKenzie, Candee Furbish, Schwartz   REFERRING PHYSICIAN: Cleon Gustin, Schwartz  DIAGNOSIS: 75 y.o. gentleman with Stage T1c adenocarcinoma of the prostate with Gleason score of 3+4, and PSA of 16.8.    ICD-10-CM   1. Malignant neoplasm of prostate ([place])  C61       HISTORY OF PRESENT ILLNESS: David Schwartz is a 75 y.o. male with a diagnosis of prostate cancer. He was initially diagnosed with low volume Gleason 3+3 prostate cancer on 07/16/15 by Dr. Pilar Jarvis. PSA at time of diagnosis was 8.1. His PSA has fluctuated since that time but has remained relatively stable between 7-9. He had a repeat prostate biopsy in 03/2016 that was negative and a surveillance prostate MRI in 07/2017, that was without any evidence of high-grade disease. He has continued with close observation in active surveillance under the care of Dr. Alyson Ingles since 2019.  His PSA has fluctuated over the years but more recently, his PSA jumped from 9.8 in 08/2020 to 15.4 on 02/16/2021.  This was repeated a week later on 02/23/2021 and remained elevated at 16.8. The patient proceeded to transrectal ultrasound with 12 biopsies of the prostate on 05/04/21.  The prostate volume measured 42.3 cc.  Out of 13 core biopsies, 3 were positive.  The maximum Gleason score was 3+4, and this was seen in the left apex and one of two left mid cores. Additionally, Gleason 3+3 was seen in the left base lateral.  He had disease staging imaging with CT A/P and Bone scan on 05/27/21 with inconclusive findings. The CT A/P showed scattered sclerotic lesions that may represent bone islands but difficult to definitively exclude metastatic disease. Bone scan showed only a  solitary focus of increased radiotracer uptake within the anterior aspect of the right fifth rib in the region of the costochondral cartilage which could represent a subacute healing fracture, or potentially a sclerotic metastatic lesion.  The patient reviewed the biopsy results with his urologist and he has kindly been referred today for discussion of potential radiation treatment options.   PREVIOUS RADIATION THERAPY: No  PAST MEDICAL HISTORY:  Past Medical History:  Diagnosis Date   Arthritis    Cancer ([place])    Hyperlipidemia    Hypertension       PAST SURGICAL HISTORY: Past Surgical History:  Procedure Laterality Date   CARDIOVERSION N/A 01/29/2020   Procedure: CARDIOVERSION;  Surgeon: Satira Sark, Schwartz;  Location: [facility];  Service: Cardiovascular;  Laterality: N/A;   HERNIA REPAIR     KNEE ARTHROSCOPY      FAMILY HISTORY:  Family History  Problem Relation Age of Onset   Diabetes Mother    Hypertension Mother    Early death Father    Arthritis Maternal Grandmother    Hypertension Maternal Grandfather    Hypertension Paternal Grandmother    Cancer Paternal Grandfather        lung    SOCIAL HISTORY:  Social History   Socioeconomic History   Marital status: Married    Spouse name: Katharine Look   Number of children: Not on file   Years of education: 12   Highest education level: 12th grade  Occupational History   Occupation: Retired  Tobacco Use  Smoking status: Never   Smokeless tobacco: Never  Vaping Use   Vaping Use: Never used  Substance and Sexual Activity   Alcohol use: Yes    Comment: occ   Drug use: Never   Sexual activity: Not on file  Other Topics Concern   Not on file  Social History Narrative   Living with wife - son lives nearby   Social Determinants of Health   Financial Resource Strain: Low Risk    Difficulty of Paying Living Expenses: Not hard at all  Food Insecurity: No Food Insecurity   Worried About Charity fundraiser in the  Last Year: Never true   Arboriculturist in the Last Year: Never true  Transportation Needs: No Transportation Needs   Lack of Transportation (Medical): No   Lack of Transportation (Non-Medical): No  Physical Activity: Insufficiently Active   Days of Exercise per Week: 7 days   Minutes of Exercise per Session: 20 min  Stress: No Stress Concern Present   Feeling of Stress : Not at all  Social Connections: Socially Integrated   Frequency of Communication with Friends and Family: More than three times a week   Frequency of Social Gatherings with Friends and Family: More than three times a week   Attends Religious Services: More than 4 times per year   Active Member of [employer] or Organizations: Yes   Attends Archivist Meetings: 1 to 4 times per year   Marital Status: Married  Human resources officer Violence: Not At Risk   Fear of Current or Ex-Partner: No   Emotionally Abused: No   Physically Abused: No   Sexually Abused: No    ALLERGIES: Patient has no known allergies.  MEDICATIONS:  Current Outpatient Medications  Medication Sig Dispense Refill   alendronate (FOSAMAX) 70 MG tablet Take 1 tablet (70 mg total) by mouth once a week. Take with a full glass of water on an empty stomach. 12 tablet 3   benzonatate (TESSALON) 100 MG capsule Take 1 capsule (100 mg total) by mouth 2 (two) times daily as needed for cough. (Patient not taking: Reported on 06/01/2021) 20 capsule 0   calcium carbonate (OSCAL) 1500 (600 Ca) MG TABS tablet Take by mouth daily with breakfast.     Cholecalciferol (VITAMIN D3) 1000 units CAPS Take by mouth.     ELIQUIS 5 MG TABS tablet Take 1 tablet by mouth twice daily 60 tablet 6   ketoconazole (NIZORAL) 2 % cream Apply 1 application topically daily. 60 g 1   loperamide (IMODIUM A-D) 2 MG tablet Take 1 tablet (2 mg total) by mouth 4 (four) times daily as needed for diarrhea or loose stools. (Patient not taking: Reported on 06/01/2021) 30 tablet 0   losartan (COZAAR)  25 MG tablet Take 1 tablet by mouth once daily 90 tablet 0   metoprolol succinate (TOPROL-XL) 50 MG 24 hr tablet Take 1 tablet by mouth once daily 90 tablet 3   ondansetron (ZOFRAN) 4 MG tablet Take 1 tablet (4 mg total) by mouth every 8 (eight) hours as needed for nausea or vomiting. (Patient not taking: Reported on 06/01/2021) 20 tablet 0   rosuvastatin (CRESTOR) 5 MG tablet TAKE 1 TABLET BY MOUTH  DAILY AT 2 PM (Patient taking differently: TAKE 1 TABLET BY MOUTH  DAILY AT 2 PM) 90 tablet 3   tadalafil (CIALIS) 20 MG tablet Take 1 tablet (20 mg total) by mouth as needed for erectile dysfunction. 20 tablet 5  terazosin (HYTRIN) 2 MG capsule Take 1 capsule (2 mg total) by mouth at bedtime. 90 capsule 3   triamcinolone (KENALOG) 0.1 % Apply 1 application topically 2 (two) times daily. 80 g 3   No current facility-administered medications for this encounter.    REVIEW OF SYSTEMS:  On review of systems, the patient reports that he is doing well overall. He denies any chest pain, shortness of breath, cough, fevers, chills, night sweats, unintended weight changes. He denies any bowel disturbances, and denies abdominal pain, nausea or vomiting. He denies any new musculoskeletal or joint aches or pains. His IPSS was 17, indicating moderate urinary symptoms. His SHIM was 16, indicating he has moderate erectile dysfunction. A complete review of systems is obtained and is otherwise negative.    PHYSICAL EXAM:  Wt Readings from Last 3 Encounters:  06/01/21 172 lb 3.2 oz (78.1 kg)  04/07/21 170 lb (77.1 kg)  01/04/21 172 lb (78 kg)   Temp Readings from Last 3 Encounters:  05/04/21 97.8 F (36.6 C) (Oral)  08/25/20 98.6 F (37 C)  04/08/20 (!) 97.2 F (36.2 C)   BP Readings from Last 3 Encounters:  06/01/21 120/66  05/11/21 128/69  05/04/21 140/78   Pulse Readings from Last 3 Encounters:  06/01/21 65  05/11/21 77  05/04/21 72    /10  Physical exam not performed in light of telephone  encounter.   KPS = 90  100 - Normal; no complaints; no evidence of disease. 90   - Able to carry on normal activity; minor signs or symptoms of disease. 80   - Normal activity with effort; some signs or symptoms of disease. 22   - Cares for self; unable to carry on normal activity or to do active work. 60   - Requires occasional assistance, but is able to care for most of his personal needs. 50   - Requires considerable assistance and frequent medical care. 74   - Disabled; requires special care and assistance. 25   - Severely disabled; hospital admission is indicated although death not imminent. 40   - Very sick; hospital admission necessary; active supportive treatment necessary. 10   - Moribund; fatal processes progressing rapidly. 0     - Dead  Karnofsky DA, Abelmann [place], Craver LS and Burchenal [facility] [phone]) The use of the nitrogen mustards in the palliative treatment of carcinoma: with particular reference to bronchogenic carcinoma Cancer 1 634-56  LABORATORY DATA:  Lab Results  Component Value Date   WBC 6.6 04/07/2021   HGB 14.6 04/07/2021   HCT 40.6 04/07/2021   MCV 91 04/07/2021   PLT 190 04/07/2021   Lab Results  Component Value Date   NA 144 05/11/2021   K 4.7 05/11/2021   CL 103 05/11/2021   CO2 26 05/11/2021   Lab Results  Component Value Date   ALT 16 04/07/2021   AST 20 04/07/2021   ALKPHOS 52 04/07/2021   BILITOT 0.6 04/07/2021     RADIOGRAPHY: CT Abdomen Pelvis W Wo Contrast  Result Date: 05/30/2021 CLINICAL DATA:  Prostate cancer. EXAM: CT ABDOMEN AND PELVIS WITHOUT AND WITH CONTRAST TECHNIQUE: Multidetector CT imaging of the abdomen and pelvis was performed following the standard protocol before and following the bolus administration of intravenous contrast. CONTRAST:  136mL OMNIPAQUE IOHEXOL 300 MG/ML  SOLN COMPARISON:  None. FINDINGS: Lower chest: Lung bases are clear. Heart size normal. No pericardial effusion. Trace right pleural fluid. Distal esophagus  is grossly unremarkable. Hepatobiliary: Fluid density 4.8  cm cyst in the liver. Liver and gallbladder are otherwise unremarkable. No biliary ductal dilatation. Pancreas: Negative. Spleen: Negative. Adrenals/Urinary Tract: Adrenal glands are unremarkable. Punctate stone in the right kidney. Low-attenuation lesions in the kidneys measure up to 9.1 x 11.7 cm on the left, compatible with cysts. Those measuring less than 1 cm in size are difficult to definitively characterize but statistically, cysts are likely. Lower half of the right ureter and proximal left ureter are poorly opacified, limiting evaluation. Otherwise, no filling defects in the opacified portions of the intrarenal collecting systems, ureters or dependent bladder. Stomach/Bowel: Stomach, small bowel, appendix and colon are unremarkable. Vascular/Lymphatic: Atherosclerotic calcification of the aorta. No pathologically enlarged lymph nodes. Reproductive: Prostate is enlarged. Left scrotal hydrocele with skin thickening. Other: No free fluid.  Mesenteries and peritoneum are unremarkable. Musculoskeletal: Degenerative changes in the spine. Sclerotic lesions are seen in the medial left eleventh posterior rib, left femoral head and right pubic bone. These may represent bone islands. No worrisome lytic or sclerotic lesions. IMPRESSION: 1. Enlarged prostate. Scattered sclerotic lesions may represent bone islands. Difficult to definitively exclude metastatic disease. Recommend attention on follow-up. 2. Trace right pleural fluid. 3. Punctate right renal stone. 4.  Aortic atherosclerosis (ICD10-I70.0). Electronically Signed   By: Lorin Picket M.D.   On: 05/30/2021 10:34   NM Bone Scan Whole Body  Result Date: 05/29/2021 CLINICAL DATA:  Prostate cancer staging EXAM: NUCLEAR MEDICINE WHOLE BODY BONE SCAN TECHNIQUE: Whole body anterior and posterior images were obtained approximately 3 hours after intravenous injection of radiopharmaceutical.  RADIOPHARMACEUTICALS:  22 mCi Technetium-28m MDP IV COMPARISON:  CT abdomen/pelvis 05/27/2021 FINDINGS: Normal radiotracer uptake in the renal parenchyma with excretion into the bladder. Symmetric bilateral uptake in the shoulder girdles, elbow joints and medial compartments of the knee joints consistent with degenerative change. Similarly, degenerative change present in both wrists and the MCP joints. Solitary focus of asymmetric radiotracer uptake in the anterior aspect of the right fifth rib. IMPRESSION: 1. Solitary focus of increased radiotracer uptake within the anterior aspect of the right fifth rib in the region of the costochondral cartilage. Correlation with the recently obtained CT scan of the abdomen and pelvis partially images this region and demonstrates focal sclerosis and irregularity of the rib cortex. This could represent a subacute healing fracture, or potentially a sclerotic metastatic lesion. 2. Otherwise, normal degenerative uptake bilaterally as above. Electronically Signed   By: Jacqulynn Cadet M.D.   On: 05/29/2021 08:18      IMPRESSION/PLAN: This visit was conducted via Telephone to spare the patient unnecessary potential exposure in the healthcare setting during the current COVID-19 pandemic. 1. 75 y.o. gentleman with Stage T1c adenocarcinoma of the prostate with Gleason Score of 3+4, and PSA of 16.8. We discussed the patient's workup and outlined the nature of prostate cancer in this setting. The patient's T stage, Gleason's score, and PSA put him into the intermediate risk group.  There is still some question as to whether the abnormality in the right fifth rib seen on recent bone scan is of any concern.  In light of the CT and bone scans being inconclusive, we have recommended proceeding with a PSMA PET scan for further evaluation.  Pending this scan confirms localized disease within the prostate only, he is eligible for a variety of potential treatment options including  brachytherapy, 5.5 weeks of external radiation, or prostatectomy.  If the area in the right fifth rib is found to be consistent with metastasis on PET, we would likely  recommend a longer course, 8 weeks of prostate IMRT in addition to stereotactic body radiotherapy  (SBRT) for treatment of the oligometastatic deposit.  In either case, he would prefer to have the longer course of daily radiotherapy to the prostate performed at [facility] in [place] since this is much closer to his home.  We did discuss that the SBRT, if appropriate, would need to be completed here locally in [place] since this is a more specialized treatment and requires specific technology/equipment that is not available in [place].  We discussed the available radiation techniques, and focused on the details and logistics of delivery. We discussed and outlined the risks, benefits, short and long-term effects associated with radiotherapy and compared and contrasted these with prostatectomy. We discussed the role of SpaceOAR in reducing the rectal toxicity associated with radiotherapy. He appears to have a good understanding of his disease and our treatment recommendations.  He was encouraged to ask questions that were answered to his stated satisfaction.  At the end of the conversation the patient is interested in moving forward with additional imaging with PSMA PET scan to determine whether he has localized prostate cancer or possibly oligometastatic prostate cancer which could change our treatment recommendations.  We will work to get this scan scheduled as soon as possible and we will plan to follow-up with him by phone once we have the results to review and further discuss our treatment recommendations at that time.  We will share our discussion with Dr. Alyson Ingles and look forward to continuing to participate in his care.   Given current concerns for patient exposure during the COVID-19 pandemic, this encounter was conducted via telephone.  The patient was notified in advance and was offered a MyChart meeting to allow for face to face communication but unfortunately reported that he did not have the appropriate resources/technology to support such a visit and instead preferred to proceed with telephone consult. The patient has given verbal consent for this type of encounter. The attendants for this meeting include David Schwartz, Kamilah Correia PA-C, and patient David Schwartz and his wife During the encounter, David Schwartz, and Freeman Caldron, PA-C were located at [facility] Radiation Oncology Department.  Patient Cephas Revard and his wife for located at home.  We personally spent 70 minutes in this encounter including chart review, reviewing radiological studies, meeting face-to-face with the patient, entering orders and completing documentation.     Nicholos Johns, PA-C    David Pita, Schwartz  [facility] Direct Dial: [phone]  Fax: [phone] [email or web address]  Skype  [email or web address]  This document serves as a record of services personally performed by David Pita, Schwartz and Freeman Caldron, PA-C. It was created on their behalf by Wilburn Mylar, a trained medical scribe. The creation of this record is based on the scribe's personal observations and the provider's statements to them. This document has been checked and approved by the attending provider.

## 2021-06-06 NOTE — Progress Notes (Signed)
Spoke with patient via telephone to introduce myself as the prostate nurse navigator and discussed my role.  No barriers to care identified at this time.  Patient is waiting for PSMA pet scan to be scheduled before deciding on treatment options.  I provided my contact information and asked for him to call me with questions or concerns.  Verbalized understanding.

## 2021-06-07 ENCOUNTER — Telehealth: Payer: Self-pay | Admitting: *Deleted

## 2021-06-07 ENCOUNTER — Encounter: Payer: Self-pay | Admitting: Urology

## 2021-06-07 NOTE — Telephone Encounter (Signed)
Called patient to ask question, lvm for a return call 

## 2021-06-07 NOTE — Progress Notes (Signed)
Patient states doing well. No symptoms reported at this time.  No urinary management medications taken at this time. Urology follow up w/ "[facility]" on October 12th, 2022.  I-PSS Score of 2 (mild). Meaningful use complete.  Patient notified of 10:00am-06/10/21 telephone appointment and understands.

## 2021-06-08 ENCOUNTER — Encounter (HOSPITAL_COMMUNITY): Payer: Medicare Other

## 2021-06-09 ENCOUNTER — Other Ambulatory Visit: Payer: Self-pay

## 2021-06-09 ENCOUNTER — Encounter (HOSPITAL_COMMUNITY)
Admission: RE | Admit: 2021-06-09 | Discharge: 2021-06-09 | Disposition: A | Discharge status: Home / Self Care | Payer: Medicare Other | Source: Ambulatory Visit | Attending: Urology | Admitting: Urology

## 2021-06-09 DIAGNOSIS — S2241XA Multiple fractures of ribs, right side, initial encounter for closed fracture: Secondary | ICD-10-CM | POA: Diagnosis not present

## 2021-06-09 DIAGNOSIS — I2510 Atherosclerotic heart disease of native coronary artery without angina pectoris: Secondary | ICD-10-CM | POA: Diagnosis not present

## 2021-06-09 DIAGNOSIS — C61 Malignant neoplasm of prostate: Secondary | ICD-10-CM | POA: Insufficient documentation

## 2021-06-09 DIAGNOSIS — N281 Cyst of kidney, acquired: Secondary | ICD-10-CM | POA: Diagnosis not present

## 2021-06-09 MED ORDER — PIFLIFOLASTAT F 18 (PYLARIFY) INJECTION
9.0000 | Freq: Once | INTRAVENOUS | Status: AC
  Administered 2021-06-09: 10 via INTRAVENOUS

## 2021-06-09 NOTE — Addendum Note (Signed)
Encounter addended by: Freeman Caldron, PA-C on: 06/09/2021 12:44 PM  Actions taken: Clinical Note Signed

## 2021-06-10 ENCOUNTER — Telehealth: Payer: Self-pay | Admitting: *Deleted

## 2021-06-10 ENCOUNTER — Ambulatory Visit
Admission: RE | Admit: 2021-06-10 | Discharge: 2021-06-10 | Disposition: A | Discharge status: Home / Self Care | Payer: Medicare Other | Source: Ambulatory Visit | Attending: Urology | Admitting: Urology

## 2021-06-10 DIAGNOSIS — C61 Malignant neoplasm of prostate: Secondary | ICD-10-CM

## 2021-06-10 NOTE — Progress Notes (Addendum)
Radiation Oncology         [phone] ________________________________ Outpatient Follow up Visit - Conducted via Telephone due to current COVID-19 concerns for limiting patient exposure  Name: David Schwartz MRN: [id number]  Date: 06/10/2021  DOB: April 21, 1946  [id number], Fransisca Kaufmann, MD  McKenzie, Candee Furbish, MD   REFERRING PHYSICIAN: Cleon Gustin, MD  DIAGNOSIS: 75 y.o. gentleman with Stage T1c adenocarcinoma of the prostate with Gleason score of 3+4, and PSA of 16.8.    ICD-10-CM   1. Malignant neoplasm of prostate ([place])  C61       HISTORY OF PRESENT ILLNESS: David Schwartz is a 75 y.o. male with a diagnosis of prostate cancer. He was initially diagnosed with low volume Gleason 3+3 prostate cancer on 07/16/15 by Dr. Pilar Jarvis. PSA at time of diagnosis was 8.1. His PSA has fluctuated since that time but has remained relatively stable between 7-9. He had a repeat prostate biopsy in 03/2016 that was negative and a surveillance prostate MRI in 07/2017, that was without any evidence of high-grade disease. He has continued with close observation in active surveillance under the care of Dr. Alyson Ingles since 2019.  His PSA has fluctuated over the years but more recently, his PSA jumped from 9.8 in 08/2020 to 15.4 on 02/16/2021.  This was repeated a week later on 02/23/2021 and remained elevated at 16.8. The patient proceeded to transrectal ultrasound with 12 biopsies of the prostate on 05/04/21.  The prostate volume measured 42.3 cc.  Out of 13 core biopsies, 3 were positive.  The maximum Gleason score was 3+4, and this was seen in the left apex and one of two left mid cores. Additionally, Gleason 3+3 was seen in the left base lateral.  He had disease staging imaging with CT A/P and Bone scan on 05/27/21 with inconclusive findings. The CT A/P showed scattered sclerotic lesions that may represent bone islands but difficult to definitively exclude metastatic disease. Bone scan showed only a  solitary focus of increased radiotracer uptake within the anterior aspect of the right fifth rib in the region of the costochondral cartilage which could represent a subacute healing fracture, or potentially a sclerotic metastatic lesion.    We initially met with the patient on 06/03/2021 for discussion of potential radiation treatment options and the decision at that time was to proceed with a PSMA PET scan for further evaluation of the abnormality seen on his staging imaging.  The PSMA PET scan was performed on 06/09/2021 and confirms localized disease only.  The area of concern along the right anterior fifth rib represents a subacute fracture and the small sclerotic foci in the pelvis, left femoral head and right pubic bone as well as a left posterior 11th rib or without radiotracer accumulation indicating these are most likely small bone islands and do not appear to be osseous metastases.  I spoke with the patient and his wife by phone this morning to review these findings and confirm treatment recommendations.   PREVIOUS RADIATION THERAPY: No  PAST MEDICAL HISTORY:  Past Medical History:  Diagnosis Date   Arthritis    Cancer ([place])    Hyperlipidemia    Hypertension       PAST SURGICAL HISTORY: Past Surgical History:  Procedure Laterality Date   CARDIOVERSION N/A 01/29/2020   Procedure: CARDIOVERSION;  Surgeon: Satira Sark, MD;  Location: [facility];  Service: Cardiovascular;  Laterality: N/A;   HERNIA REPAIR     KNEE ARTHROSCOPY  FAMILY HISTORY:  Family History  Problem Relation Age of Onset   Diabetes Mother    Hypertension Mother    Early death Father    Arthritis Maternal Grandmother    Hypertension Maternal Grandfather    Hypertension Paternal Grandmother    Cancer Paternal Grandfather        lung    SOCIAL HISTORY:  Social History   Socioeconomic History   Marital status: Married    Spouse name: David Schwartz   Number of children: Not on file   Years of education:  12   Highest education level: 12th grade  Occupational History   Occupation: Retired  Tobacco Use   Smoking status: Never   Smokeless tobacco: Never  Vaping Use   Vaping Use: Never used  Substance and Sexual Activity   Alcohol use: Yes    Comment: occ   Drug use: Never   Sexual activity: Not on file  Other Topics Concern   Not on file  Social History Narrative   Living with wife - son lives nearby   Social Determinants of Health   Financial Resource Strain: Low Risk    Difficulty of Paying Living Expenses: Not hard at all  Food Insecurity: No Food Insecurity   Worried About Charity fundraiser in the Last Year: Never true   Arboriculturist in the Last Year: Never true  Transportation Needs: No Transportation Needs   Lack of Transportation (Medical): No   Lack of Transportation (Non-Medical): No  Physical Activity: Insufficiently Active   Days of Exercise per Week: 7 days   Minutes of Exercise per Session: 20 min  Stress: No Stress Concern Present   Feeling of Stress : Not at all  Social Connections: Socially Integrated   Frequency of Communication with Friends and Family: More than three times a week   Frequency of Social Gatherings with Friends and Family: More than three times a week   Attends Religious Services: More than 4 times per year   Active Member of [employer] or Organizations: Yes   Attends Archivist Meetings: 1 to 4 times per year   Marital Status: Married  Human resources officer Violence: Not At Risk   Fear of Current or Ex-Partner: No   Emotionally Abused: No   Physically Abused: No   Sexually Abused: No    ALLERGIES: Patient has no known allergies.  MEDICATIONS:  Current Outpatient Medications  Medication Sig Dispense Refill   alendronate (FOSAMAX) 70 MG tablet Take 1 tablet (70 mg total) by mouth once a week. Take with a full glass of water on an empty stomach. 12 tablet 3   benzonatate (TESSALON) 100 MG capsule Take 1 capsule (100 mg total) by  mouth 2 (two) times daily as needed for cough. (Patient not taking: No sig reported) 20 capsule 0   calcium carbonate (OSCAL) 1500 (600 Ca) MG TABS tablet Take by mouth daily with breakfast.     Cholecalciferol (VITAMIN D3) 1000 units CAPS Take by mouth.     ELIQUIS 5 MG TABS tablet Take 1 tablet by mouth twice daily 60 tablet 6   ketoconazole (NIZORAL) 2 % cream Apply 1 application topically daily. 60 g 1   loperamide (IMODIUM A-D) 2 MG tablet Take 1 tablet (2 mg total) by mouth 4 (four) times daily as needed for diarrhea or loose stools. (Patient not taking: Reported on 06/01/2021) 30 tablet 0   losartan (COZAAR) 25 MG tablet Take 1 tablet by mouth once daily 90  tablet 0   metoprolol succinate (TOPROL-XL) 50 MG 24 hr tablet Take 1 tablet by mouth once daily 90 tablet 3   ondansetron (ZOFRAN) 4 MG tablet Take 1 tablet (4 mg total) by mouth every 8 (eight) hours as needed for nausea or vomiting. (Patient not taking: Reported on 06/01/2021) 20 tablet 0   rosuvastatin (CRESTOR) 5 MG tablet TAKE 1 TABLET BY MOUTH  DAILY AT 2 PM (Patient taking differently: TAKE 1 TABLET BY MOUTH  DAILY AT 2 PM) 90 tablet 3   tadalafil (CIALIS) 20 MG tablet Take 1 tablet (20 mg total) by mouth as needed for erectile dysfunction. 20 tablet 5   terazosin (HYTRIN) 2 MG capsule Take 1 capsule (2 mg total) by mouth at bedtime. 90 capsule 3   triamcinolone (KENALOG) 0.1 % Apply 1 application topically 2 (two) times daily. 80 g 3   No current facility-administered medications for this encounter.    REVIEW OF SYSTEMS:  On review of systems, the patient reports that he is doing well overall. He denies any chest pain, shortness of breath, cough, fevers, chills, night sweats, unintended weight changes. He denies any bowel disturbances, and denies abdominal pain, nausea or vomiting. He denies any new musculoskeletal or joint aches or pains. His IPSS was 17, indicating moderate urinary symptoms. His SHIM was 16, indicating he has  moderate erectile dysfunction. A complete review of systems is obtained and is otherwise negative.    PHYSICAL EXAM:  Wt Readings from Last 3 Encounters:  06/01/21 172 lb 3.2 oz (78.1 kg)  04/07/21 170 lb (77.1 kg)  01/04/21 172 lb (78 kg)   Temp Readings from Last 3 Encounters:  05/04/21 97.8 F (36.6 C) (Oral)  08/25/20 98.6 F (37 C)  04/08/20 (!) 97.2 F (36.2 C)   BP Readings from Last 3 Encounters:  06/01/21 120/66  05/11/21 128/69  05/04/21 140/78   Pulse Readings from Last 3 Encounters:  06/01/21 65  05/11/21 77  05/04/21 72   Pain Assessment Pain Score: 0-No pain/10  Physical exam not performed in light of telephone follow-up visit format.   KPS = 90  100 - Normal; no complaints; no evidence of disease. 90   - Able to carry on normal activity; minor signs or symptoms of disease. 80   - Normal activity with effort; some signs or symptoms of disease. 26   - Cares for self; unable to carry on normal activity or to do active work. 60   - Requires occasional assistance, but is able to care for most of his personal needs. 50   - Requires considerable assistance and frequent medical care. 52   - Disabled; requires special care and assistance. 58   - Severely disabled; hospital admission is indicated although death not imminent. 31   - Very sick; hospital admission necessary; active supportive treatment necessary. 10   - Moribund; fatal processes progressing rapidly. 0     - Dead  Karnofsky DA, Abelmann [place], Craver LS and Burchenal [facility] [phone]) The use of the nitrogen mustards in the palliative treatment of carcinoma: with particular reference to bronchogenic carcinoma Cancer 1 634-56  LABORATORY DATA:  Lab Results  Component Value Date   WBC 6.6 04/07/2021   HGB 14.6 04/07/2021   HCT 40.6 04/07/2021   MCV 91 04/07/2021   PLT 190 04/07/2021   Lab Results  Component Value Date   NA 144 05/11/2021   K 4.7 05/11/2021   CL 103 05/11/2021   CO2 26 05/11/2021  Lab Results  Component Value Date   ALT 16 04/07/2021   AST 20 04/07/2021   ALKPHOS 52 04/07/2021   BILITOT 0.6 04/07/2021     RADIOGRAPHY: CT Abdomen Pelvis W Wo Contrast  Result Date: 05/30/2021 CLINICAL DATA:  Prostate cancer. EXAM: CT ABDOMEN AND PELVIS WITHOUT AND WITH CONTRAST TECHNIQUE: Multidetector CT imaging of the abdomen and pelvis was performed following the standard protocol before and following the bolus administration of intravenous contrast. CONTRAST:  159m OMNIPAQUE IOHEXOL 300 MG/ML  SOLN COMPARISON:  None. FINDINGS: Lower chest: Lung bases are clear. Heart size normal. No pericardial effusion. Trace right pleural fluid. Distal esophagus is grossly unremarkable. Hepatobiliary: Fluid density 4.8 cm cyst in the liver. Liver and gallbladder are otherwise unremarkable. No biliary ductal dilatation. Pancreas: Negative. Spleen: Negative. Adrenals/Urinary Tract: Adrenal glands are unremarkable. Punctate stone in the right kidney. Low-attenuation lesions in the kidneys measure up to 9.1 x 11.7 cm on the left, compatible with cysts. Those measuring less than 1 cm in size are difficult to definitively characterize but statistically, cysts are likely. Lower half of the right ureter and proximal left ureter are poorly opacified, limiting evaluation. Otherwise, no filling defects in the opacified portions of the intrarenal collecting systems, ureters or dependent bladder. Stomach/Bowel: Stomach, small bowel, appendix and colon are unremarkable. Vascular/Lymphatic: Atherosclerotic calcification of the aorta. No pathologically enlarged lymph nodes. Reproductive: Prostate is enlarged. Left scrotal hydrocele with skin thickening. Other: No free fluid.  Mesenteries and peritoneum are unremarkable. Musculoskeletal: Degenerative changes in the spine. Sclerotic lesions are seen in the medial left eleventh posterior rib, left femoral head and right pubic bone. These may represent bone islands. No  worrisome lytic or sclerotic lesions. IMPRESSION: 1. Enlarged prostate. Scattered sclerotic lesions may represent bone islands. Difficult to definitively exclude metastatic disease. Recommend attention on follow-up. 2. Trace right pleural fluid. 3. Punctate right renal stone. 4.  Aortic atherosclerosis (ICD10-I70.0). Electronically Signed   By: MLorin PicketM.D.   On: 05/30/2021 10:34   NM Bone Scan Whole Body  Result Date: 05/29/2021 CLINICAL DATA:  Prostate cancer staging EXAM: NUCLEAR MEDICINE WHOLE BODY BONE SCAN TECHNIQUE: Whole body anterior and posterior images were obtained approximately 3 hours after intravenous injection of radiopharmaceutical. RADIOPHARMACEUTICALS:  22 mCi Technetium-94mDP IV COMPARISON:  CT abdomen/pelvis 05/27/2021 FINDINGS: Normal radiotracer uptake in the renal parenchyma with excretion into the bladder. Symmetric bilateral uptake in the shoulder girdles, elbow joints and medial compartments of the knee joints consistent with degenerative change. Similarly, degenerative change present in both wrists and the MCP joints. Solitary focus of asymmetric radiotracer uptake in the anterior aspect of the right fifth rib. IMPRESSION: 1. Solitary focus of increased radiotracer uptake within the anterior aspect of the right fifth rib in the region of the costochondral cartilage. Correlation with the recently obtained CT scan of the abdomen and pelvis partially images this region and demonstrates focal sclerosis and irregularity of the rib cortex. This could represent a subacute healing fracture, or potentially a sclerotic metastatic lesion. 2. Otherwise, normal degenerative uptake bilaterally as above. Electronically Signed   By: HeJacqulynn Cadet.D.   On: 05/29/2021 08:18      IMPRESSION/PLAN: This visit was conducted via Telephone to spare the patient unnecessary potential exposure in the healthcare setting during the current COVID-19 pandemic. 1. [age].o. gentleman with Stage T1c  adenocarcinoma of the prostate with Gleason Score of 3+4, and PSA of 16.8. Today, I talked to the patient and family about the findings and  workup thus far. We reviewed the results from the recent PSMA PET scan which fortunately confirm localized disease only.  Accordingly, he is eligible for a variety of potential treatment options including prostatectomy, brachytherapy, or 5.5 weeks of daily external beam radiation. We discussed the available radiation techniques, and focused on the details and logistics of delivery. The patient is not felt to be an ideal candidate for brachytherapy given his moderate to severe LUTS despite medical therapy. He is also not felt to be an ideal surgical candidate given his advanced age and medical comorbidities. Therefore, we discussed the available radiation techniques, and focused on the details and logistics of the delivery of daily external beam radiation. We discussed and outlined the risks, benefits, short and long-term effects associated with radiotherapy and compared and contrasted these with prostatectomy. We discussed the role of SpaceOAR in reducing the rectal toxicity associated with radiotherapy. The patient was encouraged to ask questions that were answered to his stated satisfaction.     At the end of the conversation the patient is interested in proceeding with the recommended 5.5 week course of external beam therapy but prefers to have the daily treatments performed at [facility] in [place] since this is much closer to his home. We will make a referral to Dr. Lynnette Caffey at [facility] and will share our discussion with Dr. Alyson Ingles so that he can begin coordinating for fiducial markers and SpaceOAR gel placement, prior to simulation, to reduce rectal toxicity from radiotherapy. The patient appears to have a good understanding of his disease and our treatment recommendations which are of curative intent and is in agreement with the stated plan.  Therefore, we will  move forward with treatment planning accordingly, in anticipation of him beginning IMRT in the near future at [facility].   Given current concerns for patient exposure during the COVID-19 pandemic, this encounter was conducted via telephone. The patient was notified in advance and was offered a MyChart meeting to allow for face to face communication but unfortunately reported that he did not have the appropriate resources/technology to support such a visit and instead preferred to proceed with telephone follow-up visit The patient has given verbal consent for this type of encounter. The attendants for this meeting include Quaneshia Wareing PA-C, and patient, Mcgwire Dasaro and his wife During the encounter, Freeman Caldron, PA-C was located at [facility] Radiation Oncology Department.  Patient, Pepe Mineau and his wife were located at home.  I personally spent 25 minutes in this encounter including chart review, reviewing radiological studies, meeting face-to-face with the patient, entering orders and completing documentation.    Nicholos Johns, PA-C    Tyler Pita, MD  [facility] Direct Dial: [phone]  Fax: [phone] [email or web address]  Skype  LinkedIn

## 2021-06-10 NOTE — Telephone Encounter (Signed)
CALLED [facility] AND SPOKE WITH MEGAN AND SHE WILL GET THIS PATIENT SCHEDULED WITH DR. MORRIS AND CALL ME BACK WITH AN APPT. DAY AND TIME

## 2021-06-15 ENCOUNTER — Ambulatory Visit (INDEPENDENT_AMBULATORY_CARE_PROVIDER_SITE_OTHER): Payer: Medicare Other | Admitting: Urology

## 2021-06-15 ENCOUNTER — Telehealth: Payer: Self-pay | Admitting: Cardiology

## 2021-06-15 ENCOUNTER — Other Ambulatory Visit: Payer: Self-pay

## 2021-06-15 ENCOUNTER — Encounter: Payer: Self-pay | Admitting: Urology

## 2021-06-15 VITALS — BP 132/66 | HR 63

## 2021-06-15 DIAGNOSIS — N5201 Erectile dysfunction due to arterial insufficiency: Secondary | ICD-10-CM

## 2021-06-15 DIAGNOSIS — C61 Malignant neoplasm of prostate: Secondary | ICD-10-CM

## 2021-06-15 DIAGNOSIS — R3912 Poor urinary stream: Secondary | ICD-10-CM

## 2021-06-15 DIAGNOSIS — N401 Enlarged prostate with lower urinary tract symptoms: Secondary | ICD-10-CM | POA: Diagnosis not present

## 2021-06-15 DIAGNOSIS — N138 Other obstructive and reflux uropathy: Secondary | ICD-10-CM | POA: Diagnosis not present

## 2021-06-15 LAB — URINALYSIS, ROUTINE W REFLEX MICROSCOPIC
Bilirubin, UA: NEGATIVE
Glucose, UA: NEGATIVE
Ketones, UA: NEGATIVE
Leukocytes,UA: NEGATIVE
Nitrite, UA: NEGATIVE
Protein,UA: NEGATIVE
RBC, UA: NEGATIVE
Specific Gravity, UA: 1.015 (ref 1.005–1.030)
Urobilinogen, Ur: 0.2 mg/dL (ref 0.2–1.0)
pH, UA: 7.5 (ref 5.0–7.5)

## 2021-06-15 LAB — BLADDER SCAN AMB NON-IMAGING: Scan Result: 120

## 2021-06-15 NOTE — Patient Instructions (Signed)
Prostate Cancer °The prostate is a small gland that helps make semen. It is located below a man's bladder, in front of the rectum. Prostate cancer is when abnormal cells grow in this gland. °What are the causes? °The cause of this condition is not known. °What increases the risk? °Being age 75 or older. °Having a family history of prostate cancer. °Having a family history of cancer of the breasts or ovaries. °Having genes that are passed from parent to child (inherited). °Having Lynch syndrome. °African American men and men of African descent are diagnosed with prostate cancer at higher rates than other men. °What are the signs or symptoms? °Problems peeing (urinating). This may include: °A stream that is weak, or pee that stops and starts. °Trouble starting or stopping your pee. °Trouble emptying all of your pee. °Needing to pee more often, especially at night. °Blood in your pee or semen. °Pain in the: °Lower back. °Lower belly (abdomen). °Hips. °Trouble getting an erection. °Weakness or numbness in the legs or feet. °How is this treated? °Treatment for this condition depends on: °How much the cancer has spread. °Your age. °The kind of treatment you want. °Your health. °Treatments include: °Being watched. This is called observation. You will be tested from time to time, but you will not get treated. Tests are to make sure that the cancer is not growing. °Surgery. This may be done to: °Take out (remove) the prostate. °Freeze and kill cancer cells. °Radiation. This uses a strong beam of energy to kill cancer cells. °Chemotherapy. This uses medicines that stop cancer cells from increasing. This kills cancer cells and healthy cells. °Targeted therapy. This kills cancer cells only. Healthy cells are not affected. °Hormone treatment. This stops the body from making hormones that help the cancer cells grow. °Follow these instructions at home: °Lifestyle °Do not smoke or use any products that contain nicotine or tobacco.  If you need help quitting, ask your doctor. °Eat a healthy diet. °Treatment may affect your ability to have sex. If you have a partner, touch, hold, hug, and caress your partner to have intimate moments. °Get plenty of sleep. °Ask your doctor for help to find a support group for men with prostate cancer. °General instructions °Take over-the-counter and prescription medicines only as told by your doctor. °If you have to go to the hospital, let your cancer doctor (oncologist) know. °Keep all follow-up visits. °Where to find more information °American Cancer Society: www.cancer.org °American Society of Clinical Oncology: www.cancer.net °National Cancer Institute: www.cancer.gov °Contact a doctor if: °You have new or more trouble peeing. °You have new or more blood in your pee. °You have new or more pain in your hips, back, or chest. °Get help right away if: °You have weakness in your legs. °You lose feeling in your legs. °You cannot control your pee or your poop (stool). °You have chills or a fever. °Summary °The prostate is a male gland that helps make semen. °Prostate cancer is when abnormal cells grow in this gland. °Treatment includes doing surgery, using medicines, using strong beams of energy, or watching without treatment. °Ask your doctor for help to find a support group for men with prostate cancer. °Contact a doctor if you have problems peeing or have any new pain that you did not have before. °This information is not intended to replace advice given to you by your health care provider. Make sure you discuss any questions you have with your health care provider. °Document Revised: 11/17/2020 Document Reviewed: 11/17/2020 °Elsevier   Patient Education © 2022 [employer]. ° °

## 2021-06-15 NOTE — Telephone Encounter (Signed)
Clinical pharmacist to review Eliquis 

## 2021-06-15 NOTE — Progress Notes (Signed)
Surgical clearance sent to Dr. Harl Bowie.

## 2021-06-15 NOTE — Progress Notes (Signed)
 06/15/2021 1:42 PM   David Schwartz 09/24/1945 [id number]  Referring provider: Dettinger, Joshua A, MD [address] [place],  [place] [address]  Followup prostate cancer   HPI: David Schwartz is 75yo here for followup for prostate cancer. CT abd bone scan showed concerning lesions in his ribs but the PSMA PET scan was negative for metastatic disease. He met with radiation oncology at [facility] and he is seeing Dr. Morris tomorrow for consideration He is currently on terazosin. He has ED and takes tadalafil prn.    PMH: Past Medical History:  Diagnosis Date   Arthritis    Cancer (HCC)    Hyperlipidemia    Hypertension     Surgical History: Past Surgical History:  Procedure Laterality Date   CARDIOVERSION N/A 01/29/2020   Procedure: CARDIOVERSION;  Surgeon: McDowell, Samuel G, MD;  Location: [facility];  Service: Cardiovascular;  Laterality: N/A;   HERNIA REPAIR     KNEE ARTHROSCOPY      Home Medications:  Allergies as of 06/15/2021   No Known Allergies      Medication List        Accurate as of June 15, 2021  1:42 PM. If you have any questions, ask your nurse or doctor.          alendronate 70 MG tablet Commonly known as: FOSAMAX Take 1 tablet (70 mg total) by mouth once a week. Take with a full glass of water on an empty stomach.   benzonatate 100 MG capsule Commonly known as: TESSALON Take 1 capsule (100 mg total) by mouth 2 (two) times daily as needed for cough.   calcium carbonate 1500 (600 Ca) MG Tabs tablet Commonly known as: OSCAL Take by mouth daily with breakfast.   Eliquis 5 MG Tabs tablet Generic drug: apixaban Take 1 tablet by mouth twice daily   ketoconazole 2 % cream Commonly known as: NIZORAL Apply 1 application topically daily.   loperamide 2 MG tablet Commonly known as: Imodium A-D Take 1 tablet (2 mg total) by mouth 4 (four) times daily as needed for diarrhea or loose stools.   losartan 25 MG tablet Commonly known as: COZAAR Take 1  tablet by mouth once daily   metoprolol succinate 50 MG 24 hr tablet Commonly known as: TOPROL-XL Take 1 tablet by mouth once daily   ondansetron 4 MG tablet Commonly known as: Zofran Take 1 tablet (4 mg total) by mouth every 8 (eight) hours as needed for nausea or vomiting.   rosuvastatin 5 MG tablet Commonly known as: CRESTOR TAKE 1 TABLET BY MOUTH  DAILY AT 2 PM   tadalafil 20 MG tablet Commonly known as: CIALIS Take 1 tablet (20 mg total) by mouth as needed for erectile dysfunction.   terazosin 2 MG capsule Commonly known as: HYTRIN Take 1 capsule (2 mg total) by mouth at bedtime.   triamcinolone cream 0.1 % Commonly known as: KENALOG Apply 1 application topically 2 (two) times daily.   Vitamin D3 25 MCG (1000 UT) Caps Take by mouth.        Allergies: No Known Allergies  Family History: Family History  Problem Relation Age of Onset   Diabetes Mother    Hypertension Mother    Early death Father    Arthritis Maternal Grandmother    Hypertension Maternal Grandfather    Hypertension Paternal Grandmother    Cancer Paternal Grandfather        lung    Social History:  reports that he has   never smoked. He has never used smokeless tobacco. He reports current alcohol use. He reports that he does not use drugs.  ROS: All other review of systems were reviewed and are negative except what is noted above in HPI  Physical Exam: BP 132/66   Pulse 63   Constitutional:  Alert and oriented, No acute distress. HEENT: [place] AT, moist mucus membranes.  Trachea midline, no masses. Cardiovascular: No clubbing, cyanosis, or edema. Respiratory: Normal respiratory effort, no increased work of breathing. GI: Abdomen is soft, nontender, nondistended, no abdominal masses GU: No CVA tenderness.  Lymph: No cervical or inguinal lymphadenopathy. Skin: No rashes, bruises or suspicious lesions. Neurologic: Grossly intact, no focal deficits, moving all 4 extremities. Psychiatric: Normal  mood and affect.  Laboratory Data: Lab Results  Component Value Date   WBC 6.6 04/07/2021   HGB 14.6 04/07/2021   HCT 40.6 04/07/2021   MCV 91 04/07/2021   PLT 190 04/07/2021    Lab Results  Component Value Date   CREATININE 0.77 05/11/2021    No results found for: PSA  No results found for: TESTOSTERONE  No results found for: HGBA1C  Urinalysis    Component Value Date/Time   APPEARANCEUR Clear 02/23/2021 1045   GLUCOSEU Negative 02/23/2021 1045   BILIRUBINUR Negative 02/23/2021 1045   PROTEINUR Negative 02/23/2021 1045   NITRITE Negative 02/23/2021 1045   LEUKOCYTESUR Negative 02/23/2021 1045    Lab Results  Component Value Date   LABMICR Comment 02/23/2021    Pertinent Imaging: CT, bone scan, PSMA PET: Images reviewed and discussed with the patient No results found for this or any previous visit.  No results found for this or any previous visit.  No results found for this or any previous visit.  No results found for this or any previous visit.  No results found for this or any previous visit.  No results found for this or any previous visit.  No results found for this or any previous visit.  No results found for this or any previous visit.   Assessment & Plan:    1. Weak urinary stream -continue terazosin  2. Prostate cancer [facility]) We will scheduled for fiducial markers and SpacerOAR based on recommendations from Dr. Lynnette Caffey   3. Benign prostatic hyperplasia with urinary obstruction -continue terazosin - Urinalysis, Routine w reflex microscopic - BLADDER SCAN AMB NON-IMAGING  4. Erectile dysfunction due to arterial insufficiency -continue tadalafil 72m    No follow-ups on file.  PNicolette Bang MD  [facility]

## 2021-06-15 NOTE — H&P (View-Only) (Signed)
06/15/2021 1:42 PM   [name] Aug 18, 1946 [id number]  Referring provider: Dettinger, Fransisca Kaufmann, MD [place],  [place] [address]  Followup prostate cancer   HPI: Mr Eckerson is 75yo here for followup for prostate cancer. CT abd bone scan showed concerning lesions in his ribs but the PSMA PET scan was negative for metastatic disease. He met with radiation oncology at [facility] and he is seeing Dr. Lynnette Caffey tomorrow for consideration He is currently on terazosin. He has ED and takes tadalafil prn.    PMH: Past Medical History:  Diagnosis Date   Arthritis    Cancer ([place])    Hyperlipidemia    Hypertension     Surgical History: Past Surgical History:  Procedure Laterality Date   CARDIOVERSION N/A 01/29/2020   Procedure: CARDIOVERSION;  Surgeon: Satira Sark, MD;  Location: [facility];  Service: Cardiovascular;  Laterality: N/A;   HERNIA REPAIR     KNEE ARTHROSCOPY      Home Medications:  Allergies as of 06/15/2021   No Known Allergies      Medication List        Accurate as of June 15, 2021  1:42 PM. If you have any questions, ask your nurse or doctor.          alendronate 70 MG tablet Commonly known as: FOSAMAX Take 1 tablet (70 mg total) by mouth once a week. Take with a full glass of water on an empty stomach.   benzonatate 100 MG capsule Commonly known as: TESSALON Take 1 capsule (100 mg total) by mouth 2 (two) times daily as needed for cough.   calcium carbonate 1500 (600 Ca) MG Tabs tablet Commonly known as: OSCAL Take by mouth daily with breakfast.   Eliquis 5 MG Tabs tablet Generic drug: apixaban Take 1 tablet by mouth twice daily   ketoconazole 2 % cream Commonly known as: NIZORAL Apply 1 application topically daily.   loperamide 2 MG tablet Commonly known as: Imodium A-D Take 1 tablet (2 mg total) by mouth 4 (four) times daily as needed for diarrhea or loose stools.   losartan 25 MG tablet Commonly known as: COZAAR Take 1  tablet by mouth once daily   metoprolol succinate 50 MG 24 hr tablet Commonly known as: TOPROL-XL Take 1 tablet by mouth once daily   ondansetron 4 MG tablet Commonly known as: Zofran Take 1 tablet (4 mg total) by mouth every 8 (eight) hours as needed for nausea or vomiting.   rosuvastatin 5 MG tablet Commonly known as: CRESTOR TAKE 1 TABLET BY MOUTH  DAILY AT 2 PM   tadalafil 20 MG tablet Commonly known as: CIALIS Take 1 tablet (20 mg total) by mouth as needed for erectile dysfunction.   terazosin 2 MG capsule Commonly known as: HYTRIN Take 1 capsule (2 mg total) by mouth at bedtime.   triamcinolone cream 0.1 % Commonly known as: KENALOG Apply 1 application topically 2 (two) times daily.   Vitamin D3 25 MCG (1000 UT) Caps Take by mouth.        Allergies: No Known Allergies  Family History: Family History  Problem Relation Age of Onset   Diabetes Mother    Hypertension Mother    Early death Father    Arthritis Maternal Grandmother    Hypertension Maternal Grandfather    Hypertension Paternal Grandmother    Cancer Paternal Grandfather        lung    Social History:  reports that he has  never smoked. He has never used smokeless tobacco. He reports current alcohol use. He reports that he does not use drugs.  ROS: All other review of systems were reviewed and are negative except what is noted above in HPI  Physical Exam: BP 132/66   Pulse 63   Constitutional:  Alert and oriented, No acute distress. HEENT: [place] AT, moist mucus membranes.  Trachea midline, no masses. Cardiovascular: No clubbing, cyanosis, or edema. Respiratory: Normal respiratory effort, no increased work of breathing. GI: Abdomen is soft, nontender, nondistended, no abdominal masses GU: No CVA tenderness.  Lymph: No cervical or inguinal lymphadenopathy. Skin: No rashes, bruises or suspicious lesions. Neurologic: Grossly intact, no focal deficits, moving all 4 extremities. Psychiatric: Normal  mood and affect.  Laboratory Data: Lab Results  Component Value Date   WBC 6.6 04/07/2021   HGB 14.6 04/07/2021   HCT 40.6 04/07/2021   MCV 91 04/07/2021   PLT 190 04/07/2021    Lab Results  Component Value Date   CREATININE 0.77 05/11/2021    No results found for: PSA  No results found for: TESTOSTERONE  No results found for: HGBA1C  Urinalysis    Component Value Date/Time   APPEARANCEUR Clear 02/23/2021 1045   GLUCOSEU Negative 02/23/2021 1045   BILIRUBINUR Negative 02/23/2021 1045   PROTEINUR Negative 02/23/2021 1045   NITRITE Negative 02/23/2021 1045   LEUKOCYTESUR Negative 02/23/2021 1045    Lab Results  Component Value Date   LABMICR Comment 02/23/2021    Pertinent Imaging: CT, bone scan, PSMA PET: Images reviewed and discussed with the patient No results found for this or any previous visit.  No results found for this or any previous visit.  No results found for this or any previous visit.  No results found for this or any previous visit.  No results found for this or any previous visit.  No results found for this or any previous visit.  No results found for this or any previous visit.  No results found for this or any previous visit.   Assessment & Plan:    1. Weak urinary stream -continue terazosin  2. Prostate cancer [facility]) We will scheduled for fiducial markers and SpacerOAR based on recommendations from Dr. Lynnette Caffey   3. Benign prostatic hyperplasia with urinary obstruction -continue terazosin - Urinalysis, Routine w reflex microscopic - BLADDER SCAN AMB NON-IMAGING  4. Erectile dysfunction due to arterial insufficiency -continue tadalafil 72m    No follow-ups on file.  PNicolette Bang MD  [facility]

## 2021-06-15 NOTE — Telephone Encounter (Signed)
Patient with diagnosis of afib on Eliquis for anticoagulation.    Procedure:  Fiducial Markers and SpaceOAR for prostate Cancer Date of procedure: 06/23/21  CHA2DS2-VASc Score = 4   This indicates a 4.8% annual risk of stroke. The patient's score is based upon: CHF History: 1 HTN History: 1 Diabetes History: 0 Stroke History: 0 Vascular Disease History: 0 Age Score: 2 Gender Score: 0     CrCl 80 ml/min  Per office protocol, patient can hold Eliquis for 2 days prior to procedure.

## 2021-06-15 NOTE — Progress Notes (Signed)
post void residual=120  Urological Symptom Review  Patient is experiencing the following symptoms: Frequent urination Hard to postpone urination Get up at night to urinate Weak stream   Review of Systems  Gastrointestinal (upper)  : Negative for upper GI symptoms  Gastrointestinal (lower) : Negative for lower GI symptoms  Constitutional : Negative for symptoms  Skin: Negative for skin symptoms  Eyes: Negative for eye symptoms  Ear/Nose/Throat : Negative for Ear/Nose/Throat symptoms  Hematologic/Lymphatic: Negative for Hematologic/Lymphatic symptoms  Cardiovascular : Negative for cardiovascular symptoms  Respiratory : Negative for respiratory symptoms  Endocrine: Negative for endocrine symptoms  Musculoskeletal: Joint pain  Neurological: Negative for neurological symptoms  Psychologic: Negative for psychiatric symptoms

## 2021-06-15 NOTE — Telephone Encounter (Signed)
   [facility] Pre-operative Risk Assessment    Patient Name: David Schwartz  DOB: 25-Nov-1945 MRN: [id number]  HEARTCARE STAFF:  - IMPORTANT!!!!!! Under Visit Info/Reason for Call, type in Other and utilize the format Clearance MM/DD/YY or Clearance TBD. Do not use dashes or single digits. - Please review there is not already an duplicate clearance open for this procedure. - If request is for dental extraction, please clarify the # of teeth to be extracted. - If the patient is currently at the dentist's office, call Pre-Op Callback Staff (MA/nurse) to input urgent request.  - If the patient is not currently in the dentist office, please route to the Pre-Op pool.  Request for surgical clearance:  What type of surgery is being performed Fiducial Markers and SpaceOAR for prostate Cancer  When is this surgery scheduled? 06/23/21  What type of clearance is required (medical clearance vs. Pharmacy clearance to hold med vs. Both)? both  Are there any medications that need to be held prior to surgery and how long? Eliquis   Practice name and name of physician performing surgery? [facility]  What is the office phone number? [phone]    7.   What is the office fax number? [phone]   8.   Anesthesia type (None, local, MAC, general) ? Not specified   Jannet Askew 06/15/2021, 2:28 PM  _________________________________________________________________   (provider comments below)    Letter by Dorisann Frames, RN on 06/15/2021      [address] [place], [place]  [address] Phone:  [phone]   Fax:  [phone]                                                                                                        06/15/2021     Patient: David Schwartz          DOB Jan 14, 1946 MRN [id number]     Dr. Harl Bowie,   The above stated patient is having Fiducial Markers and SpaceOAR for prostate cancer procedure  performed at [facility] on 06/23/2021. Dr. Nicolette Bang will perform this procedure using general anesthesia . Dr.McKenzie  is asking for cardiology clearance and for approval for patient to hold Eliquis 2 days and 2 days after before the scheduled procedure.  Please send back supporting documentation of this approval request.   Thank you, Estill Bamberg RN Dr. Nicolette Bang

## 2021-06-16 ENCOUNTER — Telehealth: Payer: Self-pay

## 2021-06-16 DIAGNOSIS — Z7901 Long term (current) use of anticoagulants: Secondary | ICD-10-CM | POA: Diagnosis not present

## 2021-06-16 DIAGNOSIS — I10 Essential (primary) hypertension: Secondary | ICD-10-CM | POA: Diagnosis not present

## 2021-06-16 DIAGNOSIS — Z79899 Other long term (current) drug therapy: Secondary | ICD-10-CM | POA: Diagnosis not present

## 2021-06-16 DIAGNOSIS — Z7983 Long term (current) use of bisphosphonates: Secondary | ICD-10-CM | POA: Diagnosis not present

## 2021-06-16 DIAGNOSIS — E785 Hyperlipidemia, unspecified: Secondary | ICD-10-CM | POA: Diagnosis not present

## 2021-06-16 NOTE — Telephone Encounter (Signed)
Pt called and wanted to know went to stop his blood thinner Eliquis , pt her cardiologist he  will need to stop this 2 days prior to his producer. I advised pt that the Dr. Cassandria Anger will inform him know when he can  re start his medication.

## 2021-06-16 NOTE — Telephone Encounter (Signed)
   Primary Cardiologist: Carlyle Dolly, MD  Chart reviewed as part of pre-operative protocol coverage. Given past medical history and time since last visit, based on ACC/AHA guidelines, Xzayvier Fagin would be at acceptable risk for the planned procedure without further cardiovascular testing.   Patient with diagnosis of afib on Eliquis for anticoagulation.     Procedure:  Fiducial Markers and SpaceOAR for prostate Cancer Date of procedure: 06/23/21   CHA2DS2-VASc Score = 4   This indicates a 4.8% annual risk of stroke. The patient's score is based upon: CHF History: 1 HTN History: 1 Diabetes History: 0 Stroke History: 0 Vascular Disease History: 0 Age Score: 2 Gender Score: 0     CrCl 80 ml/min   Per office protocol, patient can hold Eliquis for 2 days prior to procedure.  I will route this recommendation to the requesting party via Epic fax function and remove from pre-op pool.  Please call with questions.  Jossie Ng. Yakir Wenke NP-C    06/16/2021, 9:41 AM [address] [place] [address] Office [phone] Fax [phone]

## 2021-06-17 NOTE — Patient Instructions (Signed)
Your procedure is scheduled on: 06/23/2021  Report to [facility] at    7:30 AM.  Call this number if you have problems the morning of surgery: [phone]   Remember:   Do not Eat or Drink after midnight         No Smoking the morning of surgery  :  Take these medicines the morning of surgery with A SIP OF WATER: Metoprolol              Hold Eliquis for 2 days as instructed by cardiology   Do not wear jewelry, make-up or nail polish.  Do not wear lotions, powders, or perfumes. You may wear deodorant.  Do not shave 48 hours prior to surgery. Men may shave face and neck.  Do not bring valuables to the hospital.  Contacts, dentures or bridgework may not be worn into surgery.  Leave suitcase in the car. After surgery it may be brought to your room.  For patients admitted to the hospital, checkout time is 11:00 AM the day of discharge.   Patients discharged the day of surgery will not be allowed to drive home.    Special Instructions: Shower using CHG night before surgery and shower the day of surgery use CHG.  Use special wash - you have one bottle of CHG for all showers.  You should use approximately 1/2 of the bottle for each shower.  How to Use Chlorhexidine for Bathing Chlorhexidine gluconate (CHG) is a germ-killing (antiseptic) solution that is used to clean the skin. It can get rid of the bacteria that normally live on the skin and can keep them away for about 24 hours. To clean your skin with CHG, you may be given: A CHG solution to use in the shower or as part of a sponge bath. A prepackaged cloth that contains CHG. Cleaning your skin with CHG may help lower the risk for infection: While you are staying in the intensive care unit of the hospital. If you have a vascular access, such as a central line, to provide short-term or long-term access to your veins. If you have a catheter to drain urine from your bladder. If you are on a ventilator. A ventilator is a machine  that helps you breathe by moving air in and out of your lungs. After surgery. What are the risks? Risks of using CHG include: A skin reaction. Hearing loss, if CHG gets in your ears and you have a perforated eardrum. Eye injury, if CHG gets in your eyes and is not rinsed out. The CHG product catching fire. Make sure that you avoid smoking and flames after applying CHG to your skin. Do not use CHG: If you have a chlorhexidine allergy or have previously reacted to chlorhexidine. On babies younger than 13 months of age. How to use CHG solution Use CHG only as told by your health care provider, and follow the instructions on the label. Use the full amount of CHG as directed. Usually, this is one bottle. During a shower Follow these steps when using CHG solution during a shower (unless your health care provider gives you different instructions): Start the shower. Use your normal soap and shampoo to wash your face and hair. Turn off the shower or move out of the shower stream. Pour the CHG onto a clean washcloth. Do not use any type of brush or rough-edged sponge. Starting at your neck, lather your body down to your toes. Make sure you follow these instructions:  If you will be having surgery, pay special attention to the part of your body where you will be having surgery. Scrub this area for at least 1 minute. Do not use CHG on your head or face. If the solution gets into your ears or eyes, rinse them well with water. Avoid your genital area. Avoid any areas of skin that have broken skin, cuts, or scrapes. Scrub your back and under your arms. Make sure to wash skin folds. Let the lather sit on your skin for 1-2 minutes or as long as told by your health care provider. Thoroughly rinse your entire body in the shower. Make sure that all body creases and crevices are rinsed well. Dry off with a clean towel. Do not put any substances on your body afterward--such as powder, lotion, or perfume--unless  you are told to do so by your health care provider. Only use lotions that are recommended by the manufacturer. Put on clean clothes or pajamas. If it is the night before your surgery, sleep in clean sheets.  During a sponge bath Follow these steps when using CHG solution during a sponge bath (unless your health care provider gives you different instructions): Use your normal soap and shampoo to wash your face and hair. Pour the CHG onto a clean washcloth. Starting at your neck, lather your body down to your toes. Make sure you follow these instructions: If you will be having surgery, pay special attention to the part of your body where you will be having surgery. Scrub this area for at least 1 minute. Do not use CHG on your head or face. If the solution gets into your ears or eyes, rinse them well with water. Avoid your genital area. Avoid any areas of skin that have broken skin, cuts, or scrapes. Scrub your back and under your arms. Make sure to wash skin folds. Let the lather sit on your skin for 1-2 minutes or as long as told by your health care provider. Using a different clean, wet washcloth, thoroughly rinse your entire body. Make sure that all body creases and crevices are rinsed well. Dry off with a clean towel. Do not put any substances on your body afterward--such as powder, lotion, or perfume--unless you are told to do so by your health care provider. Only use lotions that are recommended by the manufacturer. Put on clean clothes or pajamas. If it is the night before your surgery, sleep in clean sheets. How to use CHG prepackaged cloths Only use CHG cloths as told by your health care provider, and follow the instructions on the label. Use the CHG cloth on clean, dry skin. Do not use the CHG cloth on your head or face unless your health care provider tells you to. When washing with the CHG cloth: Avoid your genital area. Avoid any areas of skin that have broken skin, cuts, or  scrapes. Before surgery Follow these steps when using a CHG cloth to clean before surgery (unless your health care provider gives you different instructions): Using the CHG cloth, vigorously scrub the part of your body where you will be having surgery. Scrub using a back-and-forth motion for 3 minutes. The area on your body should be completely wet with CHG when you are done scrubbing. Do not rinse. Discard the cloth and let the area air-dry. Do not put any substances on the area afterward, such as powder, lotion, or perfume. Put on clean clothes or pajamas. If it is the night before your surgery, sleep  in clean sheets.  For general bathing Follow these steps when using CHG cloths for general bathing (unless your health care provider gives you different instructions). Use a separate CHG cloth for each area of your body. Make sure you wash between any folds of skin and between your fingers and toes. Wash your body in the following order, switching to a new cloth after each step: The front of your neck, shoulders, and chest. Both of your arms, under your arms, and your hands. Your stomach and groin area, avoiding the genitals. Your right leg and foot. Your left leg and foot. The back of your neck, your back, and your buttocks. Do not rinse. Discard the cloth and let the area air-dry. Do not put any substances on your body afterward--such as powder, lotion, or perfume--unless you are told to do so by your health care provider. Only use lotions that are recommended by the manufacturer. Put on clean clothes or pajamas. Contact a health care provider if: Your skin gets irritated after scrubbing. You have questions about using your solution or cloth. You swallow any chlorhexidine. Call your local poison control center (1-256 630 2605 in the [place] Get help right away if: Your eyes itch badly, or they become very red or swollen. Your skin itches badly and is red or swollen. Your hearing  changes. You have trouble seeing. You have swelling or tingling in your mouth or throat. You have trouble breathing. These symptoms may represent a serious problem that is an emergency. Do not wait to see if the symptoms will go away. Get medical help right away. Call your local emergency services (911 in the [place] Do not drive yourself to the hospital. Summary Chlorhexidine gluconate (CHG) is a germ-killing (antiseptic) solution that is used to clean the skin. Cleaning your skin with CHG may help to lower your risk for infection. You may be given CHG to use for bathing. It may be in a bottle or in a prepackaged cloth to use on your skin. Carefully follow your health care provider's instructions and the instructions on the product label. Do not use CHG if you have a chlorhexidine allergy. Contact your health care provider if your skin gets irritated after scrubbing. This information is not intended to replace advice given to you by your health care provider. Make sure you discuss any questions you have with your health care provider. Document Revised: 11/01/2020 Document Reviewed: 11/01/2020 Elsevier Patient Education  2022 [place]. Transrectal Ultrasound-Guided Prostate Gold Seed Placement, Care After The following information offers guidance on how to care for yourself after your procedure. Your health care provider may also give you more specific instructions. If you have problems or questions, contact your health care provider. What can I expect after the procedure? After the procedure, it is common to have: Light bleeding from the rectum. Bruising or tenderness in the area behind the scrotum (perineum), if the needle was put into your prostate through this area. Small amounts of blood in your urine. This should only last for a few days. Light brown or red semen. This may last for a couple of weeks. Follow these instructions at home: Medicines  Take over-the-counter and  prescription medicines only as told by your health care provider. If you were prescribed an antibiotic, take it as told by your health care provider. Do not stop taking the antibiotic early, even if you start to feel better. Ask your health care provider if the medicine prescribed to you requires you to avoid driving  or using machinery. Eating and drinking Follow instructions from your health care provider about eating or drinking restrictions. Drink enough fluid to keep your urine pale yellow. Managing pain and swelling If directed, put ice on the affected area. To do this: Put ice in a plastic bag. Place a towel between your skin and the bag. Leave the ice on for 20 minutes, 2-3 times a day. Be sure to remove the ice if your skin turns bright red. If you cannot feel pain, heat, or cold, you have a greater risk of damage to the area. Try not to sit directly on the area behind the scrotum. A soft cushion can help with discomfort. Activity If you were given a sedative during the procedure, it can affect you for several hours. Do not drive or operate machinery until your health care provider says that it is safe. Return to your normal activities as told by your health care provider. Ask your health care provider what activities are safe for you. Follow instructions from your health care provider about when it is safe for you to engage in sexual activity. General instructions Plan to have a responsible adult care for you for the time you are told after you leave the hospital or clinic. Do not take baths, swim, or use a hot tub until your health care provider approves. Ask your health care provider if you may take showers. You may only be allowed to take sponge baths. Keep all follow-up visits. Contact a health care provider if: You have a fever or chills. You have more blood in your urine. You have blood in your urine for more than 2-3 days after the procedure. You have trouble passing urine or  having a bowel movement. You have pain or burning when urinating. You have nausea or you vomit. Get help right away if: You have severe pain that does not get better with medicine. Your urine is bright red. You cannot urinate. You have rectal bleeding that gets worse. You have shortness of breath. Summary After the procedure, you may have blood in your urine and light bleeding from the rectum. Return to your normal activities as told by your health care provider. Ask your health care provider what activities are safe for you. Take over-the-counter and prescription medicines only as told by your health care provider. Contact your health care provider right away if your urine is bright red or you cannot pass urine. This information is not intended to replace advice given to you by your health care provider. Make sure you discuss any questions you have with your health care provider. Document Revised: 11/17/2020 Document Reviewed: 11/17/2020 Elsevier Patient Education  [place] Anesthesia, Adult, Care After This sheet gives you information about how to care for yourself after your procedure. Your health care provider may also give you more specific instructions. If you have problems or questions, contact your health care provider. What can I expect after the procedure? After the procedure, the following side effects are common: Pain or discomfort at the IV site. Nausea. Vomiting. Sore throat. Trouble concentrating. Feeling cold or chills. Feeling weak or tired. Sleepiness and fatigue. Soreness and body aches. These side effects can affect parts of the body that were not involved in surgery. Follow these instructions at home: For the time period you were told by your health care provider:  Rest. Do not participate in activities where you could fall or become injured. Do not drive or use machinery. Do not drink  alcohol. Do not take sleeping pills or medicines that  cause drowsiness. Do not make important decisions or sign legal documents. Do not take care of children on your own. Eating and drinking Follow any instructions from your health care provider about eating or drinking restrictions. When you feel hungry, start by eating small amounts of foods that are soft and easy to digest (bland), such as toast. Gradually return to your regular diet. Drink enough fluid to keep your urine pale yellow. If you vomit, rehydrate by drinking water, juice, or clear broth. General instructions If you have sleep apnea, surgery and certain medicines can increase your risk for breathing problems. Follow instructions from your health care provider about wearing your sleep device: Anytime you are sleeping, including during daytime naps. While taking prescription pain medicines, sleeping medicines, or medicines that make you drowsy. Have a responsible adult stay with you for the time you are told. It is important to have someone help care for you until you are awake and alert. Return to your normal activities as told by your health care provider. Ask your health care provider what activities are safe for you. Take over-the-counter and prescription medicines only as told by your health care provider. If you smoke, do not smoke without supervision. Keep all follow-up visits as told by your health care provider. This is important. Contact a health care provider if: You have nausea or vomiting that does not get better with medicine. You cannot eat or drink without vomiting. You have pain that does not get better with medicine. You are unable to pass urine. You develop a skin rash. You have a fever. You have redness around your IV site that gets worse. Get help right away if: You have difficulty breathing. You have chest pain. You have blood in your urine or stool, or you vomit blood. Summary After the procedure, it is common to have a sore throat or nausea. It is also  common to feel tired. Have a responsible adult stay with you for the time you are told. It is important to have someone help care for you until you are awake and alert. When you feel hungry, start by eating small amounts of foods that are soft and easy to digest (bland), such as toast. Gradually return to your regular diet. Drink enough fluid to keep your urine pale yellow. Return to your normal activities as told by your health care provider. Ask your health care provider what activities are safe for you. This information is not intended to replace advice given to you by your health care provider. Make sure you discuss any questions you have with your health care provider. Document Revised: 05/06/2020 Document Reviewed: 12/04/2019 Elsevier Patient Education  2022 [employer].

## 2021-06-21 ENCOUNTER — Encounter (HOSPITAL_COMMUNITY)
Admission: RE | Admit: 2021-06-21 | Discharge: 2021-06-21 | Disposition: A | Discharge status: Home / Self Care | Payer: Medicare Other | Source: Ambulatory Visit | Attending: Urology | Admitting: Urology

## 2021-06-21 ENCOUNTER — Encounter (HOSPITAL_COMMUNITY): Payer: Self-pay

## 2021-06-21 ENCOUNTER — Other Ambulatory Visit: Payer: Self-pay

## 2021-06-21 HISTORY — DX: Cardiac arrhythmia, unspecified: I49.9

## 2021-06-21 HISTORY — DX: Unspecified atrial fibrillation: I48.91

## 2021-06-23 ENCOUNTER — Ambulatory Visit (HOSPITAL_COMMUNITY)
Admission: RE | Admit: 2021-06-23 | Discharge: 2021-06-23 | Disposition: A | Discharge status: Home / Self Care | Payer: Medicare Other | Source: Ambulatory Visit | Attending: Urology | Admitting: Urology

## 2021-06-23 ENCOUNTER — Ambulatory Visit (HOSPITAL_COMMUNITY): Payer: Medicare Other

## 2021-06-23 ENCOUNTER — Encounter (HOSPITAL_COMMUNITY)
Admission: RE | Disposition: A | Discharge status: Home / Self Care | Payer: Self-pay | Source: Ambulatory Visit | Attending: Urology

## 2021-06-23 ENCOUNTER — Ambulatory Visit (HOSPITAL_COMMUNITY): Payer: Medicare Other | Admitting: Anesthesiology

## 2021-06-23 ENCOUNTER — Encounter (HOSPITAL_COMMUNITY): Payer: Self-pay | Admitting: Urology

## 2021-06-23 DIAGNOSIS — N138 Other obstructive and reflux uropathy: Secondary | ICD-10-CM | POA: Diagnosis not present

## 2021-06-23 DIAGNOSIS — E785 Hyperlipidemia, unspecified: Secondary | ICD-10-CM | POA: Diagnosis not present

## 2021-06-23 DIAGNOSIS — N529 Male erectile dysfunction, unspecified: Secondary | ICD-10-CM | POA: Insufficient documentation

## 2021-06-23 DIAGNOSIS — I10 Essential (primary) hypertension: Secondary | ICD-10-CM | POA: Diagnosis not present

## 2021-06-23 DIAGNOSIS — I4819 Other persistent atrial fibrillation: Secondary | ICD-10-CM | POA: Diagnosis not present

## 2021-06-23 DIAGNOSIS — R972 Elevated prostate specific antigen [PSA]: Secondary | ICD-10-CM

## 2021-06-23 DIAGNOSIS — C61 Malignant neoplasm of prostate: Secondary | ICD-10-CM | POA: Diagnosis not present

## 2021-06-23 DIAGNOSIS — N401 Enlarged prostate with lower urinary tract symptoms: Secondary | ICD-10-CM | POA: Diagnosis not present

## 2021-06-23 HISTORY — PX: GOLD SEED IMPLANT: SHX6343

## 2021-06-23 HISTORY — PX: SPACE OAR INSTILLATION: SHX6769

## 2021-06-23 SURGERY — INJECTION, HYDROGEL SPACER
Anesthesia: General

## 2021-06-23 MED ORDER — LACTATED RINGERS IV SOLN
INTRAVENOUS | Status: DC
  Administered 2021-06-23: 1000 mL via INTRAVENOUS

## 2021-06-23 MED ORDER — LIDOCAINE 2% (20 MG/ML) 5 ML SYRINGE
INTRAMUSCULAR | Status: DC | PRN
  Administered 2021-06-23: 80 mg via INTRAVENOUS

## 2021-06-23 MED ORDER — CEFAZOLIN SODIUM-DEXTROSE 2-4 GM/100ML-% IV SOLN
2.0000 g | INTRAVENOUS | Status: AC
  Administered 2021-06-23: 2 g via INTRAVENOUS

## 2021-06-23 MED ORDER — PROPOFOL 10 MG/ML IV BOLUS
INTRAVENOUS | Status: DC | PRN
  Administered 2021-06-23: 150 mg via INTRAVENOUS

## 2021-06-23 MED ORDER — ONDANSETRON HCL 4 MG/2ML IJ SOLN
INTRAMUSCULAR | Status: AC
  Filled 2021-06-23: qty 4

## 2021-06-23 MED ORDER — CHLORHEXIDINE GLUCONATE 0.12 % MT SOLN
OROMUCOSAL | Status: AC
  Filled 2021-06-23: qty 15

## 2021-06-23 MED ORDER — ONDANSETRON HCL 4 MG/2ML IJ SOLN: 4.0000 mg | Freq: Once | INTRAMUSCULAR | Status: DC | PRN

## 2021-06-23 MED ORDER — PHENYLEPHRINE 40 MCG/ML (10ML) SYRINGE FOR IV PUSH (FOR BLOOD PRESSURE SUPPORT)
PREFILLED_SYRINGE | INTRAVENOUS | Status: DC | PRN
  Administered 2021-06-23: 80 ug via INTRAVENOUS

## 2021-06-23 MED ORDER — FENTANYL CITRATE (PF) 100 MCG/2ML IJ SOLN
INTRAMUSCULAR | Status: AC
  Filled 2021-06-23: qty 2

## 2021-06-23 MED ORDER — PROPOFOL 10 MG/ML IV BOLUS
INTRAVENOUS | Status: AC
  Filled 2021-06-23: qty 20

## 2021-06-23 MED ORDER — LIDOCAINE HCL (PF) 2 % IJ SOLN
INTRAMUSCULAR | Status: AC
  Filled 2021-06-23: qty 5

## 2021-06-23 MED ORDER — CHLORHEXIDINE GLUCONATE 0.12 % MT SOLN
15.0000 mL | Freq: Once | OROMUCOSAL | Status: AC
  Administered 2021-06-23: 15 mL via OROMUCOSAL

## 2021-06-23 MED ORDER — 0.9 % SODIUM CHLORIDE (POUR BTL) OPTIME
TOPICAL | Status: DC | PRN
  Administered 2021-06-23: 1000 mL

## 2021-06-23 MED ORDER — TRAMADOL HCL 50 MG PO TABS: 50.0000 mg | ORAL_TABLET | Freq: Four times a day (QID) | ORAL | 0 refills | Status: DC | PRN

## 2021-06-23 MED ORDER — FENTANYL CITRATE PF 50 MCG/ML IJ SOSY: 25.0000 ug | PREFILLED_SYRINGE | INTRAMUSCULAR | Status: DC | PRN

## 2021-06-23 MED ORDER — ORAL CARE MOUTH RINSE: 15.0000 mL | Freq: Once | OROMUCOSAL | Status: AC

## 2021-06-23 MED ORDER — FENTANYL CITRATE (PF) 100 MCG/2ML IJ SOLN
INTRAMUSCULAR | Status: DC | PRN
  Administered 2021-06-23 (×2): 25 ug via INTRAVENOUS

## 2021-06-23 MED ORDER — DEXAMETHASONE SODIUM PHOSPHATE 10 MG/ML IJ SOLN
INTRAMUSCULAR | Status: DC | PRN
  Administered 2021-06-23: 10 mg via INTRAVENOUS

## 2021-06-23 MED ORDER — EPHEDRINE SULFATE-NACL 50-0.9 MG/10ML-% IV SOSY
PREFILLED_SYRINGE | INTRAVENOUS | Status: DC | PRN
  Administered 2021-06-23: 10 mg via INTRAVENOUS
  Administered 2021-06-23: 5 mg via INTRAVENOUS
  Administered 2021-06-23: 10 mg via INTRAVENOUS

## 2021-06-23 MED ORDER — CEFAZOLIN SODIUM-DEXTROSE 2-4 GM/100ML-% IV SOLN
INTRAVENOUS | Status: AC
  Filled 2021-06-23: qty 100

## 2021-06-23 SURGICAL SUPPLY — 24 items
COVER BACK TABLE 60X90IN (DRAPES) ×2 IMPLANT
DRAPE LEGGINS SURG 28X43 STRL (DRAPES) ×2 IMPLANT
DRAPE SURG 17X23 STRL (DRAPES) ×2 IMPLANT
DRSG TEGADERM 8X12 (GAUZE/BANDAGES/DRESSINGS) ×2 IMPLANT
GAUZE SPONGE 4X4 12PLY STRL (GAUZE/BANDAGES/DRESSINGS) ×2 IMPLANT
GLOVE SRG 8 PF TXTR STRL LF DI (GLOVE) ×1 IMPLANT
GLOVE SURG POLYISO LF SZ8 (GLOVE) ×2 IMPLANT
GLOVE SURG UNDER POLY LF SZ7 (GLOVE) ×4 IMPLANT
GLOVE SURG UNDER POLY LF SZ7.5 (GLOVE) ×2 IMPLANT
GLOVE SURG UNDER POLY LF SZ8 (GLOVE) ×2
GOWN STRL REUS W/TWL LRG LVL3 (GOWN DISPOSABLE) ×2 IMPLANT
GOWN STRL REUS W/TWL XL LVL3 (GOWN DISPOSABLE) ×2 IMPLANT
IMPL SPACEOAR SYSTEM 10ML (Spacer) ×1 IMPLANT
IMPLANT SPACEOAR SYSTEM 10ML (Spacer) ×2 IMPLANT
KIT TURNOVER CYSTO (KITS) ×2 IMPLANT
MARKER GOLD PRELOAD 1.2X3 (Urological Implant) ×1 IMPLANT
NS IRRIG 1000ML POUR BTL (IV SOLUTION) ×2 IMPLANT
PAD ARMBOARD 7.5X6 YLW CONV (MISCELLANEOUS) ×4 IMPLANT
SEED GOLD PRELOAD 1.2X3 (Urological Implant) ×2 IMPLANT
SURGILUBE 2OZ TUBE FLIPTOP (MISCELLANEOUS) ×2 IMPLANT
SYR CONTROL 10ML LL (SYRINGE) ×2 IMPLANT
TOWEL NATURAL 4PK STERILE (DISPOSABLE) ×2 IMPLANT
UNDERPAD 30X36 HEAVY ABSORB (UNDERPADS AND DIAPERS) ×2 IMPLANT
WATER STERILE IRR 500ML POUR (IV SOLUTION) ×2 IMPLANT

## 2021-06-23 NOTE — Anesthesia Procedure Notes (Signed)
Procedure Name: LMA Insertion Date/Time: 06/23/2021 10:15 AM Performed by: Myna Bright, CRNA Pre-anesthesia Checklist: Patient identified, Emergency Drugs available, Suction available and Patient being monitored Patient Re-evaluated:Patient Re-evaluated prior to induction Oxygen Delivery Method: Circle system utilized Preoxygenation: Pre-oxygenation with 100% oxygen Induction Type: IV induction Ventilation: Mask ventilation without difficulty LMA: LMA inserted LMA Size: 4.0 Number of attempts: 1 Placement Confirmation: positive ETCO2 and breath sounds checked- equal and bilateral Tube secured with: Tape Dental Injury: Teeth and Oropharynx as per pre-operative assessment

## 2021-06-23 NOTE — Interval H&P Note (Signed)
History and Physical Interval Note:  06/23/2021 9:40 AM  David Schwartz  has presented today for surgery, with the diagnosis of prostate cancer.  The various methods of treatment have been discussed with the patient and family. After consideration of risks, benefits and other options for treatment, the patient has consented to  Procedure(s): SPACE OAR INSTILLATION (N/A) GOLD SEED IMPLANT (N/A) as a surgical intervention.  The patient's history has been reviewed, patient examined, no change in status, stable for surgery.  I have reviewed the patient's chart and labs.  Questions were answered to the patient's satisfaction.     Nicolette Bang

## 2021-06-23 NOTE — Op Note (Addendum)
PRE-OPERATIVE DIAGNOSIS:  Adenocarcinoma of the prostate  POST-OPERATIVE DIAGNOSIS:  Same  PROCEDURE: 1. Prostate Ultrasound 2. Placement of fiducial marks 3. Placement of SpaceOAR  SURGEON:  Surgeon(s): Nicolette Bang, MD  ANESTHESIA:  General  EBL:  Minimal  DRAINS: none  FINDINGS: 41cc prostate on [place]  INDICATION: David Schwartz is a 75 year old with a history of T1c prostate cancer who is scheduled to undergo IMRT. He wishes to have fiducial markers and SpaceOAR placed prior to IMRT to decrease rectal toxicity.  Description of procedure: After informed consent the patient was brought to the major OR, placed on the table and administered general anesthesia. He was then moved to the modified lithotomy position with his perineum perpendicular to the floor. His perineum and genitalia were then sterilely prepped. An official timeout was then performed. The transrectal ultrasound probe was placed in the rectum and affixed to the stand. He was then sterilely draped.  A transrectal ultrasound of the prostate was performed.  Lidocaine  was not  instilled using ultrasound guidance into the junction of each seminal vesicle of the prostate.  3 Gold markers were placed into the prostate using the standard template and ultrasound guidance.  Accurate placement of the markers was confirmed.  We then proceeded to mix the SpaceOAR using the kit supplied from the manufacturer. Once this was complete we placed a sinal needle into the perirectal fat between the rectum and the prostate. Once this was accomplished we injected 2cc of normal saline to hydrodissect the plain. We then instilled the the SpaceOAR through the spinal needle and noted good distribution in the perirectal fat.   The patient was awakened and taken to recovery room in stable and satisfactory condition. He tolerated procedure well and there were no intraoperative complications.  CONDITION: Stable, extubated, transferred to PACU  PLAN:  The patient is to be discharged home and he will start IMRT in the next 2-3 weeks

## 2021-06-23 NOTE — Anesthesia Postprocedure Evaluation (Signed)
Anesthesia Post Note  Patient: David Schwartz  Procedure(s) Performed: SPACE OAR INSTILLATION GOLD SEED IMPLANT  Patient location during evaluation: PACU Anesthesia Type: General Level of consciousness: awake and alert and oriented Pain management: pain level controlled Vital Signs Assessment: post-procedure vital signs reviewed and stable Respiratory status: spontaneous breathing, nonlabored ventilation and respiratory function stable Cardiovascular status: blood pressure returned to baseline and stable Postop Assessment: no apparent nausea or vomiting Anesthetic complications: no   No notable events documented.   Last Vitals:  Vitals:   06/23/21 1100 06/23/21 1115  BP: 121/75 123/77  Pulse: 73 72  Resp: 15 16  Temp:  36.9 C  SpO2: 96% 97%    Last Pain:  Vitals:   06/23/21 1115  TempSrc: Oral  PainSc: 4                  David Schwartz

## 2021-06-23 NOTE — Anesthesia Preprocedure Evaluation (Addendum)
Anesthesia Evaluation  Patient identified by MRN, date of birth, ID band Patient awake    Reviewed: Allergy & Precautions, NPO status , Patient's Chart, lab work & pertinent test results, reviewed documented beta blocker date and time   Airway Mallampati: II  TM Distance: >3 FB Neck ROM: Full    Dental  (+) Dental Advisory Given, Chipped, Missing,  Multiple chipped teeth:   Pulmonary neg pulmonary ROS,    Pulmonary exam normal breath sounds clear to auscultation       Cardiovascular Exercise Tolerance: Good hypertension, Pt. on medications and Pt. on home beta blockers Normal cardiovascular exam+ dysrhythmias Atrial Fibrillation  Rhythm:Regular Rate:Normal     Neuro/Psych negative neurological ROS  negative psych ROS   GI/Hepatic negative GI ROS, Neg liver ROS,   Endo/Other  negative endocrine ROS  Renal/GU negative Renal ROS   PROSTATE CANCER    Musculoskeletal  (+) Arthritis ,   Abdominal   Peds  Hematology negative hematology ROS (+)   Anesthesia Other Findings Polymyalgia rheumatica   Reproductive/Obstetrics                           Anesthesia Physical Anesthesia Plan  ASA: 3  Anesthesia Plan: General   Post-op Pain Management:    Induction: Intravenous  PONV Risk Score and Plan: 4 or greater and Ondansetron  Airway Management Planned: LMA  Additional Equipment:   Intra-op Plan:   Post-operative Plan: Extubation in OR  Informed Consent: I have reviewed the patients History and Physical, chart, labs and discussed the procedure including the risks, benefits and alternatives for the proposed anesthesia with the patient or authorized representative who has indicated his/her understanding and acceptance.     Dental advisory given  Plan Discussed with: CRNA and Surgeon  Anesthesia Plan Comments:        Anesthesia Quick Evaluation

## 2021-06-23 NOTE — Transfer of Care (Signed)
Immediate Anesthesia Transfer of Care Note  Patient: David Schwartz  Procedure(s) Performed: SPACE OAR INSTILLATION GOLD SEED IMPLANT  Patient Location: PACU  Anesthesia Type:General  Level of Consciousness: awake, alert , oriented and patient cooperative  Airway & Oxygen Therapy: Patient Spontanous Breathing and Patient connected to nasal cannula oxygen  Post-op Assessment: Report given to RN, Post -op Vital signs reviewed and stable and Patient moving all extremities  Post vital signs: Reviewed and stable  Last Vitals:  Vitals Value Taken Time  BP 121/73 06/23/21 1053  Temp 36.4 C 06/23/21 1053  Pulse 77 06/23/21 1053  Resp 19 06/23/21 1053  SpO2 97 % 06/23/21 1053    Last Pain:  Vitals:   06/23/21 1053  TempSrc:   PainSc: 0-No pain      Patients Stated Pain Goal: 7 ([id number])  Complications: No notable events documented.

## 2021-06-24 ENCOUNTER — Encounter (HOSPITAL_COMMUNITY): Payer: Self-pay | Admitting: Urology

## 2021-06-28 ENCOUNTER — Telehealth: Payer: Self-pay

## 2021-06-28 DIAGNOSIS — Z7901 Long term (current) use of anticoagulants: Secondary | ICD-10-CM | POA: Diagnosis not present

## 2021-06-28 DIAGNOSIS — Z79899 Other long term (current) drug therapy: Secondary | ICD-10-CM | POA: Diagnosis not present

## 2021-06-28 DIAGNOSIS — I10 Essential (primary) hypertension: Secondary | ICD-10-CM | POA: Diagnosis not present

## 2021-06-28 DIAGNOSIS — Z7983 Long term (current) use of bisphosphonates: Secondary | ICD-10-CM | POA: Diagnosis not present

## 2021-06-28 DIAGNOSIS — E785 Hyperlipidemia, unspecified: Secondary | ICD-10-CM | POA: Diagnosis not present

## 2021-06-28 NOTE — Telephone Encounter (Signed)
Received call from [facility] to clarify wether patient has received ADT. After reviewing with Dr. Alyson Ingles patient is not doing ADT therapy. Nurse [place] made aware.

## 2021-06-28 NOTE — Telephone Encounter (Signed)
Was this an old message? I addressed this with Pam and the nurse [place] this morning.

## 2021-06-28 NOTE — Telephone Encounter (Signed)
Call Jinny Blossom or Pam w/ [facility] once Injection appointments are made.  Call back:  [phone]    Thanks, Helene Kelp

## 2021-07-06 ENCOUNTER — Other Ambulatory Visit: Payer: Self-pay

## 2021-07-06 ENCOUNTER — Encounter: Payer: Self-pay | Admitting: Urology

## 2021-07-06 ENCOUNTER — Ambulatory Visit: Payer: Medicare Other | Admitting: Urology

## 2021-07-06 VITALS — BP 101/62 | HR 105

## 2021-07-06 DIAGNOSIS — R3912 Poor urinary stream: Secondary | ICD-10-CM

## 2021-07-06 DIAGNOSIS — Z7901 Long term (current) use of anticoagulants: Secondary | ICD-10-CM | POA: Diagnosis not present

## 2021-07-06 DIAGNOSIS — C61 Malignant neoplasm of prostate: Secondary | ICD-10-CM

## 2021-07-06 DIAGNOSIS — I10 Essential (primary) hypertension: Secondary | ICD-10-CM | POA: Diagnosis not present

## 2021-07-06 DIAGNOSIS — N401 Enlarged prostate with lower urinary tract symptoms: Secondary | ICD-10-CM | POA: Diagnosis not present

## 2021-07-06 DIAGNOSIS — N138 Other obstructive and reflux uropathy: Secondary | ICD-10-CM | POA: Diagnosis not present

## 2021-07-06 DIAGNOSIS — Z7983 Long term (current) use of bisphosphonates: Secondary | ICD-10-CM | POA: Diagnosis not present

## 2021-07-06 DIAGNOSIS — E785 Hyperlipidemia, unspecified: Secondary | ICD-10-CM | POA: Diagnosis not present

## 2021-07-06 DIAGNOSIS — Z79899 Other long term (current) drug therapy: Secondary | ICD-10-CM | POA: Diagnosis not present

## 2021-07-06 LAB — URINALYSIS, ROUTINE W REFLEX MICROSCOPIC
Bilirubin, UA: NEGATIVE
Glucose, UA: NEGATIVE
Ketones, UA: NEGATIVE
Leukocytes,UA: NEGATIVE
Nitrite, UA: NEGATIVE
Protein,UA: NEGATIVE
RBC, UA: NEGATIVE
Specific Gravity, UA: <1.005 — ABNORMAL LOW (ref 1.005–1.030)
Urobilinogen, Ur: 0.2 mg/dL (ref 0.2–1.0)
pH, UA: 6 (ref 5.0–7.5)

## 2021-07-06 MED ORDER — LEUPROLIDE ACETATE (6 MONTH) 45 MG ~~LOC~~ KIT
45.0000 mg | PACK | Freq: Once | SUBCUTANEOUS | Status: AC
Start: 2021-07-06 — End: 2021-07-06
  Administered 2021-07-06: 45 mg via SUBCUTANEOUS

## 2021-07-06 NOTE — Progress Notes (Signed)
07/06/2021 2:57 PM   David Schwartz 19-Apr-1946 [id number]  Referring provider: Dettinger, Fransisca Kaufmann, MD [place],  [place] [address]  Followup prostate cancer   HPI: [name] is a 75yo here for followup for prostate cancer and BPH with weak urinary stream. He underwent fiducial markers with SpaceOAR 2 weeks ago. He has not started IMRT. IPSS 9 QOL 3. He is on terazosin.  He has elected to proceed with short course of ADT.    PMH: Past Medical History:  Diagnosis Date   Arthritis    Atrial fibrillation ([place])    Cancer ([place])    Dysrhythmia    Hyperlipidemia    Hypertension     Surgical History: Past Surgical History:  Procedure Laterality Date   CARDIOVERSION N/A 01/29/2020   Procedure: CARDIOVERSION;  Surgeon: Satira Sark, MD;  Location: [facility];  Service: Cardiovascular;  Laterality: N/A;   GOLD SEED IMPLANT N/A 06/23/2021   Procedure: GOLD SEED IMPLANT;  Surgeon: Cleon Gustin, MD;  Location: [facility];  Service: Urology;  Laterality: N/A;   HERNIA REPAIR     KNEE ARTHROSCOPY     SPACE OAR INSTILLATION N/A 06/23/2021   Procedure: SPACE OAR INSTILLATION;  Surgeon: Cleon Gustin, MD;  Location: [facility];  Service: Urology;  Laterality: N/A;    Home Medications:  Allergies as of 07/06/2021   No Known Allergies      Medication List        Accurate as of July 06, 2021  2:57 PM. If you have any questions, ask your nurse or doctor.          STOP taking these medications    benzonatate 100 MG capsule Commonly known as: TESSALON Stopped by: Nicolette Bang, MD   loperamide 2 MG tablet Commonly known as: Imodium A-D Stopped by: Nicolette Bang, MD   ondansetron 4 MG tablet Commonly known as: Zofran Stopped by: Nicolette Bang, MD       TAKE these medications    alendronate 70 MG tablet Commonly known as: FOSAMAX Take 1 tablet (70 mg total) by mouth once a week. Take with a full glass of water on an empty stomach.    calcium carbonate 1500 (600 Ca) MG Tabs tablet Commonly known as: OSCAL Take 600 mg of elemental calcium by mouth daily with breakfast.   Eliquis 5 MG Tabs tablet Generic drug: apixaban Take 1 tablet by mouth twice daily   ketoconazole 2 % cream Commonly known as: NIZORAL Apply 1 application topically daily.   losartan 25 MG tablet Commonly known as: COZAAR Take 1 tablet by mouth once daily   metoprolol succinate 50 MG 24 hr tablet Commonly known as: TOPROL-XL Take 1 tablet by mouth once daily   rosuvastatin 5 MG tablet Commonly known as: CRESTOR TAKE 1 TABLET BY MOUTH  DAILY AT 2 PM   tadalafil 20 MG tablet Commonly known as: CIALIS Take 1 tablet (20 mg total) by mouth as needed for erectile dysfunction.   terazosin 2 MG capsule Commonly known as: HYTRIN Take 1 capsule (2 mg total) by mouth at bedtime.   traMADol 50 MG tablet Commonly known as: Ultram Take 1 tablet (50 mg total) by mouth every 6 (six) hours as needed.   triamcinolone cream 0.1 % Commonly known as: KENALOG Apply 1 application topically 2 (two) times daily. What changed:  when to take this reasons to take this   Vitamin D3 25 MCG (1000 UT) Caps Take 3,000 Units  by mouth daily.        Allergies: No Known Allergies  Family History: Family History  Problem Relation Age of Onset   Diabetes Mother    Hypertension Mother    Early death Father    Arthritis Maternal Grandmother    Hypertension Maternal Grandfather    Hypertension Paternal Grandmother    Cancer Paternal Grandfather        lung    Social History:  reports that he has never smoked. He has never used smokeless tobacco. He reports current alcohol use. He reports that he does not use drugs.  ROS: All other review of systems were reviewed and are negative except what is noted above in HPI  Physical Exam: BP 101/62   Pulse (!) 105   Constitutional:  Alert and oriented, No acute distress. HEENT: [place] AT, moist mucus membranes.   Trachea midline, no masses. Cardiovascular: No clubbing, cyanosis, or edema. Respiratory: Normal respiratory effort, no increased work of breathing. GI: Abdomen is soft, nontender, nondistended, no abdominal masses GU: No CVA tenderness.  Lymph: No cervical or inguinal lymphadenopathy. Skin: No rashes, bruises or suspicious lesions. Neurologic: Grossly intact, no focal deficits, moving all 4 extremities. Psychiatric: Normal mood and affect.  Laboratory Data: Lab Results  Component Value Date   WBC 6.6 04/07/2021   HGB 14.6 04/07/2021   HCT 40.6 04/07/2021   MCV 91 04/07/2021   PLT 190 04/07/2021    Lab Results  Component Value Date   CREATININE 0.77 05/11/2021    No results found for: PSA  No results found for: TESTOSTERONE  No results found for: HGBA1C  Urinalysis    Component Value Date/Time   APPEARANCEUR Clear 06/15/2021 1314   GLUCOSEU Negative 06/15/2021 1314   BILIRUBINUR Negative 06/15/2021 1314   PROTEINUR Negative 06/15/2021 1314   NITRITE Negative 06/15/2021 1314   LEUKOCYTESUR Negative 06/15/2021 1314    Lab Results  Component Value Date   LABMICR Comment 06/15/2021    Pertinent Imaging:  No results found for this or any previous visit.  No results found for this or any previous visit.  No results found for this or any previous visit.  No results found for this or any previous visit.  No results found for this or any previous visit.  No results found for this or any previous visit.  No results found for this or any previous visit.  No results found for this or any previous visit.   Assessment & Plan:    1. Prostate cancer ([name]) -Eligard 45mg  today -RTC 3 months with PSA - Urinalysis, Routine w reflex microscopic  2. Benign prostatic hyperplasia with urinary obstruction -continue terazosin  3. Weak urinary stream -continue terazosin   No follow-ups on file.  Nicolette Bang, MD  [facility] [place]

## 2021-07-06 NOTE — Progress Notes (Signed)
Urological Symptom Review  Patient is experiencing the following symptoms: Frequent urination Hard to postpone urination Get up at night to urinate Weak stream   Review of Systems  Gastrointestinal (upper)  : Negative for upper GI symptoms  Gastrointestinal (lower) : Negative for lower GI symptoms  Constitutional : Negative for symptoms  Skin: Negative for skin symptoms  Eyes: Negative for eye symptoms  Ear/Nose/Throat : Negative for Ear/Nose/Throat symptoms  Hematologic/Lymphatic: Negative for Hematologic/Lymphatic symptoms  Cardiovascular : Negative for cardiovascular symptoms  Respiratory : Negative for respiratory symptoms  Endocrine: Negative for endocrine symptoms  Musculoskeletal: Back pain Joint pain  Neurological: Negative for neurological symptoms  Psychologic: Negative for psychiatric symptoms

## 2021-07-06 NOTE — Patient Instructions (Signed)
Prostate Cancer The prostate is a small gland that produces fluid that makes up semen (seminal fluid). It is located below the bladder in men, in front of the rectum. Prostate cancer is the abnormal growth of cells in the prostate gland. What are the causes? The exact cause of this condition is not known. What increases the risk? You are more likely to develop this condition if: You are 75 years of age or older. You have a family history of prostate cancer. You have a family history of breast and ovarian cancer. You have genes that are passed from parent to child (inherited), such as BRCA1 and BRCA2. You have Lynch syndrome. African American men and men of African descent are diagnosed with prostate cancer at higher rates than other men. The reasons for this are not well understood and are likely due to a combination of genetic and environmental factors. What are the signs or symptoms? Symptoms of this condition include: Problems with urination. This may include: A weak or interrupted flow of urine. Trouble starting or stopping urination. Trouble emptying the bladder all the way. The need to urinate more often, especially at night. Blood in urine or semen. Persistent pain or discomfort in the lower back, lower abdomen, or hips. Trouble getting an erection. Weakness or numbness in the legs or feet. How is this diagnosed? This condition can be diagnosed with: A digital rectal exam. For this exam, a health care provider inserts a gloved finger into the rectum to feel the prostate gland. A blood test called a prostate-specific antigen (PSA) test. A procedure in which a sample of tissue is taken from the prostate and checked under a microscope (prostate biopsy). An imaging test called transrectal ultrasonography. Once the condition is diagnosed, tests will be done to determine how far the cancer has spread. This is called staging the cancer. Staging may involve imaging tests, such as a bone  scan, CT scan, PET scan, or MRI. Stages of prostate cancer The stages of prostate cancer are as follows: Stage 1 (I). At this stage, the cancer is found in the prostate only. The cancer is not visible on imaging tests, and it is usually found by accident, such as during prostate surgery. Stage 2 (II). At this stage, the cancer is more advanced than it is in stage 1, but the cancer has not spread outside the prostate. Stage 3 (III). At this stage, the cancer has spread beyond the outer layer of the prostate to nearby tissues. The cancer may be found in the seminal vesicles, which are near the bladder and the prostate. Stage 4 (IV). At this stage, the cancer has spread to other parts of the body, such as the lymph nodes, bones, bladder, rectum, liver, or lungs. Prostate cancer grading Prostate cancer is also graded according to how the cancer cells look under a microscope. This is called the Gleason score and the total score can range from 6-10, indicating how likely it is that the cancer will spread (metastasize) to other parts of the body. The higher the score, the greater the likelihood that the cancer will spread. Gleason 6 or lower: This indicates that the cancer cells look similar to normal prostate cells (well differentiated). Gleason 7: This indicates that the cancer cells look somewhat similar to normal prostate cells (moderately differentiated). Gleason 8, 9, or 10: This indicates that the cancer cells look very different than normal prostate cells (poorly differentiated). How is this treated? Treatment for this condition depends on several factors,  including the stage of the cancer, your age, personal preferences, and your overall health. Talk with your health care provider about treatment options that are recommended for you. Common treatments include: Observation for early stage prostate cancer (active surveillance). This involves having exams, blood tests, and in some cases, more biopsies.  For some men, this is the only treatment needed. Surgery. Types of surgeries include: Open surgery (radical prostatectomy). In this surgery, a larger incision is made to remove the prostate. A laparoscopic radical prostatectomy. This is a surgery to remove the prostate and lymph nodes through several small incisions. It is often referred to as a minimally invasive surgery. A robotic radical prostatectomy. This is laparoscopic surgery to remove the prostate and lymph nodes with the help of robotic arms that are controlled by the surgeon. Cryoablation. This is surgery to freeze and destroy cancer cells. Radiation treatment. Types of radiation treatment include: External beam radiation. This type aims beams of radiation from outside the body at the prostate to destroy cancerous cells. Brachytherapy. This type uses radioactive needles, seeds, wires, or tubes that are implanted into the prostate gland. Like external beam radiation, brachytherapy destroys cancerous cells. An advantage is that this type of radiation limits the damage to surrounding tissue and has fewer side effects. Chemotherapy. This treatment kills cancer cells or stops them from multiplying. It kills both cancer cells and normal cells. Targeted therapy. This treatment uses medicines to kill cancer cells without damaging normal cells. Hormone treatment. This treatment involves taking medicines that act on testosterone, one of the male hormones, by: Stopping your body from producing testosterone. Blocking testosterone from reaching cancer cells. Follow these instructions at home: Lifestyle Do not use any products that contain nicotine or tobacco. These products include cigarettes, chewing tobacco, and vaping devices, such as e-cigarettes. If you need help quitting, ask your health care provider. Eat a healthy diet. To do this: Eat foods that are high in fiber. These include beans, whole grains, and fresh fruits and vegetables. Limit  foods that are high in fat and sugar. These include fried or sweet foods. Treatment for prostate cancer may affect sexual function. If you have a partner, continue to have intimate moments. This may include touching, holding, hugging, and caressing your partner. Get plenty of sleep. Consider joining a support group for men who have prostate cancer. Meeting with a support group may help you learn to manage the stress of having cancer. General instructions Take over-the-counter and prescription medicines only as told by your health care provider. If you have to go to the hospital, notify your cancer specialist (oncologist). Keep all follow-up visits. This is important. Where to find more information American Cancer Society: www.cancer.org American Society of Clinical Oncology: www.cancer.net National Cancer Institute: www.cancer.gov Contact a health care provider if: You have new or increasing trouble urinating. You have new or increasing blood in your urine. You have new or increasing pain in your hips, back, or chest. Get help right away if: You have weakness or numbness in your legs. You cannot control urination or your bowel movements (incontinence). You have chills or a fever. Summary The prostate is a small gland that is involved in the production of semen. It is located below a man's bladder, in front of the rectum. Prostate cancer is the abnormal growth of cells in the prostate gland. Treatment for this condition depends on the stage of the cancer, your age, personal preferences, and your overall health. Talk with your health care provider about treatment   options that are recommended for you. Consider joining a support group for men who have prostate cancer. Meeting with a support group may help you learn to manage the stress of having cancer. This information is not intended to replace advice given to you by your health care provider. Make sure you discuss any questions you have with  your health care provider. Document Revised: 11/17/2020 Document Reviewed: 11/17/2020 Elsevier Patient Education  2022 Elsevier Inc.  

## 2021-07-12 DIAGNOSIS — E785 Hyperlipidemia, unspecified: Secondary | ICD-10-CM | POA: Diagnosis not present

## 2021-07-12 DIAGNOSIS — Z7901 Long term (current) use of anticoagulants: Secondary | ICD-10-CM | POA: Diagnosis not present

## 2021-07-12 DIAGNOSIS — Z7983 Long term (current) use of bisphosphonates: Secondary | ICD-10-CM | POA: Diagnosis not present

## 2021-07-12 DIAGNOSIS — Z79899 Other long term (current) drug therapy: Secondary | ICD-10-CM | POA: Diagnosis not present

## 2021-07-12 DIAGNOSIS — I10 Essential (primary) hypertension: Secondary | ICD-10-CM | POA: Diagnosis not present

## 2021-07-13 DIAGNOSIS — I10 Essential (primary) hypertension: Secondary | ICD-10-CM | POA: Diagnosis not present

## 2021-07-13 DIAGNOSIS — Z7901 Long term (current) use of anticoagulants: Secondary | ICD-10-CM | POA: Diagnosis not present

## 2021-07-13 DIAGNOSIS — E785 Hyperlipidemia, unspecified: Secondary | ICD-10-CM | POA: Diagnosis not present

## 2021-07-13 DIAGNOSIS — Z79899 Other long term (current) drug therapy: Secondary | ICD-10-CM | POA: Diagnosis not present

## 2021-07-13 DIAGNOSIS — Z7983 Long term (current) use of bisphosphonates: Secondary | ICD-10-CM | POA: Diagnosis not present

## 2021-07-14 DIAGNOSIS — E785 Hyperlipidemia, unspecified: Secondary | ICD-10-CM | POA: Diagnosis not present

## 2021-07-14 DIAGNOSIS — Z7901 Long term (current) use of anticoagulants: Secondary | ICD-10-CM | POA: Diagnosis not present

## 2021-07-14 DIAGNOSIS — I10 Essential (primary) hypertension: Secondary | ICD-10-CM | POA: Diagnosis not present

## 2021-07-14 DIAGNOSIS — Z79899 Other long term (current) drug therapy: Secondary | ICD-10-CM | POA: Diagnosis not present

## 2021-07-14 DIAGNOSIS — Z7983 Long term (current) use of bisphosphonates: Secondary | ICD-10-CM | POA: Diagnosis not present

## 2021-07-15 DIAGNOSIS — E785 Hyperlipidemia, unspecified: Secondary | ICD-10-CM | POA: Diagnosis not present

## 2021-07-15 DIAGNOSIS — Z7983 Long term (current) use of bisphosphonates: Secondary | ICD-10-CM | POA: Diagnosis not present

## 2021-07-15 DIAGNOSIS — I10 Essential (primary) hypertension: Secondary | ICD-10-CM | POA: Diagnosis not present

## 2021-07-15 DIAGNOSIS — Z7901 Long term (current) use of anticoagulants: Secondary | ICD-10-CM | POA: Diagnosis not present

## 2021-07-15 DIAGNOSIS — Z79899 Other long term (current) drug therapy: Secondary | ICD-10-CM | POA: Diagnosis not present

## 2021-07-18 DIAGNOSIS — I10 Essential (primary) hypertension: Secondary | ICD-10-CM | POA: Diagnosis not present

## 2021-07-18 DIAGNOSIS — Z7901 Long term (current) use of anticoagulants: Secondary | ICD-10-CM | POA: Diagnosis not present

## 2021-07-18 DIAGNOSIS — E785 Hyperlipidemia, unspecified: Secondary | ICD-10-CM | POA: Diagnosis not present

## 2021-07-18 DIAGNOSIS — Z7983 Long term (current) use of bisphosphonates: Secondary | ICD-10-CM | POA: Diagnosis not present

## 2021-07-18 DIAGNOSIS — Z79899 Other long term (current) drug therapy: Secondary | ICD-10-CM | POA: Diagnosis not present

## 2021-07-19 DIAGNOSIS — E785 Hyperlipidemia, unspecified: Secondary | ICD-10-CM | POA: Diagnosis not present

## 2021-07-19 DIAGNOSIS — Z7901 Long term (current) use of anticoagulants: Secondary | ICD-10-CM | POA: Diagnosis not present

## 2021-07-19 DIAGNOSIS — I10 Essential (primary) hypertension: Secondary | ICD-10-CM | POA: Diagnosis not present

## 2021-07-19 DIAGNOSIS — Z79899 Other long term (current) drug therapy: Secondary | ICD-10-CM | POA: Diagnosis not present

## 2021-07-19 DIAGNOSIS — Z7983 Long term (current) use of bisphosphonates: Secondary | ICD-10-CM | POA: Diagnosis not present

## 2021-07-20 DIAGNOSIS — Z7901 Long term (current) use of anticoagulants: Secondary | ICD-10-CM | POA: Diagnosis not present

## 2021-07-20 DIAGNOSIS — I10 Essential (primary) hypertension: Secondary | ICD-10-CM | POA: Diagnosis not present

## 2021-07-20 DIAGNOSIS — Z7983 Long term (current) use of bisphosphonates: Secondary | ICD-10-CM | POA: Diagnosis not present

## 2021-07-20 DIAGNOSIS — Z79899 Other long term (current) drug therapy: Secondary | ICD-10-CM | POA: Diagnosis not present

## 2021-07-20 DIAGNOSIS — E785 Hyperlipidemia, unspecified: Secondary | ICD-10-CM | POA: Diagnosis not present

## 2021-07-21 DIAGNOSIS — Z7983 Long term (current) use of bisphosphonates: Secondary | ICD-10-CM | POA: Diagnosis not present

## 2021-07-21 DIAGNOSIS — Z79899 Other long term (current) drug therapy: Secondary | ICD-10-CM | POA: Diagnosis not present

## 2021-07-21 DIAGNOSIS — Z7901 Long term (current) use of anticoagulants: Secondary | ICD-10-CM | POA: Diagnosis not present

## 2021-07-21 DIAGNOSIS — I10 Essential (primary) hypertension: Secondary | ICD-10-CM | POA: Diagnosis not present

## 2021-07-21 DIAGNOSIS — E785 Hyperlipidemia, unspecified: Secondary | ICD-10-CM | POA: Diagnosis not present

## 2021-07-22 DIAGNOSIS — I10 Essential (primary) hypertension: Secondary | ICD-10-CM | POA: Diagnosis not present

## 2021-07-22 DIAGNOSIS — Z7983 Long term (current) use of bisphosphonates: Secondary | ICD-10-CM | POA: Diagnosis not present

## 2021-07-22 DIAGNOSIS — Z79899 Other long term (current) drug therapy: Secondary | ICD-10-CM | POA: Diagnosis not present

## 2021-07-22 DIAGNOSIS — Z7901 Long term (current) use of anticoagulants: Secondary | ICD-10-CM | POA: Diagnosis not present

## 2021-07-22 DIAGNOSIS — E785 Hyperlipidemia, unspecified: Secondary | ICD-10-CM | POA: Diagnosis not present

## 2021-07-25 DIAGNOSIS — Z7983 Long term (current) use of bisphosphonates: Secondary | ICD-10-CM | POA: Diagnosis not present

## 2021-07-25 DIAGNOSIS — Z79899 Other long term (current) drug therapy: Secondary | ICD-10-CM | POA: Diagnosis not present

## 2021-07-25 DIAGNOSIS — Z7901 Long term (current) use of anticoagulants: Secondary | ICD-10-CM | POA: Diagnosis not present

## 2021-07-25 DIAGNOSIS — E785 Hyperlipidemia, unspecified: Secondary | ICD-10-CM | POA: Diagnosis not present

## 2021-07-25 DIAGNOSIS — I10 Essential (primary) hypertension: Secondary | ICD-10-CM | POA: Diagnosis not present

## 2021-07-25 NOTE — Addendum Note (Signed)
Encounter addended by: Freeman Caldron, PA-C on: 07/25/2021 12:45 PM  Actions taken: Clinical Note Signed

## 2021-07-25 NOTE — Addendum Note (Signed)
Encounter addended by: Freeman Caldron, PA-C on: 07/25/2021 12:47 PM  Actions taken: Clinical Note Signed

## 2021-07-26 DIAGNOSIS — E785 Hyperlipidemia, unspecified: Secondary | ICD-10-CM | POA: Diagnosis not present

## 2021-07-26 DIAGNOSIS — Z7901 Long term (current) use of anticoagulants: Secondary | ICD-10-CM | POA: Diagnosis not present

## 2021-07-26 DIAGNOSIS — Z79899 Other long term (current) drug therapy: Secondary | ICD-10-CM | POA: Diagnosis not present

## 2021-07-26 DIAGNOSIS — I10 Essential (primary) hypertension: Secondary | ICD-10-CM | POA: Diagnosis not present

## 2021-07-26 DIAGNOSIS — Z7983 Long term (current) use of bisphosphonates: Secondary | ICD-10-CM | POA: Diagnosis not present

## 2021-07-27 DIAGNOSIS — E785 Hyperlipidemia, unspecified: Secondary | ICD-10-CM | POA: Diagnosis not present

## 2021-07-27 DIAGNOSIS — I10 Essential (primary) hypertension: Secondary | ICD-10-CM | POA: Diagnosis not present

## 2021-07-27 DIAGNOSIS — Z7901 Long term (current) use of anticoagulants: Secondary | ICD-10-CM | POA: Diagnosis not present

## 2021-07-27 DIAGNOSIS — Z79899 Other long term (current) drug therapy: Secondary | ICD-10-CM | POA: Diagnosis not present

## 2021-07-27 DIAGNOSIS — Z7983 Long term (current) use of bisphosphonates: Secondary | ICD-10-CM | POA: Diagnosis not present

## 2021-08-01 DIAGNOSIS — Z79899 Other long term (current) drug therapy: Secondary | ICD-10-CM | POA: Diagnosis not present

## 2021-08-01 DIAGNOSIS — E785 Hyperlipidemia, unspecified: Secondary | ICD-10-CM | POA: Diagnosis not present

## 2021-08-01 DIAGNOSIS — Z7983 Long term (current) use of bisphosphonates: Secondary | ICD-10-CM | POA: Diagnosis not present

## 2021-08-01 DIAGNOSIS — I10 Essential (primary) hypertension: Secondary | ICD-10-CM | POA: Diagnosis not present

## 2021-08-01 DIAGNOSIS — Z7901 Long term (current) use of anticoagulants: Secondary | ICD-10-CM | POA: Diagnosis not present

## 2021-08-02 DIAGNOSIS — Z79899 Other long term (current) drug therapy: Secondary | ICD-10-CM | POA: Diagnosis not present

## 2021-08-02 DIAGNOSIS — Z7983 Long term (current) use of bisphosphonates: Secondary | ICD-10-CM | POA: Diagnosis not present

## 2021-08-02 DIAGNOSIS — Z7901 Long term (current) use of anticoagulants: Secondary | ICD-10-CM | POA: Diagnosis not present

## 2021-08-02 DIAGNOSIS — I10 Essential (primary) hypertension: Secondary | ICD-10-CM | POA: Diagnosis not present

## 2021-08-02 DIAGNOSIS — E785 Hyperlipidemia, unspecified: Secondary | ICD-10-CM | POA: Diagnosis not present

## 2021-08-03 DIAGNOSIS — Z7983 Long term (current) use of bisphosphonates: Secondary | ICD-10-CM | POA: Diagnosis not present

## 2021-08-03 DIAGNOSIS — Z79899 Other long term (current) drug therapy: Secondary | ICD-10-CM | POA: Diagnosis not present

## 2021-08-03 DIAGNOSIS — I10 Essential (primary) hypertension: Secondary | ICD-10-CM | POA: Diagnosis not present

## 2021-08-03 DIAGNOSIS — Z7901 Long term (current) use of anticoagulants: Secondary | ICD-10-CM | POA: Diagnosis not present

## 2021-08-03 DIAGNOSIS — E785 Hyperlipidemia, unspecified: Secondary | ICD-10-CM | POA: Diagnosis not present

## 2021-08-04 DIAGNOSIS — Z79899 Other long term (current) drug therapy: Secondary | ICD-10-CM | POA: Diagnosis not present

## 2021-08-04 DIAGNOSIS — E785 Hyperlipidemia, unspecified: Secondary | ICD-10-CM | POA: Diagnosis not present

## 2021-08-04 DIAGNOSIS — I10 Essential (primary) hypertension: Secondary | ICD-10-CM | POA: Diagnosis not present

## 2021-08-04 DIAGNOSIS — Z7983 Long term (current) use of bisphosphonates: Secondary | ICD-10-CM | POA: Diagnosis not present

## 2021-08-04 DIAGNOSIS — Z7901 Long term (current) use of anticoagulants: Secondary | ICD-10-CM | POA: Diagnosis not present

## 2021-08-05 ENCOUNTER — Other Ambulatory Visit: Payer: Self-pay | Admitting: Cardiology

## 2021-08-05 DIAGNOSIS — I10 Essential (primary) hypertension: Secondary | ICD-10-CM | POA: Diagnosis not present

## 2021-08-05 DIAGNOSIS — Z7983 Long term (current) use of bisphosphonates: Secondary | ICD-10-CM | POA: Diagnosis not present

## 2021-08-05 DIAGNOSIS — Z7901 Long term (current) use of anticoagulants: Secondary | ICD-10-CM | POA: Diagnosis not present

## 2021-08-05 DIAGNOSIS — E785 Hyperlipidemia, unspecified: Secondary | ICD-10-CM | POA: Diagnosis not present

## 2021-08-05 DIAGNOSIS — Z79899 Other long term (current) drug therapy: Secondary | ICD-10-CM | POA: Diagnosis not present

## 2021-08-08 DIAGNOSIS — I10 Essential (primary) hypertension: Secondary | ICD-10-CM | POA: Diagnosis not present

## 2021-08-08 DIAGNOSIS — Z7983 Long term (current) use of bisphosphonates: Secondary | ICD-10-CM | POA: Diagnosis not present

## 2021-08-08 DIAGNOSIS — Z79899 Other long term (current) drug therapy: Secondary | ICD-10-CM | POA: Diagnosis not present

## 2021-08-08 DIAGNOSIS — Z7901 Long term (current) use of anticoagulants: Secondary | ICD-10-CM | POA: Diagnosis not present

## 2021-08-08 DIAGNOSIS — E785 Hyperlipidemia, unspecified: Secondary | ICD-10-CM | POA: Diagnosis not present

## 2021-08-09 DIAGNOSIS — Z79899 Other long term (current) drug therapy: Secondary | ICD-10-CM | POA: Diagnosis not present

## 2021-08-09 DIAGNOSIS — Z7983 Long term (current) use of bisphosphonates: Secondary | ICD-10-CM | POA: Diagnosis not present

## 2021-08-09 DIAGNOSIS — Z7901 Long term (current) use of anticoagulants: Secondary | ICD-10-CM | POA: Diagnosis not present

## 2021-08-09 DIAGNOSIS — I10 Essential (primary) hypertension: Secondary | ICD-10-CM | POA: Diagnosis not present

## 2021-08-09 DIAGNOSIS — E785 Hyperlipidemia, unspecified: Secondary | ICD-10-CM | POA: Diagnosis not present

## 2021-08-10 DIAGNOSIS — Z79899 Other long term (current) drug therapy: Secondary | ICD-10-CM | POA: Diagnosis not present

## 2021-08-10 DIAGNOSIS — Z7983 Long term (current) use of bisphosphonates: Secondary | ICD-10-CM | POA: Diagnosis not present

## 2021-08-10 DIAGNOSIS — I10 Essential (primary) hypertension: Secondary | ICD-10-CM | POA: Diagnosis not present

## 2021-08-10 DIAGNOSIS — Z7901 Long term (current) use of anticoagulants: Secondary | ICD-10-CM | POA: Diagnosis not present

## 2021-08-10 DIAGNOSIS — E785 Hyperlipidemia, unspecified: Secondary | ICD-10-CM | POA: Diagnosis not present

## 2021-08-11 DIAGNOSIS — Z7901 Long term (current) use of anticoagulants: Secondary | ICD-10-CM | POA: Diagnosis not present

## 2021-08-11 DIAGNOSIS — E785 Hyperlipidemia, unspecified: Secondary | ICD-10-CM | POA: Diagnosis not present

## 2021-08-11 DIAGNOSIS — Z7983 Long term (current) use of bisphosphonates: Secondary | ICD-10-CM | POA: Diagnosis not present

## 2021-08-11 DIAGNOSIS — I10 Essential (primary) hypertension: Secondary | ICD-10-CM | POA: Diagnosis not present

## 2021-08-11 DIAGNOSIS — Z79899 Other long term (current) drug therapy: Secondary | ICD-10-CM | POA: Diagnosis not present

## 2021-08-12 DIAGNOSIS — Z79899 Other long term (current) drug therapy: Secondary | ICD-10-CM | POA: Diagnosis not present

## 2021-08-12 DIAGNOSIS — E785 Hyperlipidemia, unspecified: Secondary | ICD-10-CM | POA: Diagnosis not present

## 2021-08-12 DIAGNOSIS — I10 Essential (primary) hypertension: Secondary | ICD-10-CM | POA: Diagnosis not present

## 2021-08-12 DIAGNOSIS — Z7983 Long term (current) use of bisphosphonates: Secondary | ICD-10-CM | POA: Diagnosis not present

## 2021-08-12 DIAGNOSIS — Z7901 Long term (current) use of anticoagulants: Secondary | ICD-10-CM | POA: Diagnosis not present

## 2021-08-13 ENCOUNTER — Other Ambulatory Visit: Payer: Self-pay | Admitting: Cardiology

## 2021-08-13 DIAGNOSIS — I4891 Unspecified atrial fibrillation: Secondary | ICD-10-CM

## 2021-08-13 DIAGNOSIS — R000 Tachycardia, unspecified: Secondary | ICD-10-CM

## 2021-08-15 DIAGNOSIS — I10 Essential (primary) hypertension: Secondary | ICD-10-CM | POA: Diagnosis not present

## 2021-08-15 DIAGNOSIS — Z79899 Other long term (current) drug therapy: Secondary | ICD-10-CM | POA: Diagnosis not present

## 2021-08-15 DIAGNOSIS — Z7983 Long term (current) use of bisphosphonates: Secondary | ICD-10-CM | POA: Diagnosis not present

## 2021-08-15 DIAGNOSIS — Z7901 Long term (current) use of anticoagulants: Secondary | ICD-10-CM | POA: Diagnosis not present

## 2021-08-15 DIAGNOSIS — E785 Hyperlipidemia, unspecified: Secondary | ICD-10-CM | POA: Diagnosis not present

## 2021-08-16 DIAGNOSIS — Z7983 Long term (current) use of bisphosphonates: Secondary | ICD-10-CM | POA: Diagnosis not present

## 2021-08-16 DIAGNOSIS — Z79899 Other long term (current) drug therapy: Secondary | ICD-10-CM | POA: Diagnosis not present

## 2021-08-16 DIAGNOSIS — E785 Hyperlipidemia, unspecified: Secondary | ICD-10-CM | POA: Diagnosis not present

## 2021-08-16 DIAGNOSIS — I10 Essential (primary) hypertension: Secondary | ICD-10-CM | POA: Diagnosis not present

## 2021-08-16 DIAGNOSIS — Z7901 Long term (current) use of anticoagulants: Secondary | ICD-10-CM | POA: Diagnosis not present

## 2021-08-17 DIAGNOSIS — Z7901 Long term (current) use of anticoagulants: Secondary | ICD-10-CM | POA: Diagnosis not present

## 2021-08-17 DIAGNOSIS — Z7983 Long term (current) use of bisphosphonates: Secondary | ICD-10-CM | POA: Diagnosis not present

## 2021-08-17 DIAGNOSIS — I10 Essential (primary) hypertension: Secondary | ICD-10-CM | POA: Diagnosis not present

## 2021-08-17 DIAGNOSIS — Z79899 Other long term (current) drug therapy: Secondary | ICD-10-CM | POA: Diagnosis not present

## 2021-08-17 DIAGNOSIS — E785 Hyperlipidemia, unspecified: Secondary | ICD-10-CM | POA: Diagnosis not present

## 2021-08-18 DIAGNOSIS — E785 Hyperlipidemia, unspecified: Secondary | ICD-10-CM | POA: Diagnosis not present

## 2021-08-18 DIAGNOSIS — Z7983 Long term (current) use of bisphosphonates: Secondary | ICD-10-CM | POA: Diagnosis not present

## 2021-08-18 DIAGNOSIS — Z79899 Other long term (current) drug therapy: Secondary | ICD-10-CM | POA: Diagnosis not present

## 2021-08-18 DIAGNOSIS — Z7901 Long term (current) use of anticoagulants: Secondary | ICD-10-CM | POA: Diagnosis not present

## 2021-08-18 DIAGNOSIS — I10 Essential (primary) hypertension: Secondary | ICD-10-CM | POA: Diagnosis not present

## 2021-08-19 DIAGNOSIS — Z7901 Long term (current) use of anticoagulants: Secondary | ICD-10-CM | POA: Diagnosis not present

## 2021-08-19 DIAGNOSIS — Z7983 Long term (current) use of bisphosphonates: Secondary | ICD-10-CM | POA: Diagnosis not present

## 2021-08-19 DIAGNOSIS — E785 Hyperlipidemia, unspecified: Secondary | ICD-10-CM | POA: Diagnosis not present

## 2021-08-19 DIAGNOSIS — Z79899 Other long term (current) drug therapy: Secondary | ICD-10-CM | POA: Diagnosis not present

## 2021-08-19 DIAGNOSIS — I10 Essential (primary) hypertension: Secondary | ICD-10-CM | POA: Diagnosis not present

## 2021-08-22 DIAGNOSIS — Z79899 Other long term (current) drug therapy: Secondary | ICD-10-CM | POA: Diagnosis not present

## 2021-08-22 DIAGNOSIS — E785 Hyperlipidemia, unspecified: Secondary | ICD-10-CM | POA: Diagnosis not present

## 2021-08-22 DIAGNOSIS — I10 Essential (primary) hypertension: Secondary | ICD-10-CM | POA: Diagnosis not present

## 2021-08-22 DIAGNOSIS — Z7901 Long term (current) use of anticoagulants: Secondary | ICD-10-CM | POA: Diagnosis not present

## 2021-08-22 DIAGNOSIS — Z7983 Long term (current) use of bisphosphonates: Secondary | ICD-10-CM | POA: Diagnosis not present

## 2021-08-24 ENCOUNTER — Other Ambulatory Visit: Payer: Medicare Other

## 2021-08-31 ENCOUNTER — Ambulatory Visit: Payer: Medicare Other | Admitting: Urology

## 2021-09-22 ENCOUNTER — Other Ambulatory Visit: Payer: Self-pay | Admitting: Family Medicine

## 2021-09-26 ENCOUNTER — Other Ambulatory Visit: Payer: Self-pay | Admitting: Family Medicine

## 2021-09-26 DIAGNOSIS — E782 Mixed hyperlipidemia: Secondary | ICD-10-CM

## 2021-10-05 ENCOUNTER — Other Ambulatory Visit: Payer: Self-pay

## 2021-10-05 ENCOUNTER — Other Ambulatory Visit: Payer: Medicare Other

## 2021-10-05 DIAGNOSIS — C61 Malignant neoplasm of prostate: Secondary | ICD-10-CM

## 2021-10-06 LAB — PSA: Prostate Specific Ag, Serum: <0.1 ng/mL (ref 0.0–4.0)

## 2021-10-10 ENCOUNTER — Ambulatory Visit (INDEPENDENT_AMBULATORY_CARE_PROVIDER_SITE_OTHER): Payer: Medicare Other | Admitting: Family Medicine

## 2021-10-10 ENCOUNTER — Encounter: Payer: Self-pay | Admitting: Family Medicine

## 2021-10-10 VITALS — BP 133/88 | HR 95 | Ht 68.0 in | Wt 171.0 lb

## 2021-10-10 DIAGNOSIS — I4819 Other persistent atrial fibrillation: Secondary | ICD-10-CM

## 2021-10-10 DIAGNOSIS — I10 Essential (primary) hypertension: Secondary | ICD-10-CM | POA: Diagnosis not present

## 2021-10-10 DIAGNOSIS — E782 Mixed hyperlipidemia: Secondary | ICD-10-CM | POA: Diagnosis not present

## 2021-10-10 DIAGNOSIS — M818 Other osteoporosis without current pathological fracture: Secondary | ICD-10-CM

## 2021-10-10 LAB — LIPID PANEL

## 2021-10-10 MED ORDER — ROSUVASTATIN CALCIUM 5 MG PO TABS: ORAL_TABLET | ORAL | 3 refills | Status: DC

## 2021-10-10 NOTE — Progress Notes (Signed)
BP 133/88    Pulse 95    Ht '5\' 8"'  (1.727 m)    Wt 171 lb (77.6 kg)    SpO2 96%    BMI 26.00 kg/m    Subjective:   Patient ID: David Schwartz, male    DOB: February 26, 1946, 76 y.o.   MRN: [id number]  HPI: David Schwartz is a 76 y.o. male presenting on 10/10/2021 for Medical Management of Chronic Issues, Hyperlipidemia, and Hypertension   HPI Hypertension Patient is currently on losartan and metoprolol, and their blood pressure today is 133/88. Patient denies any lightheadedness or dizziness. Patient denies headaches, blurred vision, chest pains, shortness of breath, or weakness. Denies any side effects from medication and is content with current medication.   Hyperlipidemia Patient is coming in for recheck of his hyperlipidemia. The patient is currently taking crestor. They deny any issues with myalgias or history of liver damage from it. They deny any focal numbness or weakness or chest pain.   Afib recheck Patient currently takes Eliquis.  Patient denies any bruising or bleeding.  He is doing very well.  He denies any chest pain or palpitations.  Relevant past medical, surgical, family and social history reviewed and updated as indicated. Interim medical history since our last visit reviewed. Allergies and medications reviewed and updated.  Review of Systems  Constitutional:  Negative for chills and fever.  Eyes:  Negative for visual disturbance.  Respiratory:  Negative for shortness of breath and wheezing.   Cardiovascular:  Negative for chest pain and leg swelling.  Musculoskeletal:  Negative for back pain and gait problem.  Skin:  Negative for rash.  Neurological:  Negative for dizziness, weakness and light-headedness.  All other systems reviewed and are negative.  Per HPI unless specifically indicated above   Allergies as of 10/10/2021   No Known Allergies      Medication List        Accurate as of October 10, 2021  9:58 AM. If you have any questions, ask your nurse or  doctor.          STOP taking these medications    ketoconazole 2 % cream Commonly known as: NIZORAL Stopped by: Fransisca Kaufmann Brylee Mcgreal, MD   traMADol 50 MG tablet Commonly known as: Ultram Stopped by: Worthy Rancher, MD       TAKE these medications    alendronate 70 MG tablet Commonly known as: FOSAMAX TAKE 1 TABLET BY MOUTH  WEEKLY TAKE WITH A FULL  GLASS OF WATER ON AN EMPTY  STOMACH   calcium carbonate 1500 (600 Ca) MG Tabs tablet Commonly known as: OSCAL Take 600 mg of elemental calcium by mouth daily with breakfast.   Eliquis 5 MG Tabs tablet Generic drug: apixaban Take 1 tablet by mouth twice daily   losartan 25 MG tablet Commonly known as: COZAAR Take 1 tablet by mouth once daily   metoprolol succinate 50 MG 24 hr tablet Commonly known as: TOPROL-XL Take 1 tablet by mouth once daily   rosuvastatin 5 MG tablet Commonly known as: CRESTOR Take 1 tablet by mouth daily What changed: See the new instructions. Changed by: Fransisca Kaufmann David Delone, MD   tadalafil 20 MG tablet Commonly known as: CIALIS Take 1 tablet (20 mg total) by mouth as needed for erectile dysfunction.   terazosin 2 MG capsule Commonly known as: HYTRIN Take 1 capsule (2 mg total) by mouth at bedtime.   triamcinolone cream 0.1 % Commonly known as: KENALOG Apply 1 application  topically 2 (two) times daily. What changed:  when to take this reasons to take this   Vitamin D3 25 MCG (1000 UT) Caps Take 3,000 Units by mouth daily.         Objective:   BP 133/88    Pulse 95    Ht '5\' 8"'  (1.727 m)    Wt 171 lb (77.6 kg)    SpO2 96%    BMI 26.00 kg/m   Wt Readings from Last 3 Encounters:  10/10/21 171 lb (77.6 kg)  06/21/21 172 lb 3.2 oz (78.1 kg)  06/01/21 172 lb 3.2 oz (78.1 kg)    Physical Exam Vitals and nursing note reviewed.  Constitutional:      General: He is not in acute distress.    Appearance: He is well-developed. He is not diaphoretic.  Eyes:     General: No scleral  icterus.       Right eye: No discharge.     Conjunctiva/sclera: Conjunctivae normal.     Pupils: Pupils are equal, round, and reactive to light.  Neck:     Thyroid: No thyromegaly.  Cardiovascular:     Rate and Rhythm: Normal rate and regular rhythm.     Heart sounds: Normal heart sounds. No murmur heard. Pulmonary:     Effort: Pulmonary effort is normal. No respiratory distress.     Breath sounds: Normal breath sounds. No wheezing.  Musculoskeletal:        General: Normal range of motion.     Cervical back: Neck supple.  Lymphadenopathy:     Cervical: No cervical adenopathy.  Skin:    General: Skin is warm and dry.     Findings: No rash.  Neurological:     Mental Status: He is alert and oriented to person, place, and time.     Coordination: Coordination normal.  Psychiatric:        Behavior: Behavior normal.      Assessment & Plan:   Problem List Items Addressed This Visit       Cardiovascular and Mediastinum   Hypertension - Primary   Relevant Medications   rosuvastatin (CRESTOR) 5 MG tablet   Other Relevant Orders   CMP14+EGFR   Persistent atrial fibrillation (HCC)   Relevant Medications   rosuvastatin (CRESTOR) 5 MG tablet   Other Relevant Orders   CBC with Differential/Platelet     Musculoskeletal and Integument   Osteoporosis   Relevant Orders   VITAMIN D 25 Hydroxy (Vit-D Deficiency, Fractures)     Other   Hyperlipemia   Relevant Medications   rosuvastatin (CRESTOR) 5 MG tablet   Other Relevant Orders   Lipid panel  Continue current medicine, seems to be doing well.  We will check blood work  Follow up plan: Return in about 6 months (around 04/09/2022), or if symptoms worsen or fail to improve, for htn and hld.  Counseling provided for all of the vaccine components Orders Placed This Encounter  Procedures   CBC with Differential/Platelet   CMP14+EGFR   Lipid panel   VITAMIN D 25 Hydroxy (Vit-D Deficiency, Fractures)    Caryl Pina,  MD [facility] 10/10/2021, 9:58 AM

## 2021-10-11 LAB — CBC WITH DIFFERENTIAL/PLATELET
Basophils Absolute: 0.1 10*3/uL (ref 0.0–0.2)
Basos: 1 %
EOS (ABSOLUTE): 0.2 10*3/uL (ref 0.0–0.4)
Eos: 4 %
Hematocrit: 39.8 % (ref 37.5–51.0)
Hemoglobin: 14.1 g/dL (ref 13.0–17.7)
Immature Grans (Abs): 0 10*3/uL (ref 0.0–0.1)
Immature Granulocytes: 0 %
Lymphocytes Absolute: 1.1 10*3/uL (ref 0.7–3.1)
Lymphs: 19 %
MCH: 31.9 pg (ref 26.6–33.0)
MCHC: 35.4 g/dL (ref 31.5–35.7)
MCV: 90 fL (ref 79–97)
Monocytes Absolute: 0.6 10*3/uL (ref 0.1–0.9)
Monocytes: 11 %
Neutrophils Absolute: 3.6 10*3/uL (ref 1.4–7.0)
Neutrophils: 65 %
Platelets: 199 10*3/uL (ref 150–450)
RBC: 4.42 x10E6/uL (ref 4.14–5.80)
RDW: 12.5 % (ref 11.6–15.4)
WBC: 5.6 10*3/uL (ref 3.4–10.8)

## 2021-10-11 LAB — CMP14+EGFR
ALT: 12 IU/L (ref 0–44)
AST: 19 IU/L (ref 0–40)
Albumin/Globulin Ratio: 1.8 (ref 1.2–2.2)
Albumin: 4.2 g/dL (ref 3.7–4.7)
Alkaline Phosphatase: 46 IU/L (ref 44–121)
BUN/Creatinine Ratio: 21 (ref 10–24)
BUN: 17 mg/dL (ref 8–27)
Bilirubin Total: 0.6 mg/dL (ref 0.0–1.2)
CO2: 24 mmol/L (ref 20–29)
Calcium: 9.3 mg/dL (ref 8.6–10.2)
Chloride: 104 mmol/L (ref 96–106)
Creatinine, Ser: 0.8 mg/dL (ref 0.76–1.27)
Globulin, Total: 2.3 g/dL (ref 1.5–4.5)
Glucose: 95 mg/dL (ref 70–99)
Potassium: 4.8 mmol/L (ref 3.5–5.2)
Sodium: 143 mmol/L (ref 134–144)
Total Protein: 6.5 g/dL (ref 6.0–8.5)
eGFR: 92 mL/min/{1.73_m2} (ref >59)

## 2021-10-11 LAB — VITAMIN D 25 HYDROXY (VIT D DEFICIENCY, FRACTURES): Vit D, 25-Hydroxy: 70.5 ng/mL (ref 30.0–100.0)

## 2021-10-11 LAB — LIPID PANEL
Chol/HDL Ratio: 3.1 ratio (ref 0.0–5.0)
Cholesterol, Total: 169 mg/dL (ref 100–199)
HDL: 54 mg/dL (ref >39)
LDL Chol Calc (NIH): 94 mg/dL (ref 0–99)
Triglycerides: 115 mg/dL (ref 0–149)
VLDL Cholesterol Cal: 21 mg/dL (ref 5–40)

## 2021-10-12 ENCOUNTER — Ambulatory Visit: Payer: Medicare Other | Admitting: Urology

## 2021-10-14 ENCOUNTER — Encounter: Payer: Self-pay | Admitting: Urology

## 2021-10-14 ENCOUNTER — Ambulatory Visit (INDEPENDENT_AMBULATORY_CARE_PROVIDER_SITE_OTHER): Payer: Medicare Other | Admitting: Urology

## 2021-10-14 ENCOUNTER — Other Ambulatory Visit: Payer: Self-pay

## 2021-10-14 VITALS — BP 115/77 | HR 90

## 2021-10-14 DIAGNOSIS — N401 Enlarged prostate with lower urinary tract symptoms: Secondary | ICD-10-CM | POA: Diagnosis not present

## 2021-10-14 DIAGNOSIS — C61 Malignant neoplasm of prostate: Secondary | ICD-10-CM | POA: Diagnosis not present

## 2021-10-14 DIAGNOSIS — N138 Other obstructive and reflux uropathy: Secondary | ICD-10-CM

## 2021-10-14 DIAGNOSIS — R3912 Poor urinary stream: Secondary | ICD-10-CM | POA: Diagnosis not present

## 2021-10-14 LAB — URINALYSIS, ROUTINE W REFLEX MICROSCOPIC
Bilirubin, UA: NEGATIVE
Glucose, UA: NEGATIVE
Ketones, UA: NEGATIVE
Leukocytes,UA: NEGATIVE
Nitrite, UA: NEGATIVE
Protein,UA: NEGATIVE
RBC, UA: NEGATIVE
Specific Gravity, UA: 1.02 (ref 1.005–1.030)
Urobilinogen, Ur: 0.2 mg/dL (ref 0.2–1.0)
pH, UA: 6.5 (ref 5.0–7.5)

## 2021-10-14 LAB — BLADDER SCAN AMB NON-IMAGING: Scan Result: 35

## 2021-10-14 NOTE — Progress Notes (Signed)
post void residual=35 

## 2021-10-14 NOTE — Progress Notes (Signed)
10/14/2021 11:49 AM   David Schwartz 12-06-1945 [id number]  Referring provider: Dettinger, Fransisca Kaufmann, MD [place],  [name] [id number]  Followup prostate cancer and BPH   HPI: David Schwartz is a 76yo here for followup for prostate cancer and BPH. PSA undetectable. He finished IMRT 09/2021. He had a 6 month course of ADT. IPSS 8 QOL 2 on terazosin. Urine stream strong on terazosin.  Nocturia 2x. NO straining to urinate. No other complaints today   PMH: Past Medical History:  Diagnosis Date   Arthritis    Atrial fibrillation ([place])    Cancer ([place])    Dysrhythmia    Hyperlipidemia    Hypertension     Surgical History: Past Surgical History:  Procedure Laterality Date   CARDIOVERSION N/A 01/29/2020   Procedure: CARDIOVERSION;  Surgeon: Satira Sark, MD;  Location: [facility];  Service: Cardiovascular;  Laterality: N/A;   GOLD SEED IMPLANT N/A 06/23/2021   Procedure: GOLD SEED IMPLANT;  Surgeon: Cleon Gustin, MD;  Location: [facility];  Service: Urology;  Laterality: N/A;   HERNIA REPAIR     KNEE ARTHROSCOPY     SPACE OAR INSTILLATION N/A 06/23/2021   Procedure: SPACE OAR INSTILLATION;  Surgeon: Cleon Gustin, MD;  Location: [facility];  Service: Urology;  Laterality: N/A;    Home Medications:  Allergies as of 10/14/2021   No Known Allergies      Medication List        Accurate as of October 14, 2021 11:49 AM. If you have any questions, ask your nurse or doctor.          alendronate 70 MG tablet Commonly known as: FOSAMAX TAKE 1 TABLET BY MOUTH  WEEKLY TAKE WITH A FULL  GLASS OF WATER ON AN EMPTY  STOMACH   calcium carbonate 1500 (600 Ca) MG Tabs tablet Commonly known as: OSCAL Take 600 mg of elemental calcium by mouth daily with breakfast.   Eliquis 5 MG Tabs tablet Generic drug: apixaban Take 1 tablet by mouth twice daily   losartan 25 MG tablet Commonly known as: COZAAR Take 1 tablet by mouth once daily   metoprolol succinate 50 MG 24 hr  tablet Commonly known as: TOPROL-XL Take 1 tablet by mouth once daily   rosuvastatin 5 MG tablet Commonly known as: CRESTOR Take 1 tablet by mouth daily   tadalafil 20 MG tablet Commonly known as: CIALIS Take 1 tablet (20 mg total) by mouth as needed for erectile dysfunction.   terazosin 2 MG capsule Commonly known as: HYTRIN Take 1 capsule (2 mg total) by mouth at bedtime.   triamcinolone cream 0.1 % Commonly known as: KENALOG Apply 1 application topically 2 (two) times daily. What changed:  when to take this reasons to take this   Vitamin D3 25 MCG (1000 UT) Caps Take 3,000 Units by mouth daily.        Allergies: No Known Allergies  Family History: Family History  Problem Relation Age of Onset   Diabetes Mother    Hypertension Mother    Early death Father    Arthritis Maternal Grandmother    Hypertension Maternal Grandfather    Hypertension Paternal Grandmother    Cancer Paternal Grandfather        lung    Social History:  reports that he has never smoked. He has never used smokeless tobacco. He reports current alcohol use. He reports that he does not use drugs.  ROS: All other review of  systems were reviewed and are negative except what is noted above in HPI  Physical Exam: BP 115/77    Pulse 90   Constitutional:  Alert and oriented, No acute distress. HEENT: [place] AT, moist mucus membranes.  Trachea midline, no masses. Cardiovascular: No clubbing, cyanosis, or edema. Respiratory: Normal respiratory effort, no increased work of breathing. GI: Abdomen is soft, nontender, nondistended, no abdominal masses GU: No CVA tenderness.  Lymph: No cervical or inguinal lymphadenopathy. Skin: No rashes, bruises or suspicious lesions. Neurologic: Grossly intact, no focal deficits, moving all 4 extremities. Psychiatric: Normal mood and affect.  Laboratory Data: Lab Results  Component Value Date   WBC 5.6 10/10/2021   HGB 14.1 10/10/2021   HCT 39.8 10/10/2021    MCV 90 10/10/2021   PLT 199 10/10/2021    Lab Results  Component Value Date   CREATININE 0.80 10/10/2021    No results found for: PSA  No results found for: TESTOSTERONE  No results found for: HGBA1C  Urinalysis    Component Value Date/Time   APPEARANCEUR Clear 07/06/2021 1501   GLUCOSEU Negative 07/06/2021 1501   BILIRUBINUR Negative 07/06/2021 1501   PROTEINUR Negative 07/06/2021 1501   NITRITE Negative 07/06/2021 1501   LEUKOCYTESUR Negative 07/06/2021 1501    Lab Results  Component Value Date   LABMICR Comment 07/06/2021    Pertinent Imaging:  No results found for this or any previous visit.  No results found for this or any previous visit.  No results found for this or any previous visit.  No results found for this or any previous visit.  No results found for this or any previous visit.  No results found for this or any previous visit.  No results found for this or any previous visit.  No results found for this or any previous visit.   Assessment & Plan:    1. Prostate cancer ([place]) -RTC 6 months with PSA - Urinalysis, Routine w reflex microscopic  2. Benign prostatic hyperplasia with urinary obstruction -continue terazosin 2mg  daily - BLADDER SCAN AMB NON-IMAGING  3. Weak urinary stream -continue terazosin 2mg  daily   No follow-ups on file.  Nicolette Bang, MD  [facility] [place]

## 2021-10-14 NOTE — Patient Instructions (Signed)
Prostate Cancer The prostate is a small gland that produces fluid that makes up semen (seminal fluid). It is located below the bladder in men, in front of the rectum. Prostate cancer is the abnormal growth of cells in the prostate gland. What are the causes? The exact cause of this condition is not known. What increases the risk? You are more likely to develop this condition if: You are 76 years of age or older. You have a family history of prostate cancer. You have a family history of breast and ovarian cancer. You have genes that are passed from parent to child (inherited), such as BRCA1 and BRCA2. You have Lynch syndrome. African American men and men of African descent are diagnosed with prostate cancer at higher rates than other men. The reasons for this are not well understood and are likely due to a combination of genetic and environmental factors. What are the signs or symptoms? Symptoms of this condition include: Problems with urination. This may include: A weak or interrupted flow of urine. Trouble starting or stopping urination. Trouble emptying the bladder all the way. The need to urinate more often, especially at night. Blood in urine or semen. Persistent pain or discomfort in the lower back, lower abdomen, or hips. Trouble getting an erection. Weakness or numbness in the legs or feet. How is this diagnosed? This condition can be diagnosed with: A digital rectal exam. For this exam, a health care provider inserts a gloved finger into the rectum to feel the prostate gland. A blood test called a prostate-specific antigen (PSA) test. A procedure in which a sample of tissue is taken from the prostate and checked under a microscope (prostate biopsy). An imaging test called transrectal ultrasonography. Once the condition is diagnosed, tests will be done to determine how far the cancer has spread. This is called staging the cancer. Staging may involve imaging tests, such as a bone  scan, CT scan, PET scan, or MRI. Stages of prostate cancer The stages of prostate cancer are as follows: Stage 1 (I). At this stage, the cancer is found in the prostate only. The cancer is not visible on imaging tests, and it is usually found by accident, such as during prostate surgery. Stage 2 (II). At this stage, the cancer is more advanced than it is in stage 1, but the cancer has not spread outside the prostate. Stage 3 (III). At this stage, the cancer has spread beyond the outer layer of the prostate to nearby tissues. The cancer may be found in the seminal vesicles, which are near the bladder and the prostate. Stage 4 (IV). At this stage, the cancer has spread to other parts of the body, such as the lymph nodes, bones, bladder, rectum, liver, or lungs. Prostate cancer grading Prostate cancer is also graded according to how the cancer cells look under a microscope. This is called the Gleason score and the total score can range from 6-10, indicating how likely it is that the cancer will spread (metastasize) to other parts of the body. The higher the score, the greater the likelihood that the cancer will spread. Gleason 6 or lower: This indicates that the cancer cells look similar to normal prostate cells (well differentiated). Gleason 7: This indicates that the cancer cells look somewhat similar to normal prostate cells (moderately differentiated). Gleason 8, 9, or 10: This indicates that the cancer cells look very different than normal prostate cells (poorly differentiated). How is this treated? Treatment for this condition depends on several factors,  including the stage of the cancer, your age, personal preferences, and your overall health. Talk with your health care provider about treatment options that are recommended for you. Common treatments include: °Observation for early stage prostate cancer (active surveillance). This involves having exams, blood tests, and in some cases, more biopsies.  For some men, this is the only treatment needed. °Surgery. Types of surgeries include: °Open surgery (radical prostatectomy). In this surgery, a larger incision is made to remove the prostate. °A laparoscopic radical prostatectomy. This is a surgery to remove the prostate and lymph nodes through several small incisions. It is often referred to as a minimally invasive surgery. °A robotic radical prostatectomy. This is laparoscopic surgery to remove the prostate and lymph nodes with the help of robotic arms that are controlled by the surgeon. °Cryoablation. This is surgery to freeze and destroy cancer cells. °Radiation treatment. Types of radiation treatment include: °External beam radiation. This type aims beams of radiation from outside the body at the prostate to destroy cancerous cells. °Brachytherapy. This type uses radioactive needles, seeds, wires, or tubes that are implanted into the prostate gland. Like external beam radiation, brachytherapy destroys cancerous cells. An advantage is that this type of radiation limits the damage to surrounding tissue and has fewer side effects. °Chemotherapy. This treatment kills cancer cells or stops them from multiplying. It kills both cancer cells and normal cells. °Targeted therapy. This treatment uses medicines to kill cancer cells without damaging normal cells. °Hormone treatment. This treatment involves taking medicines that act on testosterone, one of the male hormones, by: °Stopping your body from producing testosterone. °Blocking testosterone from reaching cancer cells. °Follow these instructions at home: °Lifestyle °Do not use any products that contain nicotine or tobacco. These products include cigarettes, chewing tobacco, and vaping devices, such as e-cigarettes. If you need help quitting, ask your health care provider. °Eat a healthy diet. To do this: °Eat foods that are high in fiber. These include beans, whole grains, and fresh fruits and vegetables. °Limit  foods that are high in fat and sugar. These include fried or sweet foods. °Treatment for prostate cancer may affect sexual function. If you have a partner, continue to have intimate moments. This may include touching, holding, hugging, and caressing your partner. °Get plenty of sleep. °Consider joining a support group for men who have prostate cancer. Meeting with a support group may help you learn to manage the stress of having cancer. °General instructions °Take over-the-counter and prescription medicines only as told by your health care provider. °If you have to go to the hospital, notify your cancer specialist (oncologist). °Keep all follow-up visits. This is important. °Where to find more information °American Cancer Society: www.cancer.org °American Society of Clinical Oncology: www.cancer.net °National Cancer Institute: www.cancer.gov °Contact a health care provider if: °You have new or increasing trouble urinating. °You have new or increasing blood in your urine. °You have new or increasing pain in your hips, back, or chest. °Get help right away if: °You have weakness or numbness in your legs. °You cannot control urination or your bowel movements (incontinence). °You have chills or a fever. °Summary °The prostate is a small gland that is involved in the production of semen. It is located below a man's bladder, in front of the rectum. °Prostate cancer is the abnormal growth of cells in the prostate gland. °Treatment for this condition depends on the stage of the cancer, your age, personal preferences, and your overall health. Talk with your health care provider about treatment   options that are recommended for you. °Consider joining a support group for men who have prostate cancer. Meeting with a support group may help you learn to manage the stress of having cancer. °This information is not intended to replace advice given to you by your health care provider. Make sure you discuss any questions you have with  your health care provider. °Document Revised: 11/17/2020 Document Reviewed: 11/17/2020 °Elsevier Patient Education © 2022 [employer] ° °

## 2021-11-08 DIAGNOSIS — Z23 Encounter for immunization: Secondary | ICD-10-CM | POA: Diagnosis not present

## 2021-11-11 ENCOUNTER — Other Ambulatory Visit: Payer: Self-pay | Admitting: Cardiology

## 2021-11-11 DIAGNOSIS — R000 Tachycardia, unspecified: Secondary | ICD-10-CM

## 2021-11-11 DIAGNOSIS — I4891 Unspecified atrial fibrillation: Secondary | ICD-10-CM

## 2021-11-29 ENCOUNTER — Ambulatory Visit: Payer: Medicare Other | Admitting: Cardiology

## 2021-11-29 ENCOUNTER — Encounter: Payer: Self-pay | Admitting: Cardiology

## 2021-11-29 VITALS — BP 106/72 | HR 76 | Ht 69.0 in | Wt 177.0 lb

## 2021-11-29 DIAGNOSIS — I10 Essential (primary) hypertension: Secondary | ICD-10-CM | POA: Diagnosis not present

## 2021-11-29 DIAGNOSIS — E782 Mixed hyperlipidemia: Secondary | ICD-10-CM

## 2021-11-29 DIAGNOSIS — I4891 Unspecified atrial fibrillation: Secondary | ICD-10-CM

## 2021-11-29 DIAGNOSIS — I519 Heart disease, unspecified: Secondary | ICD-10-CM | POA: Diagnosis not present

## 2021-11-29 NOTE — Addendum Note (Signed)
Addended by: Sung Amabile on: 11/29/2021 04:27 PM ? ? Modules accepted: Orders ? ?

## 2021-11-29 NOTE — Patient Instructions (Signed)
Medication Instructions:  Continue all current medications.   Labwork: none  Testing/Procedures: none  Follow-Up: 6 months   Any Other Special Instructions Will Be Listed Below (If Applicable).   If you need a refill on your cardiac medications before your next appointment, please call your pharmacy.  

## 2021-11-29 NOTE — Progress Notes (Signed)
? ? ? ?Clinical Summary ?David Schwartz is a 76 y.o.male seen today for follow up of the following medical problems.  ?  ? ?1. Afib ?- new diagnosis noted by pcp during 12/25/19 visit ?- ekg from pcp reviewed, afib rate 108 ?- CHADS 2vasc score is at least 2 (age, HTN), started on eliquis and toprol '25mg'$  ?  ? - s/p DCCV 01/29/20 to [facility] ?  ?- No recent palpitaitons ?- compliant with meds ? ?-no recent palpitaitons.  ?- compliant with meds, no bleeding on eliquis.  ? ? ?  ?  ?2. 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  ?  ?04/2020 echo LVEF 60-65% ?- compliant with meds ?- no recent SOB/DOE ? ?  ?3. Prostate cancer ?- followed by urology ?- considering radiation ?  ?4. Hyperlipidemia ?04/2021 TC 170 TG 130 HDL 44 LDL 103 ?- he is on crestor ? ?10/2021 TC 169 TG 115 HDL 54 LDL 94 ? ? ? ?David Schwartz is at [employer], sophomore. His son lives [place].  ? ?Past Medical History:  ?Diagnosis Date  ? Arthritis   ? Atrial fibrillation ([place])   ? Cancer [facility])   ? Dysrhythmia   ? Hyperlipidemia   ? Hypertension   ? ? ? ?No Known Allergies ? ? ?Current Outpatient Medications  ?Medication Sig Dispense Refill  ? alendronate (FOSAMAX) 70 MG tablet TAKE 1 TABLET BY MOUTH  WEEKLY TAKE WITH A FULL  GLASS OF WATER ON AN EMPTY  STOMACH 12 tablet 0  ? calcium carbonate (OSCAL) 1500 (600 Ca) MG TABS tablet Take 600 mg of elemental calcium by mouth daily with breakfast.    ? Cholecalciferol (VITAMIN D3) 1000 units CAPS Take 3,000 Units by mouth daily.    ? ELIQUIS 5 MG TABS tablet Take 1 tablet by mouth twice daily 60 tablet 6  ? losartan (COZAAR) 25 MG tablet Take 1 tablet by mouth once daily 90 tablet 1  ? metoprolol succinate (TOPROL-XL) 50 MG 24 hr tablet Take 1 tablet by mouth once daily 90 tablet 0  ? rosuvastatin (CRESTOR) 5 MG tablet Take 1 tablet by mouth daily 90 tablet 3  ? tadalafil (CIALIS) 20 MG tablet Take 1 tablet (20 mg  total) by mouth as needed for erectile dysfunction. 20 tablet 5  ? terazosin (HYTRIN) 2 MG capsule Take 1 capsule (2 mg total) by mouth at bedtime. 90 capsule 3  ? triamcinolone (KENALOG) 0.1 % Apply 1 application topically 2 (two) times daily. (Patient taking differently: Apply 1 application topically daily as needed (rash).) 80 g 3  ? ?No current facility-administered medications for this visit.  ? ? ? ?Past Surgical History:  ?Procedure Laterality Date  ? CARDIOVERSION N/A 01/29/2020  ? Procedure: CARDIOVERSION;  Surgeon: Satira Sark, MD;  Location: [facility];  Service: Cardiovascular;  Laterality: N/A;  ? GOLD SEED IMPLANT N/A 06/23/2021  ? Procedure: GOLD SEED IMPLANT;  Surgeon: Cleon Gustin, MD;  Location: [facility];  Service: Urology;  Laterality: N/A;  ? HERNIA REPAIR    ? KNEE ARTHROSCOPY    ? SPACE OAR INSTILLATION N/A 06/23/2021  ? Procedure: SPACE OAR INSTILLATION;  Surgeon: Cleon Gustin, MD;  Location: [facility];  Service: Urology;  Laterality: N/A;  ? ? ? ?No Known Allergies ? ? ? ?Family History  ?Problem Relation Age of Onset  ? Diabetes Mother   ? Hypertension  Mother   ? Early death Father   ? Arthritis Maternal Grandmother   ? Hypertension Maternal Grandfather   ? Hypertension Paternal Grandmother   ? Cancer Paternal Grandfather   ?     lung  ? ? ? ?Social History ?[name] reports that he has never smoked. He has never used smokeless tobacco. ?[name] reports current alcohol use. ? ? ?Review of Systems ?CONSTITUTIONAL: No weight loss, fever, chills, weakness or fatigue.  ?HEENT: Eyes: No visual loss, blurred vision, double vision or yellow sclerae.No hearing loss, sneezing, congestion, runny nose or sore throat.  ?SKIN: No rash or itching.  ?CARDIOVASCULAR: per hpi ?RESPIRATORY: No shortness of breath, cough or sputum.  ?GASTROINTESTINAL: No anorexia, nausea, vomiting or diarrhea. No abdominal pain or blood.  ?GENITOURINARY: No burning on urination, no polyuria ?NEUROLOGICAL: No  headache, dizziness, syncope, paralysis, ataxia, numbness or tingling in the extremities. No change in bowel or bladder control.  ?MUSCULOSKELETAL: No muscle, back pain, joint pain or stiffness.  ?LYMPHATICS: No enlarged nodes. No history of splenectomy.  ?PSYCHIATRIC: No history of depression or anxiety.  ?ENDOCRINOLOGIC: No reports of sweating, cold or heat intolerance. No polyuria or polydipsia.  ?. ? ? ?Physical Examination ?Today's Vitals  ? 11/29/21 1255  ?BP: 106/72  ?Pulse: 76  ?SpO2: 99%  ?Weight: 177 lb (80.3 kg)  ?Height: '5\' 9"'$  (1.753 m)  ? ?Body mass index is 26.14 kg/m?. ? ?Gen: resting comfortably, no acute distress ?HEENT: no scleral icterus, pupils equal round and reactive, no palptable cervical adenopathy,  ?CV: irreg, no m/r/g no jvd ?Resp: Clear to auscultation bilaterally ?GI: abdomen is soft, non-tender, non-distended, normal bowel sounds, no hepatosplenomegaly ?MSK: extremities are warm, no edema.  ?Skin: warm, no rash ?Neuro:  no focal deficits ?Psych: appropriate affect ? ? ?Diagnostic Studies ? ?01/2020 echo ?  ?IMPRESSIONS  ? ? ? 1. Left ventricular ejection fraction, by estimation, is 35 to 40%. The  ?left ventricle has moderately decreased function. The left ventricle  ?demonstrates global hypokinesis. There is mild left ventricular  ?hypertrophy of the posterior segment. Left  ?ventricular diastolic parameters are indeterminate.  ? 2. Right ventricular systolic function is normal. The right ventricular  ?size is normal. There is normal pulmonary artery systolic pressure.  ? 3. Left atrial size was moderately dilated.  ? 4. Right atrial size was mildly dilated.  ? 5. The mitral valve is degenerative. Mild to moderate mitral valve  ?regurgitation.  ? 6. Tricuspid valve regurgitation is moderate.  ? 7. The aortic valve is tricuspid. Aortic valve regurgitation is mild. No  ?aortic stenosis is present.  ? 8. The inferior vena cava is normal in size with greater than 50%  ?respiratory  variability, suggesting right atrial pressure of 3 mmHg.  ?  ?04/2020 echo ?  ?IMPRESSIONS  ? ? ? 1. Left ventricular ejection fraction, by estimation, is 60 to 65%. The  ?left ventricle has normal function. The left ventricle has no regional  ?wall motion abnormalities. Left ventricular diastolic parameters are  ?consistent with Grade I diastolic  ?dysfunction (impaired relaxation).  ? 2. Right ventricular systolic function is normal. The right ventricular  ?size is normal. There is normal pulmonary artery systolic pressure.  ? 3. Left atrial size was moderately dilated.  ? 4. The mitral valve is normal in structure. No evidence of mitral valve  ?regurgitation. No evidence of mitral stenosis.  ? 5. The aortic valve is tricuspid. Aortic valve regurgitation is mild. No  ?aortic stenosis is present.  ?  6. The inferior vena cava is normal in size with greater than 50%  ?respiratory variability, suggesting right atrial pressure of 3 mmHg.  ?  ? ? ?Assessment and Plan  ? ?1. Afib ?- new diagnosis by pcp 12/25/19 ?- did very well after cardioversion 01/2020, today is first noted recurrence of afib ?- with no symptoms and rate controlled no reason to repeat cardioversion at this time.  ?  ?2. 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  ?3. HTN ? - bp is at goal, continue current meds ?  ?4. Hyperlipidemia ?- at goal, continue current meds ? ? ? ? ?Arnoldo Lenis, M.D ?

## 2021-12-13 ENCOUNTER — Ambulatory Visit (INDEPENDENT_AMBULATORY_CARE_PROVIDER_SITE_OTHER): Payer: Medicare Other | Admitting: Nurse Practitioner

## 2021-12-13 ENCOUNTER — Encounter: Payer: Self-pay | Admitting: Nurse Practitioner

## 2021-12-13 VITALS — BP 121/70 | HR 94 | Temp 98.1°F | Ht 69.0 in | Wt 176.0 lb

## 2021-12-13 DIAGNOSIS — L02511 Cutaneous abscess of right hand: Secondary | ICD-10-CM

## 2021-12-13 MED ORDER — CEPHALEXIN 500 MG PO CAPS: 500.0000 mg | ORAL_CAPSULE | Freq: Two times a day (BID) | ORAL | 0 refills | Status: DC

## 2021-12-13 NOTE — Progress Notes (Signed)
? ?Acute Office Visit ? ?Subjective:  ? ? Patient ID: David Schwartz, male    DOB: Oct 06, 1945, 76 y.o.   MRN: [id number] ? ?Chief Complaint  ?Patient presents with  ? Blister  ? ? ?HPI ?Patient is in today for blister on left ring finger.  Symptoms present for the past week.  Erythema, tenderness and  hardened skin present.  No fever or other symptoms associated with current complaint. ? ?Past Medical History:  ?Diagnosis Date  ? Arthritis   ? Atrial fibrillation ([place])   ? Cancer [facility])   ? Dysrhythmia   ? Hyperlipidemia   ? Hypertension   ? ? ?Past Surgical History:  ?Procedure Laterality Date  ? CARDIOVERSION N/A 01/29/2020  ? Procedure: CARDIOVERSION;  Surgeon: Satira Sark, MD;  Location: [facility];  Service: Cardiovascular;  Laterality: N/A;  ? GOLD SEED IMPLANT N/A 06/23/2021  ? Procedure: GOLD SEED IMPLANT;  Surgeon: Cleon Gustin, MD;  Location: [facility];  Service: Urology;  Laterality: N/A;  ? HERNIA REPAIR    ? KNEE ARTHROSCOPY    ? SPACE OAR INSTILLATION N/A 06/23/2021  ? Procedure: SPACE OAR INSTILLATION;  Surgeon: Cleon Gustin, MD;  Location: [facility];  Service: Urology;  Laterality: N/A;  ? ? ?Family History  ?Problem Relation Age of Onset  ? Diabetes Mother   ? Hypertension Mother   ? Early death Father   ? Arthritis Maternal Grandmother   ? Hypertension Maternal Grandfather   ? Hypertension Paternal Grandmother   ? Cancer Paternal Grandfather   ?     lung  ? ? ?Social History  ? ?Socioeconomic History  ? Marital status: Married  ?  Spouse name: Katharine Look  ? Number of children: Not on file  ? Years of education: 41  ? Highest education level: 12th grade  ?Occupational History  ? Occupation: Retired  ?Tobacco Use  ? Smoking status: Never  ? Smokeless tobacco: Never  ?Vaping Use  ? Vaping Use: Never used  ?Substance and Sexual Activity  ? Alcohol use: Yes  ?  Comment: occ  ? Drug use: Never  ? Sexual activity: Not on file  ?Other Topics Concern  ? Not on file  ?Social History Narrative  ? Living  with wife - son lives nearby  ? ?Social Determinants of Health  ? ?Financial Resource Strain: Low Risk   ? Difficulty of Paying Living Expenses: Not hard at all  ?Food Insecurity: No Food Insecurity  ? Worried About Charity fundraiser in the Last Year: Never true  ? Ran Out of Food in the Last Year: Never true  ?Transportation Needs: No Transportation Needs  ? Lack of Transportation (Medical): No  ? Lack of Transportation (Non-Medical): No  ?Physical Activity: Insufficiently Active  ? Days of Exercise per Week: 7 days  ? Minutes of Exercise per Session: 20 min  ?Stress: No Stress Concern Present  ? Feeling of Stress : Not at all  ?Social Connections: Socially Integrated  ? Frequency of Communication with Friends and Family: More than three times a week  ? Frequency of Social Gatherings with Friends and Family: More than three times a week  ? Attends Religious Services: More than 4 times per year  ? Active Member of Clubs or Organizations: Yes  ? Attends Archivist Meetings: 1 to 4 times per year  ? Marital Status: Married  ?Intimate Partner Violence: Not At Risk  ? Fear of Current or Ex-Partner: No  ? Emotionally Abused:  No  ? Physically Abused: No  ? Sexually Abused: No  ? ? ?Outpatient Medications Prior to Visit  ?Medication Sig Dispense Refill  ? alendronate (FOSAMAX) 70 MG tablet TAKE 1 TABLET BY MOUTH  WEEKLY TAKE WITH A FULL  GLASS OF WATER ON AN EMPTY  STOMACH 12 tablet 0  ? calcium carbonate (OSCAL) 1500 (600 Ca) MG TABS tablet Take 600 mg of elemental calcium by mouth daily with breakfast.    ? Cholecalciferol (VITAMIN D3) 1000 units CAPS Take 3,000 Units by mouth daily.    ? ELIQUIS 5 MG TABS tablet Take 1 tablet by mouth twice daily 60 tablet 6  ? losartan (COZAAR) 25 MG tablet Take 1 tablet by mouth once daily 90 tablet 1  ? metoprolol succinate (TOPROL-XL) 50 MG 24 hr tablet Take 1 tablet by mouth once daily 90 tablet 0  ? rosuvastatin (CRESTOR) 5 MG tablet Take 1 tablet by mouth daily 90  tablet 3  ? tadalafil (CIALIS) 20 MG tablet Take 1 tablet (20 mg total) by mouth as needed for erectile dysfunction. 20 tablet 5  ? terazosin (HYTRIN) 2 MG capsule Take 1 capsule (2 mg total) by mouth at bedtime. 90 capsule 3  ? triamcinolone (KENALOG) 0.1 % Apply 1 application topically 2 (two) times daily. (Patient taking differently: Apply 1 application. topically daily as needed (rash).) 80 g 3  ? ?No facility-administered medications prior to visit.  ? ? ?No Known Allergies ? ?Review of Systems  ?Constitutional: Negative.   ?HENT: Negative.    ?Eyes: Negative.   ?Respiratory: Negative.    ?Cardiovascular: Negative.   ?Gastrointestinal: Negative.   ?Skin:  Negative for rash.  ?     Blister left ring finger  ?All other systems reviewed and are negative. ? ?   ?Objective:  ?  ?Physical Exam ?Vitals and nursing note reviewed.  ?Constitutional:   ?   Appearance: Normal appearance.  ?HENT:  ?   Head: Normocephalic.  ?   Right Ear: External ear normal.  ?   Left Ear: External ear normal.  ?   Nose: Nose normal.  ?   Mouth/Throat:  ?   Mouth: Mucous membranes are moist.  ?   Pharynx: Oropharynx is clear.  ?Cardiovascular:  ?   Pulses: Normal pulses.  ?Abdominal:  ?   General: Bowel sounds are normal.  ?Musculoskeletal:  ?   Left hand: Tenderness present. No swelling. Normal range of motion.  ?     Arms: ? ?   Comments: Blister left ring finger  ?Skin: ?   General: Skin is warm.  ?   Findings: Erythema present.  ?Neurological:  ?   Mental Status: He is alert and oriented to person, place, and time.  ?Psychiatric:     ?   Behavior: Behavior normal.  ? ? ?BP 121/70   Pulse 94   Temp 98.1 ?F (36.7 ?C)   Ht '5\' 9"'  (1.753 m)   Wt 176 lb (79.8 kg)   SpO2 98%   BMI 25.99 kg/m?  ?Wt Readings from Last 3 Encounters:  ?12/13/21 176 lb (79.8 kg)  ?11/29/21 177 lb (80.3 kg)  ?10/10/21 171 lb (77.6 kg)  ? ? ?Health Maintenance Due  ?Topic Date Due  ? COVID-19 Vaccine (3 - Moderna risk series) 12/13/2019  ? ? ?There are no  preventive care reminders to display for this patient. ? ? ?No results found for: TSH ?Lab Results  ?Component Value Date  ? WBC 5.6 10/10/2021  ?  HGB 14.1 10/10/2021  ? HCT 39.8 10/10/2021  ? MCV 90 10/10/2021  ? PLT 199 10/10/2021  ? ?Lab Results  ?Component Value Date  ? NA 143 10/10/2021  ? K 4.8 10/10/2021  ? CO2 24 10/10/2021  ? GLUCOSE 95 10/10/2021  ? BUN 17 10/10/2021  ? CREATININE 0.80 10/10/2021  ? BILITOT 0.6 10/10/2021  ? ALKPHOS 46 10/10/2021  ? AST 19 10/10/2021  ? ALT 12 10/10/2021  ? PROT 6.5 10/10/2021  ? ALBUMIN 4.2 10/10/2021  ? CALCIUM 9.3 10/10/2021  ? ANIONGAP 11 01/27/2020  ? EGFR 92 10/10/2021  ? ?Lab Results  ?Component Value Date  ? CHOL 169 10/10/2021  ? ?Lab Results  ?Component Value Date  ? HDL 54 10/10/2021  ? ?Lab Results  ?Component Value Date  ? [place] 94 10/10/2021  ? ?Lab Results  ?Component Value Date  ? TRIG 115 10/10/2021  ? ?Lab Results  ?Component Value Date  ? CHOLHDL 3.1 10/10/2021  ? ?No results found for: HGBA1C ? ?   ?Assessment & Plan:  ?Fluid on the need for hard skin pacing palpated. ?-Educated patient not to open blister, ?-Warm compresses ?-Tylenol/ibuprofen for pain as tolerated ?-Return in 5 to 7 days if blister worsens or gets infected. ?-Prophylactically treating patient with antibiotics ? ?Problem List Items Addressed This Visit   ?None ?Visit Diagnoses   ? ? Abscess of finger of right hand    -  Primary  ? Relevant Medications  ? cephALEXin (KEFLEX) 500 MG capsule  ? ?  ? ? ? ?Meds ordered this encounter  ?Medications  ? cephALEXin (KEFLEX) 500 MG capsule  ?  Sig: Take 1 capsule (500 mg total) by mouth 2 (two) times daily.  ?  Dispense:  14 capsule  ?  Refill:  0  ?  Order Specific Question:   Supervising Provider  ?  AnswerClaretta Fraise [[id number]]  ? ? ? ?Ivy Lynn, NP ? ?

## 2021-12-13 NOTE — Patient Instructions (Signed)
Blisters, Adult  A blister is a raised bubble of skin filled with liquid. Blisters often develop on skin that rubs or presses against another surface repeatedly (friction blister). Friction blisters can occur on any part of the body, but they usually form on the hands or the feet. Long-term pressure on that same area of skin can also cause the area of skin to become hardened. This hardened skin is called acallus. What are the causes? Besides friction, blisters can be caused by: An injury, such as a burn. An allergic reaction. An infection. Exposure to irritating chemicals. Friction blisters often occur in areas with a lot of heat and moisture. Friction blisters often result from: Sports. Repetitive activities. Using tools and doing other activities without wearing gloves. Shoes that are too tight or too loose. What are the signs or symptoms? A blister is often round and looks like a bump. It may hurt or feel itchy. Before a blister forms, your skin may: Turn red. Feel warm. Itch. Be painful to the touch. How is this diagnosed? A blister is diagnosed with a physical exam. How is this treated? Treatment usually involves protecting the area where the blister has formed until your skin has healed. Treatments may include: Using a bandage (dressing) to cover your blister. Putting extra padding around and over your blister so that the blister does not rub on anything. Applying antibiotic ointment. Most blisters break open, dry up, and go away on their own within 1-2 weeks. Blisters that are very painful may be drained before they break open on their own. If your blister is large or painful, your health care provider can drainit. Follow these instructions at home: Medicines Take or apply over-the-counter and prescription medicines only as told by your health care provider. If you were prescribed an antibiotic medicine, take or apply it as told by your health care provider. Do not stop using  the antibiotic even if you start to feel better. Skin care Do not pop your blister. This can cause infection. Keep your blister clean and dry. This helps to prevent infection. Before you swim or use a hot tub, cover your blister with a waterproof dressing. Protect the area where your blister has formed as told by your health care provider. Follow instructions from your health care provider about how to take care of your blister. Make sure you: Wash your hands with soap and water for at least 20 seconds before and after you change your dressing. If soap and water are not available, use hand sanitizer. Change your dressing as told by your health care provider. Infection signs Check your blister every day for signs of infection. Check for: More redness, swelling, or pain. More fluid or blood. Warmth. Pus or a bad smell. General instructions If you have a blister on a foot or toe, wear different shoes until your blister heals. Avoid the activity that caused the blister until your blister heals. Keep all follow-up visits as told by your health care provider. This is important. How is this prevented? Taking these steps can help to prevent blisters that are caused by friction. Make sure you: Wear comfortable shoes that fit well. Always wear socks with shoes. Wear extra socks or use tape, dressings, or pads over blister-prone areas as needed. You may also apply petroleum jelly under dressings in blister-prone areas. Wear protective gear, such as gloves, when taking part in sports or activities that can cause blisters. Wear loose-fitting, moisture-wicking clothes when taking part in sports or activities.   Use powders as needed to keep your feet dry. Contact a health care provider if: You have more redness, swelling, or pain around your blister. You have more fluid or blood coming from your blister. Your blister feels warm to the touch. You have pus or a bad smell coming from your blister. You  have a fever or chills. Your blister gets better and then it gets worse. Summary A blister is a raised bubble of skin filled with liquid. Blisters often develop on skin that rubs or presses against another surface repeatedly (friction blister). Most blisters break open, dry up, and go away on their own within 1-2 weeks. Keep your blister clean and dry. This helps to prevent infection. Take steps to help prevent blisters that are caused by friction. Contact a health care provider if you have signs of infection. This information is not intended to replace advice given to you by your health care provider. Make sure you discuss any questions you have with your healthcare provider. Document Revised: 07/10/2019 Document Reviewed: 07/10/2019 Elsevier Patient Education  2022 Elsevier Inc.  

## 2021-12-19 ENCOUNTER — Encounter: Payer: Self-pay | Admitting: Family Medicine

## 2021-12-19 ENCOUNTER — Ambulatory Visit (INDEPENDENT_AMBULATORY_CARE_PROVIDER_SITE_OTHER): Payer: Medicare Other | Admitting: Family Medicine

## 2021-12-19 VITALS — BP 115/70 | HR 96 | Temp 97.3°F | Ht 69.0 in | Wt 176.4 lb

## 2021-12-19 DIAGNOSIS — H6121 Impacted cerumen, right ear: Secondary | ICD-10-CM

## 2021-12-19 DIAGNOSIS — L02511 Cutaneous abscess of right hand: Secondary | ICD-10-CM

## 2021-12-19 NOTE — Patient Instructions (Signed)

## 2021-12-19 NOTE — Progress Notes (Signed)
? ?Acute Office Visit ? ?Subjective:  ? ? Patient ID: David Schwartz, male    DOB: 09/17/1945, 76 y.o.   MRN: [id number] ? ?Chief Complaint  ?Patient presents with  ? Blister  ? ? ?HPI ?Patient is in today for follow up of right ring finger abscess. He has been taking keflex as prescribed for this. He reports that erythema and tenderness has resolved. Denies drainage, fever, or chills.  ? ?He will be going to the audiologist soon. He would like to have his ears checked and flushed if needed. Denies pain, fever, or drainage.  ? ?Past Medical History:  ?Diagnosis Date  ? Arthritis   ? Atrial fibrillation ([place])   ? Cancer [facility])   ? Dysrhythmia   ? Hyperlipidemia   ? Hypertension   ? ? ?Past Surgical History:  ?Procedure Laterality Date  ? CARDIOVERSION N/A 01/29/2020  ? Procedure: CARDIOVERSION;  Surgeon: Satira Sark, MD;  Location: [facility];  Service: Cardiovascular;  Laterality: N/A;  ? GOLD SEED IMPLANT N/A 06/23/2021  ? Procedure: GOLD SEED IMPLANT;  Surgeon: Cleon Gustin, MD;  Location: [facility];  Service: Urology;  Laterality: N/A;  ? HERNIA REPAIR    ? KNEE ARTHROSCOPY    ? SPACE OAR INSTILLATION N/A 06/23/2021  ? Procedure: SPACE OAR INSTILLATION;  Surgeon: Cleon Gustin, MD;  Location: [facility];  Service: Urology;  Laterality: N/A;  ? ? ?Family History  ?Problem Relation Age of Onset  ? Diabetes Mother   ? Hypertension Mother   ? Early death Father   ? Arthritis Maternal Grandmother   ? Hypertension Maternal Grandfather   ? Hypertension Paternal Grandmother   ? Cancer Paternal Grandfather   ?     lung  ? ? ?Social History  ? ?Socioeconomic History  ? Marital status: Married  ?  Spouse name: Katharine Look  ? Number of children: Not on file  ? Years of education: 75  ? Highest education level: 12th grade  ?Occupational History  ? Occupation: Retired  ?Tobacco Use  ? Smoking status: Never  ? Smokeless tobacco: Never  ?Vaping Use  ? Vaping Use: Never used  ?Substance and Sexual Activity  ? Alcohol use: Yes  ?   Comment: occ  ? Drug use: Never  ? Sexual activity: Not on file  ?Other Topics Concern  ? Not on file  ?Social History Narrative  ? Living with wife - son lives nearby  ? ?Social Determinants of Health  ? ?Financial Resource Strain: Low Risk   ? Difficulty of Paying Living Expenses: Not hard at all  ?Food Insecurity: No Food Insecurity  ? Worried About Charity fundraiser in the Last Year: Never true  ? Ran Out of Food in the Last Year: Never true  ?Transportation Needs: No Transportation Needs  ? Lack of Transportation (Medical): No  ? Lack of Transportation (Non-Medical): No  ?Physical Activity: Insufficiently Active  ? Days of Exercise per Week: 7 days  ? Minutes of Exercise per Session: 20 min  ?Stress: No Stress Concern Present  ? Feeling of Stress : Not at all  ?Social Connections: Socially Integrated  ? Frequency of Communication with Friends and Family: More than three times a week  ? Frequency of Social Gatherings with Friends and Family: More than three times a week  ? Attends Religious Services: More than 4 times per year  ? Active Member of Clubs or Organizations: Yes  ? Attends Archivist Meetings: 1 to 4  times per year  ? Marital Status: Married  ?Intimate Partner Violence: Not At Risk  ? Fear of Current or Ex-Partner: No  ? Emotionally Abused: No  ? Physically Abused: No  ? Sexually Abused: No  ? ? ?Outpatient Medications Prior to Visit  ?Medication Sig Dispense Refill  ? alendronate (FOSAMAX) 70 MG tablet TAKE 1 TABLET BY MOUTH  WEEKLY TAKE WITH A FULL  GLASS OF WATER ON AN EMPTY  STOMACH 12 tablet 0  ? calcium carbonate (OSCAL) 1500 (600 Ca) MG TABS tablet Take 600 mg of elemental calcium by mouth daily with breakfast.    ? cephALEXin (KEFLEX) 500 MG capsule Take 1 capsule (500 mg total) by mouth 2 (two) times daily. 14 capsule 0  ? Cholecalciferol (VITAMIN D3) 1000 units CAPS Take 3,000 Units by mouth daily.    ? ELIQUIS 5 MG TABS tablet Take 1 tablet by mouth twice daily 60 tablet 6  ?  losartan (COZAAR) 25 MG tablet Take 1 tablet by mouth once daily 90 tablet 1  ? metoprolol succinate (TOPROL-XL) 50 MG 24 hr tablet Take 1 tablet by mouth once daily 90 tablet 0  ? rosuvastatin (CRESTOR) 5 MG tablet Take 1 tablet by mouth daily 90 tablet 3  ? tadalafil (CIALIS) 20 MG tablet Take 1 tablet (20 mg total) by mouth as needed for erectile dysfunction. 20 tablet 5  ? terazosin (HYTRIN) 2 MG capsule Take 1 capsule (2 mg total) by mouth at bedtime. 90 capsule 3  ? triamcinolone (KENALOG) 0.1 % Apply 1 application topically 2 (two) times daily. (Patient taking differently: Apply 1 application. topically daily as needed (rash).) 80 g 3  ? ?No facility-administered medications prior to visit.  ? ? ?No Known Allergies ? ?Review of Systems ?As per HPI. ?   ?Objective:  ?  ?Physical Exam ?Vitals and nursing note reviewed.  ?Constitutional:   ?   General: He is not in acute distress. ?   Appearance: He is not ill-appearing, toxic-appearing or diaphoretic.  ?Pulmonary:  ?   Effort: Pulmonary effort is normal. No respiratory distress.  ?Musculoskeletal:  ?   Right hand: No swelling, tenderness or bony tenderness. Normal range of motion.  ?Skin: ?   General: Skin is warm and dry.  ?Neurological:  ?   General: No focal deficit present.  ?   Mental Status: He is alert and oriented to person, place, and time.  ?Psychiatric:     ?   Mood and Affect: Mood normal.     ?   Behavior: Behavior normal.     ?   Thought Content: Thought content normal.     ?   Judgment: Judgment normal.  ? ?Ear Cerumen Removal ? ?Date/Time: 12/19/2021 8:40 AM ?Performed by: Gwenlyn Perking, FNP ?Authorized by: Gwenlyn Perking, FNP  ? ?Anesthesia: ?Local Anesthetic: none ?Location details: right ear ?Patient tolerance: patient tolerated the procedure well with no immediate complications ?Procedure type: irrigation  ?Sedation: ?Patient sedated: no ? ? ? ? ? ?BP 115/70   Pulse 96   Temp (!) 97.3 ?F (36.3 ?C) (Temporal)   Ht _0  (1.753 m)    Wt 176 lb 6 oz (80 kg)   BMI 26.05 kg/m?  ?Wt Readings from Last 3 Encounters:  ?12/19/21 176 lb 6 oz (80 kg)  ?12/13/21 176 lb (79.8 kg)  ?11/29/21 177 lb (80.3 kg)  ? ? ?Health Maintenance Due  ?Topic Date Due  ? COVID-19 Vaccine (3 - Moderna risk series)  12/13/2019  ? ? ?There are no preventive care reminders to display for this patient. ? ? ?No results found for: TSH ?Lab Results  ?Component Value Date  ? WBC 5.6 10/10/2021  ? HGB 14.1 10/10/2021  ? HCT 39.8 10/10/2021  ? MCV 90 10/10/2021  ? PLT 199 10/10/2021  ? ?Lab Results  ?Component Value Date  ? NA 143 10/10/2021  ? K 4.8 10/10/2021  ? CO2 24 10/10/2021  ? GLUCOSE 95 10/10/2021  ? BUN 17 10/10/2021  ? CREATININE 0.80 10/10/2021  ? BILITOT 0.6 10/10/2021  ? ALKPHOS 46 10/10/2021  ? AST 19 10/10/2021  ? ALT 12 10/10/2021  ? PROT 6.5 10/10/2021  ? ALBUMIN 4.2 10/10/2021  ? CALCIUM 9.3 10/10/2021  ? ANIONGAP 11 01/27/2020  ? EGFR 92 10/10/2021  ? ?Lab Results  ?Component Value Date  ? CHOL 169 10/10/2021  ? ?Lab Results  ?Component Value Date  ? HDL 54 10/10/2021  ? ?Lab Results  ?Component Value Date  ? [place] 94 10/10/2021  ? ?Lab Results  ?Component Value Date  ? TRIG 115 10/10/2021  ? ?Lab Results  ?Component Value Date  ? CHOLHDL 3.1 10/10/2021  ? ?No results found for: HGBA1C ? ?   ?Assessment & Plan:  ? ?Hughey was seen today for blister. ? ?Diagnoses and all orders for this visit: ? ?Abscess of finger of right hand ?Improving, no signs of infection today. Complete last dose of keflex as prescribed.  ? ?Impacted cerumen of right ear ?Well tolerated today. Discussed prevention, debrox drops OTC. ?-     Ear Cerumen Removal ? ?Return if symptoms worsen or fail to improve. ? ?The patient indicates understanding of these issues and agrees with the plan. ? ?Gwenlyn Perking, FNP ? ?

## 2021-12-28 ENCOUNTER — Other Ambulatory Visit: Payer: Self-pay | Admitting: Family Medicine

## 2022-01-05 ENCOUNTER — Ambulatory Visit (INDEPENDENT_AMBULATORY_CARE_PROVIDER_SITE_OTHER): Payer: Medicare Other

## 2022-01-05 VITALS — Wt 176.0 lb

## 2022-01-05 DIAGNOSIS — Z0000 Encounter for general adult medical examination without abnormal findings: Secondary | ICD-10-CM | POA: Diagnosis not present

## 2022-01-05 NOTE — Progress Notes (Signed)
? ?Subjective:  ? David Schwartz is a 76 y.o. male who presents for Medicare Annual/Subsequent preventive examination. ? ?Virtual Visit via Telephone Note ? ?I connected with  David Schwartz on 01/05/22 at  2:00 PM EDT by telephone and verified that I am speaking with the correct person using two identifiers. ? ?Location: ?Patient: Home ?Provider: [facility] ?Persons participating in the virtual visit: patient/Nurse Health Advisor ?  ?I discussed the limitations, risks, security and privacy concerns of performing an evaluation and management service by telephone and the availability of in person appointments. The patient expressed understanding and agreed to proceed. ? ?Interactive audio and video telecommunications were attempted between this nurse and patient, however failed, due to patient having technical difficulties OR patient did not have access to video capability.  We continued and completed visit with audio only. ? ?Some vital signs may be absent or patient reported.  ? ?Lilyauna Miedema Dionne Ano, LPN  ? ?Review of Systems    ? ?Cardiac Risk Factors include: advanced age (>16mn, >>7women);family history of premature cardiovascular disease;dyslipidemia;hypertension;male gender;Other (see comment), Risk factor comments: A.Fib ? ?   ?Objective:  ?  ?Today's Vitals  ? 01/05/22 1402  ?Weight: 176 lb (79.8 kg)  ? ?Body mass index is 25.99 kg/m?. ? ? ?  01/05/2022  ?  2:07 PM 06/23/2021  ?  7:45 AM 06/21/2021  ?  1:17 PM 06/07/2021  ?  3:21 PM 06/03/2021  ?  1:06 PM 01/04/2021  ?  2:56 PM 01/29/2020  ?  9:01 AM  ?Advanced Directives  ?Does Patient Have a Medical Advance Directive? Yes No Yes Yes Yes Yes Yes  ?Type of [place]  Living will;Healthcare Power of Attorney Living will;Healthcare Power of Attorney Living will;Healthcare Power of [place]   ?Does patient want to make changes to medical advance directive?   No - Patient declined No -  Patient declined     ?[place]? No - copy requested  No - copy requested   No - copy requested No - copy requested  ?Would patient like information on creating a medical advance directive?  No - Patient declined       ? ? ?Current Medications (verified) ?Outpatient Encounter Medications as of 01/05/2022  ?Medication Sig  ? alendronate (FOSAMAX) 70 MG tablet TAKE 1 TABLET BY MOUTH WEEKLY  WITH 8 OZ OF PLAIN WATER 30  MINUTES BEFORE FIRST FOOD, DRINK OR MEDS. STAY UPRIGHT FOR 30  MINS  ? calcium carbonate (OSCAL) 1500 (600 Ca) MG TABS tablet Take 600 mg of elemental calcium by mouth daily with breakfast.  ? Cholecalciferol (VITAMIN D3) 1000 units CAPS Take 3,000 Units by mouth daily.  ? ELIQUIS 5 MG TABS tablet Take 1 tablet by mouth twice daily  ? losartan (COZAAR) 25 MG tablet Take 1 tablet by mouth once daily  ? metoprolol succinate (TOPROL-XL) 50 MG 24 hr tablet Take 1 tablet by mouth once daily  ? rosuvastatin (CRESTOR) 5 MG tablet Take 1 tablet by mouth daily  ? tadalafil (CIALIS) 20 MG tablet Take 1 tablet (20 mg total) by mouth as needed for erectile dysfunction.  ? terazosin (HYTRIN) 2 MG capsule Take 1 capsule (2 mg total) by mouth at bedtime.  ? triamcinolone (KENALOG) 0.1 % Apply 1 application topically 2 (two) times daily. (Patient taking differently: Apply 1 application. topically daily as needed (rash).)  ? [DISCONTINUED] cephALEXin (KEFLEX) 500  MG capsule Take 1 capsule (500 mg total) by mouth 2 (two) times daily.  ? ?No facility-administered encounter medications on file as of 01/05/2022.  ? ? ?Allergies (verified) ?Patient has no known allergies.  ? ?History: ?Past Medical History:  ?Diagnosis Date  ? Arthritis   ? Atrial fibrillation ([place])   ? Cancer [facility])   ? Dysrhythmia   ? Hyperlipidemia   ? Hypertension   ? ?Past Surgical History:  ?Procedure Laterality Date  ? CARDIOVERSION N/A 01/29/2020  ? Procedure: CARDIOVERSION;  Surgeon: Satira Sark, MD;  Location:  [facility];  Service: Cardiovascular;  Laterality: N/A;  ? GOLD SEED IMPLANT N/A 06/23/2021  ? Procedure: GOLD SEED IMPLANT;  Surgeon: Cleon Gustin, MD;  Location: [facility];  Service: Urology;  Laterality: N/A;  ? HERNIA REPAIR    ? KNEE ARTHROSCOPY    ? SPACE OAR INSTILLATION N/A 06/23/2021  ? Procedure: SPACE OAR INSTILLATION;  Surgeon: Cleon Gustin, MD;  Location: [facility];  Service: Urology;  Laterality: N/A;  ? ?Family History  ?Problem Relation Age of Onset  ? Diabetes Mother   ? Hypertension Mother   ? Early death Father   ? Arthritis Maternal Grandmother   ? Hypertension Maternal Grandfather   ? Hypertension Paternal Grandmother   ? Cancer Paternal Grandfather   ?     lung  ? ?Social History  ? ?Socioeconomic History  ? Marital status: Married  ?  Spouse name: Katharine Look  ? Number of children: Not on file  ? Years of education: 72  ? Highest education level: 12th grade  ?Occupational History  ? Occupation: Retired  ?Tobacco Use  ? Smoking status: Never  ? Smokeless tobacco: Never  ?Vaping Use  ? Vaping Use: Never used  ?Substance and Sexual Activity  ? Alcohol use: Yes  ?  Comment: occ  ? Drug use: Never  ? Sexual activity: Not on file  ?Other Topics Concern  ? Not on file  ?Social History Narrative  ? Living with wife - son lives nearby  ? ?Social Determinants of Health  ? ?Financial Resource Strain: Low Risk   ? Difficulty of Paying Living Expenses: Not hard at all  ?Food Insecurity: No Food Insecurity  ? Worried About Charity fundraiser in the Last Year: Never true  ? Ran Out of Food in the Last Year: Never true  ?Transportation Needs: No Transportation Needs  ? Lack of Transportation (Medical): No  ? Lack of Transportation (Non-Medical): No  ?Physical Activity: Sufficiently Active  ? Days of Exercise per Week: 7 days  ? Minutes of Exercise per Session: 30 min  ?Stress: No Stress Concern Present  ? Feeling of Stress : Not at all  ?Social Connections: Socially Integrated  ? Frequency of Communication  with Friends and Family: More than three times a week  ? Frequency of Social Gatherings with Friends and Family: More than three times a week  ? Attends Religious Services: More than 4 times per year  ? Active Member of Clubs or Organizations: Yes  ? Attends Archivist Meetings: More than 4 times per year  ? Marital Status: Married  ? ? ?Tobacco Counseling ?Counseling given: Not Answered ? ? ?Clinical Intake: ? ?Pre-visit preparation completed: Yes ? ?Pain : No/denies pain ? ?  ? ?BMI - recorded: 25.99 ?Nutritional Status: BMI 25 -29 Overweight ?Nutritional Risks: None ?Diabetes: No ? ?How often do you need to have someone help you when you read instructions, pamphlets, or  other written materials from your doctor or pharmacy?: 1 - Never ? ?Diabetic? no ? ?Interpreter Needed?: No ? ?Information entered by :: Margues Filippini, LPN ? ? ?Activities of Daily Living ? ?  01/05/2022  ?  2:07 PM 06/21/2021  ?  1:19 PM  ?In your present state of health, do you have any difficulty performing the following activities:  ?Hearing? 0   ?Vision? 0   ?Difficulty concentrating or making decisions? 0   ?Walking or climbing stairs? 0   ?Dressing or bathing? 0   ?Doing errands, shopping? 0 0  ?Preparing Food and eating ? N   ?Using the Toilet? N   ?In the past six months, have you accidently leaked urine? Y   ?Comment resolved now - prostate surgey 06/2021   ?Do you have problems with loss of bowel control? N   ?Managing your Medications? N   ?Managing your Finances? N   ?Housekeeping or managing your Housekeeping? N   ? ? ?Patient Care Team: ?Dettinger, Fransisca Kaufmann, MD as PCP - General (Family Medicine) ?Arnoldo Lenis, MD as PCP - Cardiology (Cardiology) ?Lavera Guise, [facility] (Pharmacist) ?Sandford Craze, MD as Referring Physician (Dermatology) ?McKenzie, Candee Furbish, MD as Consulting Physician (Urology) ?Arnoldo Lenis, MD as Consulting Physician (Cardiology) ?Katheren Puller, RN as Oncology Nurse Navigator ? ?Indicate any  recent Medical Services you may have received from other than [facility] providers in the past year (date may be approximate). ? ?   ?Assessment:  ? This is a routine wellness examination for Raahim. ? ?Hearing/Visi

## 2022-01-05 NOTE — Patient Instructions (Signed)
David Schwartz , ?Thank you for taking time to come for your Medicare Wellness Visit. I appreciate your ongoing commitment to your health goals. Please review the following plan we discussed and let me know if I can assist you in the future.  ? ?Screening recommendations/referrals: ?Colonoscopy: Done 10/29/2012 - repeat in 10 years if still recommended for your age ?Recommended yearly ophthalmology/optometry visit for glaucoma screening and checkup ?Recommended yearly dental visit for hygiene and checkup ? ?Vaccinations: ?Influenza vaccine: Done 07/05/2021 - Repeat annually  ?Pneumococcal vaccine: Done 08/10/2011, 02/23/2014 & 04/08/2020   ?Tdap vaccine: Done 03/31/2016 - Repeat in 10 years ?Shingles vaccine: Done  04/13/2020 & 06/09/2020 ?Covid-19: Done  10/18/2019, 11/15/2019, & 10/30/2019 - one other dose - bring card to next visit ? ?Advanced directives: Please bring a copy of your health care power of attorney and living will to the office to be added to your chart at your convenience.  ? ?Conditions/risks identified: Keep up the great work staying active! Aim for 30 minutes of exercise or brisk walking, 6-8 glasses of water, and 5 servings of fruits and vegetables each day.  ? ?Next appointment: Follow up in one year for your annual wellness visit.  ? ?Preventive Care 20 Years and Older, Male ? ?Preventive care refers to lifestyle choices and visits with your health care provider that can promote health and wellness. ?What does preventive care include? ?A yearly physical exam. This is also called an annual well check. ?Dental exams once or twice a year. ?Routine eye exams. Ask your health care provider how often you should have your eyes checked. ?Personal lifestyle choices, including: ?Daily care of your teeth and gums. ?Regular physical activity. ?Eating a healthy diet. ?Avoiding tobacco and drug use. ?Limiting alcohol use. ?Practicing safe sex. ?Taking low doses of aspirin every day. ?Taking vitamin and mineral  supplements as recommended by your health care provider. ?What happens during an annual well check? ?The services and screenings done by your health care provider during your annual well check will depend on your age, overall health, lifestyle risk factors, and family history of disease. ?Counseling  ?Your health care provider may ask you questions about your: ?Alcohol use. ?Tobacco use. ?Drug use. ?Emotional well-being. ?Home and relationship well-being. ?Sexual activity. ?Eating habits. ?History of falls. ?Memory and ability to understand (cognition). ?Work and work Statistician. ?Screening  ?You may have the following tests or measurements: ?Height, weight, and BMI. ?Blood pressure. ?Lipid and cholesterol levels. These may be checked every 5 years, or more frequently if you are over 72 years old. ?Skin check. ?Lung cancer screening. You may have this screening every year starting at age 29 if you have a 30-pack-year history of smoking and currently smoke or have quit within the past 15 years. ?Fecal occult blood test (FOBT) of the stool. You may have this test every year starting at age 21. ?Flexible sigmoidoscopy or colonoscopy. You may have a sigmoidoscopy every 5 years or a colonoscopy every 10 years starting at age 86. ?Prostate cancer screening. Recommendations will vary depending on your family history and other risks. ?Hepatitis C blood test. ?Hepatitis B blood test. ?Sexually transmitted disease (STD) testing. ?Diabetes screening. This is done by checking your blood sugar (glucose) after you have not eaten for a while (fasting). You may have this done every 1-3 years. ?Abdominal aortic aneurysm (AAA) screening. You may need this if you are a current or former smoker. ?Osteoporosis. You may be screened starting at age 36 if you are at  high risk. ?Talk with your health care provider about your test results, treatment options, and if necessary, the need for more tests. ?Vaccines  ?Your health care provider  may recommend certain vaccines, such as: ?Influenza vaccine. This is recommended every year. ?Tetanus, diphtheria, and acellular pertussis (Tdap, Td) vaccine. You may need a Td booster every 10 years. ?Zoster vaccine. You may need this after age 6. ?Pneumococcal 13-valent conjugate (PCV13) vaccine. One dose is recommended after age 14. ?Pneumococcal polysaccharide (PPSV23) vaccine. One dose is recommended after age 71. ?Talk to your health care provider about which screenings and vaccines you need and how often you need them. ?This information is not intended to replace advice given to you by your health care provider. Make sure you discuss any questions you have with your health care provider. ?Document Released: 09/17/2015 Document Revised: 05/10/2016 Document Reviewed: 06/22/2015 ?Elsevier Interactive Patient Education ? 2017 [place]. ? ?Fall Prevention in the Home ?Falls can cause injuries. They can happen to people of all ages. There are many things you can do to make your home safe and to help prevent falls. ?What can I do on the outside of my home? ?Regularly fix the edges of walkways and driveways and fix any cracks. ?Remove anything that might make you trip as you walk through a door, such as a raised step or threshold. ?Trim any bushes or trees on the path to your home. ?Use bright outdoor lighting. ?Clear any walking paths of anything that might make someone trip, such as rocks or tools. ?Regularly check to see if handrails are loose or broken. Make sure that both sides of any steps have handrails. ?Any raised decks and porches should have guardrails on the edges. ?Have any leaves, snow, or ice cleared regularly. ?Use sand or salt on walking paths during winter. ?Clean up any spills in your garage right away. This includes oil or grease spills. ?What can I do in the bathroom? ?Use night lights. ?Install grab bars by the toilet and in the tub and shower. Do not use towel bars as grab bars. ?Use  non-skid mats or decals in the tub or shower. ?If you need to sit down in the shower, use a plastic, non-slip stool. ?Keep the floor dry. Clean up any water that spills on the floor as soon as it happens. ?Remove soap buildup in the tub or shower regularly. ?Attach bath mats securely with double-sided non-slip rug tape. ?Do not have throw rugs and other things on the floor that can make you trip. ?What can I do in the bedroom? ?Use night lights. ?Make sure that you have a light by your bed that is easy to reach. ?Do not use any sheets or blankets that are too big for your bed. They should not hang down onto the floor. ?Have a firm chair that has side arms. You can use this for support while you get dressed. ?Do not have throw rugs and other things on the floor that can make you trip. ?What can I do in the kitchen? ?Clean up any spills right away. ?Avoid walking on wet floors. ?Keep items that you use a lot in easy-to-reach places. ?If you need to reach something above you, use a strong step stool that has a grab bar. ?Keep electrical cords out of the way. ?Do not use floor polish or wax that makes floors slippery. If you must use wax, use non-skid floor wax. ?Do not have throw rugs and other things on the floor that  can make you trip. ?What can I do with my stairs? ?Do not leave any items on the stairs. ?Make sure that there are handrails on both sides of the stairs and use them. Fix handrails that are broken or loose. Make sure that handrails are as long as the stairways. ?Check any carpeting to make sure that it is firmly attached to the stairs. Fix any carpet that is loose or worn. ?Avoid having throw rugs at the top or bottom of the stairs. If you do have throw rugs, attach them to the floor with carpet tape. ?Make sure that you have a light switch at the top of the stairs and the bottom of the stairs. If you do not have them, ask someone to add them for you. ?What else can I do to help prevent falls? ?Wear  shoes that: ?Do not have high heels. ?Have rubber bottoms. ?Are comfortable and fit you well. ?Are closed at the toe. Do not wear sandals. ?If you use a stepladder: ?Make sure that it is fully opened. Do not climb a close

## 2022-01-12 ENCOUNTER — Other Ambulatory Visit: Payer: Self-pay

## 2022-01-12 DIAGNOSIS — R3912 Poor urinary stream: Secondary | ICD-10-CM

## 2022-01-12 MED ORDER — TERAZOSIN HCL 2 MG PO CAPS: 2.0000 mg | ORAL_CAPSULE | Freq: Every day | ORAL | 3 refills | Status: DC

## 2022-01-18 ENCOUNTER — Ambulatory Visit (INDEPENDENT_AMBULATORY_CARE_PROVIDER_SITE_OTHER): Payer: Medicare Other | Admitting: Nurse Practitioner

## 2022-01-18 ENCOUNTER — Encounter: Payer: Self-pay | Admitting: Nurse Practitioner

## 2022-01-18 VITALS — BP 131/88 | HR 74 | Temp 98.6°F | Ht 69.0 in | Wt 175.0 lb

## 2022-01-18 DIAGNOSIS — L0291 Cutaneous abscess, unspecified: Secondary | ICD-10-CM | POA: Diagnosis not present

## 2022-01-18 DIAGNOSIS — L02212 Cutaneous abscess of back [any part, except buttock]: Secondary | ICD-10-CM

## 2022-01-18 NOTE — Patient Instructions (Signed)
Skin Abscess  A skin abscess is an infected area on or under your skin that contains a collection of pus and other material. An abscess may also be called a furuncle, carbuncle, or boil. An abscess can occur in or on almost any part of your body. Some abscesses break open (rupture) on their own. Most continue to get worse unless they are treated. The infection can spread deeper into the body and eventually into your blood, which can make you feel ill. Treatment usually involves draining the abscess. What are the causes? An abscess occurs when germs, like bacteria, pass through your skin and cause an infection. This may be caused by: A scrape or cut on your skin. A puncture wound through your skin, including a needle injection or insect bite. Blocked oil or sweat glands. Blocked and infected hair follicles. A cyst that forms beneath your skin (sebaceous cyst) and becomes infected. What increases the risk? This condition is more likely to develop in people who: Have a weak body defense system (immune system). Have diabetes. Have dry and irritated skin. Get frequent injections or use illegal IV drugs. Have a foreign body in a wound, such as a splinter. Have problems with their lymph system or veins. What are the signs or symptoms? Symptoms of this condition include: A painful, firm bump under the skin. A bump with pus at the top. This may break through the skin and drain. Other symptoms include: Redness surrounding the abscess site. Warmth. Swelling of the lymph nodes (glands) near the abscess. Tenderness. A sore on the skin. How is this diagnosed? This condition may be diagnosed based on: A physical exam. Your medical history. A sample of pus. This may be used to find out what is causing the infection. Blood tests. Imaging tests, such as an ultrasound, CT scan, or MRI. How is this treated? A small abscess that drains on its own may not need treatment. Treatment for larger abscesses  may include: Moist heat or heat pack applied to the area several times a day. A procedure to drain the abscess (incision and drainage). Antibiotic medicines. For a severe abscess, you may first get antibiotics through an IV and then change to antibiotics by mouth. Follow these instructions at home: Medicines  Take over-the-counter and prescription medicines only as told by your health care provider. If you were prescribed an antibiotic medicine, take it as told by your health care provider. Do not stop taking the antibiotic even if you start to feel better. Abscess care  If you have an abscess that has not drained, apply heat to the affected area. Use the heat source that your health care provider recommends, such as a moist heat pack or a heating pad. Place a towel between your skin and the heat source. Leave the heat on for 20-30 minutes. Remove the heat if your skin turns bright red. This is especially important if you are unable to feel pain, heat, or cold. You may have a greater risk of getting burned. Follow instructions from your health care provider about how to take care of your abscess. Make sure you: Cover the abscess with a bandage (dressing). Change your dressing or gauze as told by your health care provider. Wash your hands with soap and water before you change the dressing or gauze. If soap and water are not available, use hand sanitizer. Check your abscess every day for signs of a worsening infection. Check for: More redness, swelling, or pain. More fluid or blood. Warmth.   More pus or a bad smell. General instructions To avoid spreading the infection: Do not share personal care items, towels, or hot tubs with others. Avoid making skin contact with other people. Keep all follow-up visits as told by your health care provider. This is important. Contact a health care provider if you have: More redness, swelling, or pain around your abscess. More fluid or blood coming from  your abscess. Warm skin around your abscess. More pus or a bad smell coming from your abscess. Muscle aches. Chills or a general ill feeling. Get help right away if you: Have severe pain. See red streaks on your skin spreading away from the abscess. See redness that spreads quickly. Have a fever or chills. Summary A skin abscess is an infected area on or under your skin that contains a collection of pus and other material. A small abscess that drains on its own may not need treatment. Treatment for larger abscesses may include having a procedure to drain the abscess and taking an antibiotic. This information is not intended to replace advice given to you by your health care provider. Make sure you discuss any questions you have with your health care provider. Document Revised: 05/30/2021 Document Reviewed: 05/30/2021 Elsevier Patient Education  2023 [employer]vier Inc.  

## 2022-01-18 NOTE — Progress Notes (Addendum)
Acute Office Visit  Subjective:     Patient ID: David Schwartz, male    DOB: Mar 02, 1946, 76 y.o.   MRN: [id number]  Chief Complaint  Patient presents with   skin check    Boil on back    HPI Patient is in today for I & D  Date/Time: 01/19/2022 7:38 PM Performed by: Ivy Lynn, NP Authorized by: Ivy Lynn, NP   Consent:    Consent obtained:  Verbal   Consent given by:  Patient   Risks, benefits, and alternatives were discussed: yes     Risks discussed:  Bleeding, incomplete drainage, pain and infection Pre-procedure details:    Skin preparation:  Chlorhexidine and povidone-iodine Anesthesia:    Anesthesia method:  Local infiltration   Local anesthetic:  Lidocaine 2% WITH epi Procedure type:    Complexity:  Complex Procedure details:    Needle aspiration: no     Incision types:  Single straight   Scalpel blade:  11   Drainage:  Purulent and serosanguinous   Drainage amount:  Moderate   Packing materials:  1/4 in iodoform gauze and wick placed Post-procedure details:    Procedure completion:  Tolerated well, no immediate complications   Review of Systems  Constitutional: Negative.   HENT: Negative.    Eyes: Negative.   Respiratory: Negative.    Cardiovascular: Negative.   Gastrointestinal: Negative.   Genitourinary: Negative.   Musculoskeletal: Negative.   Skin: Negative.   All other systems reviewed and are negative.      Objective:    BP 131/88   Pulse 74   Temp 98.6 F (37 C)   Ht '5\' 9"'$  (1.753 m)   Wt 175 lb (79.4 kg)   SpO2 97%   BMI 25.84 kg/m  BP Readings from Last 3 Encounters:  01/23/22 129/75  01/18/22 131/88  12/19/21 115/70   Wt Readings from Last 3 Encounters:  01/23/22 176 lb (79.8 kg)  01/18/22 175 lb (79.4 kg)  01/05/22 176 lb (79.8 kg)      Physical Exam Vitals and nursing note reviewed.  Constitutional:      Appearance: Normal appearance.  HENT:     Head: Normocephalic.     Nose: Nose normal.   Cardiovascular:     Rate and Rhythm: Normal rate and regular rhythm.     Pulses: Normal pulses.     Heart sounds: Normal heart sounds.  Pulmonary:     Effort: Pulmonary effort is normal.     Breath sounds: Normal breath sounds.  Abdominal:     General: Bowel sounds are normal.  Musculoskeletal:       Back:  Skin:    General: Skin is warm.     Findings: Abscess and erythema present. No rash.          Comments: Abscess right upper quadrant back shoulder  Neurological:     General: No focal deficit present.     Mental Status: He is alert and oriented to person, place, and time.    No results found for any visits on 01/18/22.      Assessment & Plan:   I&D completed patient tolerated well.  Doxycycline 100 mg tablet by mouth as needed.  Follow-up in 5 days.  Education provided for signs and symptoms of infection.  Patient verbalized understanding.  Problem List Items Addressed This Visit   None Visit Diagnoses     Back abscess    -  Primary   Relevant Medications  doxycycline (VIBRA-TABS) 100 MG tablet   Other Relevant Orders   I & D (Completed)       Meds ordered this encounter  Medications   doxycycline (VIBRA-TABS) 100 MG tablet    Sig: Take 1 tablet (100 mg total) by mouth 2 (two) times daily.    Dispense:  14 tablet    Refill:  0    Order Specific Question:   Supervising Provider    Answer:   Claretta Fraise [phone]    No follow-ups on file.  Ivy Lynn, NP

## 2022-01-19 ENCOUNTER — Telehealth: Payer: Self-pay | Admitting: Family Medicine

## 2022-01-19 MED ORDER — DOXYCYCLINE HYCLATE 100 MG PO TABS: 100.0000 mg | ORAL_TABLET | Freq: Two times a day (BID) | ORAL | 0 refills | Status: DC

## 2022-01-19 NOTE — Telephone Encounter (Signed)
Please advise on Rx from yesterday or if this needs to wait for tomorrow when provider returns

## 2022-01-19 NOTE — Telephone Encounter (Signed)
  Prescription Request  01/19/2022  Is this a "Controlled Substance" medicine? no  Have you seen your PCP in the last 2 weeks? Yesterday with je  If YES, route message to pool  -  If NO, patient needs to be scheduled for appointment.  What is the name of the medication or equipment? Rx for boil  Have you contacted your pharmacy to request a refill?  yes  Which pharmacy would you like this sent to? [facility]   Patient notified that their request is being sent to the clinical staff for review and that they should receive a response within 2 business days.

## 2022-01-19 NOTE — Telephone Encounter (Signed)
Please contact David Schwartz and ask her what she wants to send in, I cannot see any notes or see any comments about what she wanted to send in after seeing this patient.

## 2022-01-23 ENCOUNTER — Encounter: Payer: Self-pay | Admitting: Nurse Practitioner

## 2022-01-23 ENCOUNTER — Ambulatory Visit (INDEPENDENT_AMBULATORY_CARE_PROVIDER_SITE_OTHER): Payer: Medicare Other | Admitting: Nurse Practitioner

## 2022-01-23 VITALS — BP 129/75 | HR 88 | Temp 97.6°F | Ht 69.0 in | Wt 176.0 lb

## 2022-01-23 DIAGNOSIS — L02212 Cutaneous abscess of back [any part, except buttock]: Secondary | ICD-10-CM

## 2022-01-23 NOTE — Patient Instructions (Signed)
Skin Abscess  A skin abscess is an infected area on or under your skin that contains a collection of pus and other material. An abscess may also be called a furuncle, carbuncle, or boil. An abscess can occur in or on almost any part of your body. Some abscesses break open (rupture) on their own. Most continue to get worse unless they are treated. The infection can spread deeper into the body and eventually into your blood, which can make you feel ill. Treatment usually involves draining the abscess. What are the causes? An abscess occurs when germs, like bacteria, pass through your skin and cause an infection. This may be caused by: A scrape or cut on your skin. A puncture wound through your skin, including a needle injection or insect bite. Blocked oil or sweat glands. Blocked and infected hair follicles. A cyst that forms beneath your skin (sebaceous cyst) and becomes infected. What increases the risk? This condition is more likely to develop in people who: Have a weak body defense system (immune system). Have diabetes. Have dry and irritated skin. Get frequent injections or use illegal IV drugs. Have a foreign body in a wound, such as a splinter. Have problems with their lymph system or veins. What are the signs or symptoms? Symptoms of this condition include: A painful, firm bump under the skin. A bump with pus at the top. This may break through the skin and drain. Other symptoms include: Redness surrounding the abscess site. Warmth. Swelling of the lymph nodes (glands) near the abscess. Tenderness. A sore on the skin. How is this diagnosed? This condition may be diagnosed based on: A physical exam. Your medical history. A sample of pus. This may be used to find out what is causing the infection. Blood tests. Imaging tests, such as an ultrasound, CT scan, or MRI. How is this treated? A small abscess that drains on its own may not need treatment. Treatment for larger abscesses  may include: Moist heat or heat pack applied to the area several times a day. A procedure to drain the abscess (incision and drainage). Antibiotic medicines. For a severe abscess, you may first get antibiotics through an IV and then change to antibiotics by mouth. Follow these instructions at home: Medicines  Take over-the-counter and prescription medicines only as told by your health care provider. If you were prescribed an antibiotic medicine, take it as told by your health care provider. Do not stop taking the antibiotic even if you start to feel better. Abscess care  If you have an abscess that has not drained, apply heat to the affected area. Use the heat source that your health care provider recommends, such as a moist heat pack or a heating pad. Place a towel between your skin and the heat source. Leave the heat on for 20-30 minutes. Remove the heat if your skin turns bright red. This is especially important if you are unable to feel pain, heat, or cold. You may have a greater risk of getting burned. Follow instructions from your health care provider about how to take care of your abscess. Make sure you: Cover the abscess with a bandage (dressing). Change your dressing or gauze as told by your health care provider. Wash your hands with soap and water before you change the dressing or gauze. If soap and water are not available, use hand sanitizer. Check your abscess every day for signs of a worsening infection. Check for: More redness, swelling, or pain. More fluid or blood. Warmth.   More pus or a bad smell. General instructions To avoid spreading the infection: Do not share personal care items, towels, or hot tubs with others. Avoid making skin contact with other people. Keep all follow-up visits as told by your health care provider. This is important. Contact a health care provider if you have: More redness, swelling, or pain around your abscess. More fluid or blood coming from  your abscess. Warm skin around your abscess. More pus or a bad smell coming from your abscess. Muscle aches. Chills or a general ill feeling. Get help right away if you: Have severe pain. See red streaks on your skin spreading away from the abscess. See redness that spreads quickly. Have a fever or chills. Summary A skin abscess is an infected area on or under your skin that contains a collection of pus and other material. A small abscess that drains on its own may not need treatment. Treatment for larger abscesses may include having a procedure to drain the abscess and taking an antibiotic. This information is not intended to replace advice given to you by your health care provider. Make sure you discuss any questions you have with your health care provider. Document Revised: 05/30/2021 Document Reviewed: 05/30/2021 Elsevier Patient Education  2023 [employer]vier Inc.  

## 2022-01-23 NOTE — Progress Notes (Signed)
   Acute Office Visit  Subjective:     Patient ID: David Schwartz, male    DOB: Oct 20, 1945, 76 y.o.   MRN: [id number]  Chief Complaint  Patient presents with   Follow-up    5 day 5/u boil - doing much better     HPI Patient is in today for Abscess: Patient presents for evaluation of a cutaneous abscess. Lesion is located in the right upper back shoulder.. Onset was a few weeks ago. Symptoms have gradually improved. Abscess has associated symptoms of none. 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 Patient does not have diabetes.   Review of Systems  Constitutional: Negative.   HENT: Negative.    Eyes: Negative.   Respiratory: Negative.    Cardiovascular: Negative.   Genitourinary: Negative.   Skin: Negative.   All other systems reviewed and are negative.      Objective:    BP 129/75   Pulse 88   Temp 97.6 F (36.4 C)   Ht '5\' 9"'$  (1.753 m)   Wt 176 lb (79.8 kg)   SpO2 98%   BMI 25.99 kg/m  BP Readings from Last 3 Encounters:  01/23/22 129/75  01/18/22 131/88  12/19/21 115/70    Physical Exam Vitals and nursing note reviewed.  Constitutional:      Appearance: Normal appearance.  HENT:     Head: Normocephalic.     Right Ear: External ear normal.     Left Ear: External ear normal.     Nose: Nose normal.     Mouth/Throat:     Mouth: Mucous membranes are moist.  Eyes:     Conjunctiva/sclera: Conjunctivae normal.  Cardiovascular:     Rate and Rhythm: Normal rate and regular rhythm.     Pulses: Normal pulses.     Heart sounds: Normal heart sounds.  Pulmonary:     Effort: Pulmonary effort is normal.     Breath sounds: Normal breath sounds.  Abdominal:     General: Bowel sounds are normal.  Skin:    General: Skin is warm.     Findings: Abscess present. No rash.  Neurological:     Mental Status: He is alert.    No results found for any visits on 01/23/22.      Assessment & Plan:  Abscess improving, slight erythema, no tenderness, no  fever or worsening signs of patient. Encourage patient to complete antibiotics.  Education provided to monitor s/s of infection.   Follow-up as needed.   Problem List Items Addressed This Visit   None   No orders of the defined types were placed in this encounter.   Return if symptoms worsen or fail to improve.  Ivy Lynn, NP

## 2022-01-27 ENCOUNTER — Other Ambulatory Visit: Payer: Self-pay | Admitting: Cardiology

## 2022-02-14 ENCOUNTER — Other Ambulatory Visit: Payer: Self-pay | Admitting: Cardiology

## 2022-02-14 DIAGNOSIS — I4891 Unspecified atrial fibrillation: Secondary | ICD-10-CM

## 2022-02-14 DIAGNOSIS — R000 Tachycardia, unspecified: Secondary | ICD-10-CM

## 2022-04-10 ENCOUNTER — Encounter: Payer: Self-pay | Admitting: Family Medicine

## 2022-04-10 ENCOUNTER — Ambulatory Visit (INDEPENDENT_AMBULATORY_CARE_PROVIDER_SITE_OTHER): Payer: Medicare Other | Admitting: Family Medicine

## 2022-04-10 VITALS — BP 118/78 | HR 75 | Temp 98.0°F | Ht 69.0 in | Wt 175.0 lb

## 2022-04-10 DIAGNOSIS — I10 Essential (primary) hypertension: Secondary | ICD-10-CM | POA: Diagnosis not present

## 2022-04-10 DIAGNOSIS — E782 Mixed hyperlipidemia: Secondary | ICD-10-CM

## 2022-04-10 DIAGNOSIS — I4819 Other persistent atrial fibrillation: Secondary | ICD-10-CM | POA: Diagnosis not present

## 2022-04-10 LAB — CMP14+EGFR
ALT: 12 IU/L (ref 0–44)
AST: 21 IU/L (ref 0–40)
Albumin/Globulin Ratio: 2 (ref 1.2–2.2)
Albumin: 4.3 g/dL (ref 3.8–4.8)
Alkaline Phosphatase: 52 IU/L (ref 44–121)
BUN/Creatinine Ratio: 21 (ref 10–24)
BUN: 16 mg/dL (ref 8–27)
Bilirubin Total: 0.5 mg/dL (ref 0.0–1.2)
CO2: 25 mmol/L (ref 20–29)
Calcium: 9.5 mg/dL (ref 8.6–10.2)
Chloride: 102 mmol/L (ref 96–106)
Creatinine, Ser: 0.77 mg/dL (ref 0.76–1.27)
Globulin, Total: 2.1 g/dL (ref 1.5–4.5)
Glucose: 100 mg/dL — ABNORMAL HIGH (ref 70–99)
Potassium: 4.8 mmol/L (ref 3.5–5.2)
Sodium: 141 mmol/L (ref 134–144)
Total Protein: 6.4 g/dL (ref 6.0–8.5)
eGFR: 93 mL/min/{1.73_m2} (ref >59)

## 2022-04-10 LAB — CBC WITH DIFFERENTIAL/PLATELET
Basophils Absolute: 0.1 10*3/uL (ref 0.0–0.2)
Basos: 1 %
EOS (ABSOLUTE): 0.6 10*3/uL — ABNORMAL HIGH (ref 0.0–0.4)
Eos: 9 %
Hematocrit: 41.9 % (ref 37.5–51.0)
Hemoglobin: 14.6 g/dL (ref 13.0–17.7)
Immature Grans (Abs): 0 10*3/uL (ref 0.0–0.1)
Immature Granulocytes: 0 %
Lymphocytes Absolute: 1.2 10*3/uL (ref 0.7–3.1)
Lymphs: 19 %
MCH: 31.8 pg (ref 26.6–33.0)
MCHC: 34.8 g/dL (ref 31.5–35.7)
MCV: 91 fL (ref 79–97)
Monocytes Absolute: 0.6 10*3/uL (ref 0.1–0.9)
Monocytes: 10 %
Neutrophils Absolute: 3.9 10*3/uL (ref 1.4–7.0)
Neutrophils: 61 %
Platelets: 216 10*3/uL (ref 150–450)
RBC: 4.59 x10E6/uL (ref 4.14–5.80)
RDW: 12.8 % (ref 11.6–15.4)
WBC: 6.3 10*3/uL (ref 3.4–10.8)

## 2022-04-10 LAB — LIPID PANEL
Chol/HDL Ratio: 3.4 ratio (ref 0.0–5.0)
Cholesterol, Total: 168 mg/dL (ref 100–199)
HDL: 49 mg/dL (ref >39)
LDL Chol Calc (NIH): 93 mg/dL (ref 0–99)
Triglycerides: 149 mg/dL (ref 0–149)
VLDL Cholesterol Cal: 26 mg/dL (ref 5–40)

## 2022-04-10 NOTE — Progress Notes (Signed)
BP 118/78   Pulse 75   Temp 98 F (36.7 C)   Ht '5\' 9"'  (1.753 m)   Wt 175 lb (79.4 kg)   SpO2 98%   BMI 25.84 kg/m    Subjective:   Patient ID: David Schwartz, male    DOB: 1946/02/03, 76 y.o.   MRN: [id number]  HPI: David Schwartz is a 76 y.o. male presenting on 04/10/2022 for Medical Management of Chronic Issues, Hyperlipidemia, and Hypertension   HPI Hypertension Patient is currently on metoprolol and losartan, and their blood pressure today is 118/78. Patient denies any lightheadedness or dizziness. Patient denies headaches, blurred vision, chest pains, shortness of breath, or weakness. Denies any side effects from medication and is content with current medication.   Hyperlipidemia Patient is coming in for recheck of his hyperlipidemia. The patient is currently taking Crestor. They deny any issues with myalgias or history of liver damage from it. They deny any focal numbness or weakness or chest pain.   A-fib Patient coming in for A-fib recheck.  Denies symptoms.  Currently taking Eliquis and metoprolol.  Denies any bleeding or blood in the stool.  He sees Dr. Harl Bowie again next month.  He is definitely irregular today.  He says sometimes he gets winded a little bit easier with the.  He denies any chest pain or palpitation symptoms.  Relevant past medical, surgical, family and social history reviewed and updated as indicated. Interim medical history since our last visit reviewed. Allergies and medications reviewed and updated.  Review of Systems  Constitutional:  Negative for chills and fever.  Eyes:  Negative for visual disturbance.  Respiratory:  Negative for shortness of breath and wheezing.   Cardiovascular:  Negative for chest pain and leg swelling.  Musculoskeletal:  Negative for back pain and gait problem.  Skin:  Negative for rash.  Neurological:  Negative for dizziness, weakness and light-headedness.  All other systems reviewed and are negative.   Per HPI unless  specifically indicated above   Allergies as of 04/10/2022   No Known Allergies      Medication List        Accurate as of April 10, 2022  9:11 AM. If you have any questions, ask your nurse or doctor.          STOP taking these medications    doxycycline 100 MG tablet Commonly known as: VIBRA-TABS Stopped by: Fransisca Kaufmann Zackry Deines, MD       TAKE these medications    alendronate 70 MG tablet Commonly known as: FOSAMAX TAKE 1 TABLET BY MOUTH WEEKLY  WITH 8 OZ OF PLAIN WATER 30  MINUTES BEFORE FIRST FOOD, [place] OR MEDS. STAY UPRIGHT FOR 30  MINS   calcium carbonate 1500 (600 Ca) MG Tabs tablet Commonly known as: OSCAL Take 600 mg of elemental calcium by mouth daily with breakfast.   Eliquis 5 MG Tabs tablet Generic drug: apixaban Take 1 tablet by mouth twice daily   losartan 25 MG tablet Commonly known as: COZAAR Take 1 tablet by mouth once daily   metoprolol succinate 50 MG 24 hr tablet Commonly known as: TOPROL-XL Take 1 tablet by mouth once daily   rosuvastatin 5 MG tablet Commonly known as: CRESTOR Take 1 tablet by mouth daily   tadalafil 20 MG tablet Commonly known as: CIALIS Take 1 tablet (20 mg total) by mouth as needed for erectile dysfunction.   terazosin 2 MG capsule Commonly known as: HYTRIN Take 1 capsule (2 mg total)  by mouth at bedtime.   triamcinolone cream 0.1 % Commonly known as: KENALOG Apply 1 application topically 2 (two) times daily. What changed:  when to take this reasons to take this   Vitamin D3 25 MCG (1000 UT) Caps Take 3,000 Units by mouth daily.         Objective:   BP 118/78   Pulse 75   Temp 98 F (36.7 C)   Ht '5\' 9"'  (1.753 m)   Wt 175 lb (79.4 kg)   SpO2 98%   BMI 25.84 kg/m   Wt Readings from Last 3 Encounters:  04/10/22 175 lb (79.4 kg)  01/23/22 176 lb (79.8 kg)  01/18/22 175 lb (79.4 kg)    Physical Exam Vitals and nursing note reviewed.  Constitutional:      General: He is not in acute  distress.    Appearance: He is well-developed. He is not diaphoretic.  Eyes:     General: No scleral icterus.    Conjunctiva/sclera: Conjunctivae normal.  Neck:     Thyroid: No thyromegaly.  Cardiovascular:     Rate and Rhythm: Normal rate. Rhythm irregular.     Heart sounds: Normal heart sounds. No murmur heard. Pulmonary:     Effort: Pulmonary effort is normal. No respiratory distress.     Breath sounds: Normal breath sounds. No wheezing.  Musculoskeletal:        General: No swelling. Normal range of motion.     Cervical back: Neck supple.  Lymphadenopathy:     Cervical: No cervical adenopathy.  Skin:    General: Skin is warm and dry.     Findings: No rash.  Neurological:     Mental Status: He is alert and oriented to person, place, and time.     Coordination: Coordination normal.  Psychiatric:        Behavior: Behavior normal.       Assessment & Plan:   Problem List Items Addressed This Visit       Cardiovascular and Mediastinum   Hypertension - Primary   Relevant Orders   CBC with Differential/Platelet   CMP14+EGFR   Persistent atrial fibrillation (HCC)   Relevant Orders   CBC with Differential/Platelet   CMP14+EGFR     Other   Hyperlipemia   Relevant Orders   Lipid panel    Continue current medicine, no changes.  We will do blood work today.  Follow-up with Dr. Harl Bowie next month for the A-fib let us know with Dr. Harl Bowie who says. Follow up plan: Return in about 6 months (around 10/11/2022), or if symptoms worsen or fail to improve, for Physical and hypertension and hyperlipidemia and A-fib.  Counseling provided for all of the vaccine components Orders Placed This Encounter  Procedures   CBC with Differential/Platelet   CMP14+EGFR   Lipid panel    Caryl Pina, MD [facility] 04/10/2022, 9:11 AM

## 2022-04-17 ENCOUNTER — Other Ambulatory Visit: Payer: Medicare Other

## 2022-04-17 DIAGNOSIS — C61 Malignant neoplasm of prostate: Secondary | ICD-10-CM

## 2022-04-18 LAB — PSA: Prostate Specific Ag, Serum: <0.1 ng/mL (ref 0.0–4.0)

## 2022-04-26 ENCOUNTER — Ambulatory Visit: Payer: Medicare Other | Admitting: Urology

## 2022-04-26 ENCOUNTER — Encounter: Payer: Self-pay | Admitting: Urology

## 2022-04-26 VITALS — BP 91/57 | HR 76

## 2022-04-26 DIAGNOSIS — C61 Malignant neoplasm of prostate: Secondary | ICD-10-CM | POA: Diagnosis not present

## 2022-04-26 DIAGNOSIS — R3912 Poor urinary stream: Secondary | ICD-10-CM

## 2022-04-26 DIAGNOSIS — N138 Other obstructive and reflux uropathy: Secondary | ICD-10-CM | POA: Diagnosis not present

## 2022-04-26 DIAGNOSIS — N401 Enlarged prostate with lower urinary tract symptoms: Secondary | ICD-10-CM | POA: Diagnosis not present

## 2022-04-26 DIAGNOSIS — N529 Male erectile dysfunction, unspecified: Secondary | ICD-10-CM

## 2022-04-26 NOTE — Patient Instructions (Signed)

## 2022-04-26 NOTE — Progress Notes (Signed)
04/26/2022 11:04 AM   David Schwartz [birth year] [id number]  Referring provider: Dettinger, Fransisca Kaufmann, MD [place],  [place] [id number]  Followup BPh and Prostate cancer   HPI: Mr Kadlec is a 76yo here for followup for Prostate cancer and BPH. PSA undetectable. IPSS 7 QOL 2 on terazosin. Nocturia 1-2x. He is not able to get or maintain an erection. He has an rx for tadalafil which he has not tried recently. He has intermittent mild hot flashes. He has intermittent urgency to have a bowel movement   PMH: Past Medical History:  Diagnosis Date   Arthritis    Atrial fibrillation ([place])    Cancer ([place])    Dysrhythmia    Hyperlipidemia    Hypertension     Surgical History: Past Surgical History:  Procedure Laterality Date   CARDIOVERSION N/A 01/29/2020   Procedure: CARDIOVERSION;  Surgeon: Satira Sark, MD;  Location: [facility];  Service: Cardiovascular;  Laterality: N/A;   GOLD SEED IMPLANT N/A 06/23/2021   Procedure: GOLD SEED IMPLANT;  Surgeon: Cleon Gustin, MD;  Location: [facility];  Service: Urology;  Laterality: N/A;   HERNIA REPAIR     KNEE ARTHROSCOPY     SPACE OAR INSTILLATION N/A 06/23/2021   Procedure: SPACE OAR INSTILLATION;  Surgeon: Cleon Gustin, MD;  Location: [facility];  Service: Urology;  Laterality: N/A;    Home Medications:  Allergies as of 04/26/2022   No Known Allergies      Medication List        Accurate as of April 26, 2022 11:04 AM. If you have any questions, ask your nurse or doctor.          alendronate 70 MG tablet Commonly known as: FOSAMAX TAKE 1 TABLET BY MOUTH WEEKLY  WITH 8 OZ OF PLAIN WATER 30  MINUTES BEFORE FIRST FOOD, DRINK OR MEDS. STAY UPRIGHT FOR 30  MINS   calcium carbonate 1500 (600 Ca) MG Tabs tablet Commonly known as: OSCAL Take 600 mg of elemental calcium by mouth daily with breakfast.   Eliquis 5 MG Tabs tablet Generic drug: apixaban Take 1 tablet by mouth twice daily   losartan 25 MG  tablet Commonly known as: COZAAR Take 1 tablet by mouth once daily   metoprolol succinate 50 MG 24 hr tablet Commonly known as: TOPROL-XL Take 1 tablet by mouth once daily   rosuvastatin 5 MG tablet Commonly known as: CRESTOR Take 1 tablet by mouth daily   tadalafil 20 MG tablet Commonly known as: CIALIS Take 1 tablet (20 mg total) by mouth as needed for erectile dysfunction.   terazosin 2 MG capsule Commonly known as: HYTRIN Take 1 capsule (2 mg total) by mouth at bedtime.   triamcinolone cream 0.1 % Commonly known as: KENALOG Apply 1 application topically 2 (two) times daily. What changed:  when to take this reasons to take this   Vitamin D3 25 MCG (1000 UT) Caps Take 3,000 Units by mouth daily.        Allergies: No Known Allergies  Family History: Family History  Problem Relation Age of Onset   Diabetes Mother    Hypertension Mother    Early death Father    Arthritis Maternal Grandmother    Hypertension Maternal Grandfather    Hypertension Paternal Grandmother    Cancer Paternal Grandfather        lung    Social History:  reports that he has never smoked. He has never used smokeless tobacco.  He reports current alcohol use. He reports that he does not use drugs.  ROS: All other review of systems were reviewed and are negative except what is noted above in HPI  Physical Exam: BP (!) 91/57   Pulse 76   Constitutional:  Alert and oriented, No acute distress. HEENT: [place] AT, moist mucus membranes.  Trachea midline, no masses. Cardiovascular: No clubbing, cyanosis, or edema. Respiratory: Normal respiratory effort, no increased work of breathing. GI: Abdomen is soft, nontender, nondistended, no abdominal masses GU: No CVA tenderness.  Lymph: No cervical or inguinal lymphadenopathy. Skin: No rashes, bruises or suspicious lesions. Neurologic: Grossly intact, no focal deficits, moving all 4 extremities. Psychiatric: Normal mood and affect.  Laboratory  Data: Lab Results  Component Value Date   WBC 6.3 04/10/2022   HGB 14.6 04/10/2022   HCT 41.9 04/10/2022   MCV 91 04/10/2022   PLT 216 04/10/2022    Lab Results  Component Value Date   CREATININE 0.77 04/10/2022    No results found for: "PSA"  No results found for: "TESTOSTERONE"  No results found for: "HGBA1C"  Urinalysis    Component Value Date/Time   APPEARANCEUR Clear 10/14/2021 1147   GLUCOSEU Negative 10/14/2021 1147   BILIRUBINUR Negative 10/14/2021 1147   PROTEINUR Negative 10/14/2021 1147   NITRITE Negative 10/14/2021 1147   LEUKOCYTESUR Negative 10/14/2021 1147    Lab Results  Component Value Date   LABMICR Comment 10/14/2021    Pertinent Imaging:  No results found for this or any previous visit.  No results found for this or any previous visit.  No results found for this or any previous visit.  No results found for this or any previous visit.  No results found for this or any previous visit.  No results found for this or any previous visit.  No results found for this or any previous visit.  No results found for this or any previous visit.   Assessment & Plan:    1. Prostate cancer ([place]) -RTC 6 months with PSA - Urinalysis, Routine w reflex microscopic  2. Weak urinary stream -continue terazosin '2mg'$  daily  3. Benign prostatic hyperplasia with urinary obstruction -Continue hytrin '2mg'$  daily  4. Erectile dysfunction -tadalafil '20mg'$  prn   No follow-ups on file.  Nicolette Bang, MD  [facility] [place]

## 2022-04-27 LAB — URINALYSIS, ROUTINE W REFLEX MICROSCOPIC
Bilirubin, UA: NEGATIVE
Glucose, UA: NEGATIVE
Ketones, UA: NEGATIVE
Leukocytes,UA: NEGATIVE
Nitrite, UA: NEGATIVE
Protein,UA: NEGATIVE
RBC, UA: NEGATIVE
Specific Gravity, UA: <1.005 — ABNORMAL LOW (ref 1.005–1.030)
Urobilinogen, Ur: 0.2 mg/dL (ref 0.2–1.0)
pH, UA: 5 (ref 5.0–7.5)

## 2022-06-02 ENCOUNTER — Ambulatory Visit: Payer: Medicare Other | Attending: Cardiology | Admitting: Cardiology

## 2022-06-02 ENCOUNTER — Encounter: Payer: Self-pay | Admitting: Cardiology

## 2022-06-02 VITALS — BP 110/70 | HR 84 | Ht 69.0 in | Wt 178.6 lb

## 2022-06-02 DIAGNOSIS — I519 Heart disease, unspecified: Secondary | ICD-10-CM | POA: Diagnosis not present

## 2022-06-02 DIAGNOSIS — Z01812 Encounter for preprocedural laboratory examination: Secondary | ICD-10-CM

## 2022-06-02 DIAGNOSIS — I10 Essential (primary) hypertension: Secondary | ICD-10-CM | POA: Diagnosis not present

## 2022-06-02 DIAGNOSIS — I4891 Unspecified atrial fibrillation: Secondary | ICD-10-CM

## 2022-06-02 DIAGNOSIS — E782 Mixed hyperlipidemia: Secondary | ICD-10-CM

## 2022-06-02 NOTE — H&P (View-Only) (Signed)
Clinical Summary David Schwartz is a 76 y.o.male seen today for follow up of the following medical problems.      1. Afib - new diagnosis noted by pcp during 12/25/19 visit - ekg from pcp reviewed, afib rate 108 - CHADS 2vasc score is at least 2 (age, HTN), started on eliquis and toprol '25mg'$     - s/p DCCV 01/29/20 to SR - 11/2021 clinic visit first noted recurrence  - at our 11/2021 visit did not report symptoms, however since then reports ongoing fatigue similar to what he had when first diagnosed with afib - compliant with meds         2. 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 Prostate cancer - followed by urology - considering radiation   4. Hyperlipidemia 04/2021 TC 170 TG 130 HDL 44 LDL 103  10/2021 TC 169 TG 115 HDL 54 LDL 94 04/2022 TC 168 TG 149 HDL 49 LDL 93 - he is on crestor       David Schwartz is at [employer], sophomore. His son lives [place].    Past Medical History:  Diagnosis Date   Arthritis    Atrial fibrillation (HCC)    Cancer (HCC)    Dysrhythmia    Hyperlipidemia    Hypertension      No Known Allergies   Current Outpatient Medications  Medication Sig Dispense Refill   alendronate (FOSAMAX) 70 MG tablet TAKE 1 TABLET BY MOUTH WEEKLY  WITH 8 OZ OF PLAIN WATER 30  MINUTES BEFORE FIRST FOOD, DRINK OR MEDS. STAY UPRIGHT FOR 30  MINS 12 tablet 1   calcium carbonate (OSCAL) 1500 (600 Ca) MG TABS tablet Take 600 mg of elemental calcium by mouth daily with breakfast.     Cholecalciferol (VITAMIN D3) 1000 units CAPS Take 3,000 Units by mouth daily.     ELIQUIS 5 MG TABS tablet Take 1 tablet by mouth twice daily 60 tablet 6   losartan (COZAAR) 25 MG tablet Take 1 tablet by mouth once daily 90 tablet 3   metoprolol succinate (TOPROL-XL) 50 MG 24 hr tablet Take 1 tablet by mouth once  daily 90 tablet 3   rosuvastatin (CRESTOR) 5 MG tablet Take 1 tablet by mouth daily 90 tablet 3   tadalafil (CIALIS) 20 MG tablet Take 1 tablet (20 mg total) by mouth as needed for erectile dysfunction. 20 tablet 5   terazosin (HYTRIN) 2 MG capsule Take 1 capsule (2 mg total) by mouth at bedtime. 90 capsule 3   triamcinolone (KENALOG) 0.1 % Apply 1 application topically 2 (two) times daily. (Patient taking differently: Apply 1 application  topically daily as needed (rash).) 80 g 3   No current facility-administered medications for this visit.     Past Surgical History:  Procedure Laterality Date   CARDIOVERSION N/A 01/29/2020   Procedure: CARDIOVERSION;  Surgeon: Satira Sark, MD;  Location: [facility];  Service: Cardiovascular;  Laterality: N/A;   GOLD SEED IMPLANT N/A 06/23/2021   Procedure: GOLD SEED IMPLANT;  Surgeon: Cleon Gustin, MD;  Location: [facility];  Service: Urology;  Laterality: N/A;   HERNIA REPAIR     KNEE ARTHROSCOPY     SPACE OAR INSTILLATION N/A 06/23/2021   Procedure: SPACE OAR INSTILLATION;  Surgeon: Nicolette Bang  L, MD;  Location: [facility];  Service: Urology;  Laterality: N/A;     No Known Allergies    Family History  Problem Relation Age of Onset   Diabetes Mother    Hypertension Mother    Early death Father    Arthritis Maternal Grandmother    Hypertension Maternal Grandfather    Hypertension Paternal Grandmother    Cancer Paternal Grandfather        lung     Social History David Schwartz reports that he has never smoked. He has never used smokeless tobacco. David Schwartz reports current alcohol use.   Review of Systems CONSTITUTIONAL: No weight loss, fever, chills, weakness or fatigue.  HEENT: Eyes: No visual loss, blurred vision, double vision or yellow sclerae.No hearing loss, sneezing, congestion, runny nose or sore throat.  SKIN: No rash or itching.  CARDIOVASCULAR: per hpi RESPIRATORY: No shortness of breath, cough or sputum.   GASTROINTESTINAL: No anorexia, nausea, vomiting or diarrhea. No abdominal pain or blood.  GENITOURINARY: No burning on urination, no polyuria NEUROLOGICAL: No headache, dizziness, syncope, paralysis, ataxia, numbness or tingling in the extremities. No change in bowel or bladder control.  MUSCULOSKELETAL: No muscle, back pain, joint pain or stiffness.  LYMPHATICS: No enlarged nodes. No history of splenectomy.  PSYCHIATRIC: No history of depression or anxiety.  ENDOCRINOLOGIC: No reports of sweating, cold or heat intolerance. No polyuria or polydipsia.  Marland Kitchen   Physical Examination Today's Vitals   06/02/22 1055  BP: 110/70  Pulse: 84  SpO2: 97%  Weight: 178 lb 9.6 oz (81 kg)  Height: '5\' 9"'$  (1.753 m)   Body mass index is 26.37 kg/m.  Gen: resting comfortably, no acute distress HEENT: no scleral icterus, pupils equal round and reactive, no palptable cervical adenopathy,  CV: irregular, no m/r/ gno jvd Resp: Clear to auscultation bilaterally GI: abdomen is soft, non-tender, non-distended, normal bowel sounds, no hepatosplenomegaly MSK: extremities are warm, no edema.  Skin: warm, no rash Neuro:  no focal deficits Psych: appropriate affect   Diagnostic Studies 01/2020 echo   IMPRESSIONS     1. Left ventricular ejection fraction, by estimation, is 35 to 40%. The  left ventricle has moderately decreased function. The left ventricle  demonstrates global hypokinesis. There is mild left ventricular  hypertrophy of the posterior segment. Left  ventricular diastolic parameters are indeterminate.   2. Right ventricular systolic function is normal. The right ventricular  size is normal. There is normal pulmonary artery systolic pressure.   3. Left atrial size was moderately dilated.   4. Right atrial size was mildly dilated.   5. The mitral valve is degenerative. Mild to moderate mitral valve  regurgitation.   6. Tricuspid valve regurgitation is moderate.   7. The aortic valve is  tricuspid. Aortic valve regurgitation is mild. No  aortic stenosis is present.   8. The inferior vena cava is normal in size with greater than 50%  respiratory variability, suggesting right atrial pressure of 3 mmHg.    04/2020 echo   IMPRESSIONS     1. Left ventricular ejection fraction, by estimation, is 60 to 65%. The  left ventricle has normal function. The left ventricle has no regional  wall motion abnormalities. Left ventricular diastolic parameters are  consistent with Grade I diastolic  dysfunction (impaired relaxation).   2. Right ventricular systolic function is normal. The right ventricular  size is normal. There is normal pulmonary artery systolic pressure.   3. Left atrial size was moderately dilated.  4. The mitral valve is normal in structure. No evidence of mitral valve  regurgitation. No evidence of mitral stenosis.   5. The aortic valve is tricuspid. Aortic valve regurgitation is mild. No  aortic stenosis is present.   6. The inferior vena cava is normal in size with greater than 50%  respiratory variability, suggesting right atrial pressure of 3 mmHg.     Assessment and Plan  1. Afib - new diagnosis by pcp 12/25/19 - did very well after cardioversion 01/2020, 2 years before recurrence at last visit - since last visit recurrence of fatigue he had when previously in afib despite rate control. Appears he just does better in sinus rhythm - will plan for repeat DCCV, has not missed any doses of eliquis within last 3 weeks - EKG shows afib at 100   2. 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 HTN  -he is at goal, continue curren tmeds   4. Hyperlipidemia - has been at goal, continue current meds      Arnoldo Lenis, M.D

## 2022-06-02 NOTE — Progress Notes (Signed)
Clinical Summary David Schwartz is a 76 y.o.male seen today for follow up of the following medical problems.      1. Afib - new diagnosis noted by pcp during 12/25/19 visit - ekg from pcp reviewed, afib rate 108 - CHADS 2vasc score is at least 2 (age, HTN), started on eliquis and toprol '25mg'$     - s/p DCCV 01/29/20 to [place] - 11/2021 clinic visit first noted recurrence  - at our 11/2021 visit did not report symptoms, however since then reports ongoing fatigue similar to what he had when first diagnosed with afib - compliant with meds         2. 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 Prostate cancer - followed by urology - considering radiation   4. Hyperlipidemia 04/2021 TC 170 TG 130 HDL 44 LDL 103  10/2021 TC 169 TG 115 HDL 54 LDL 94 04/2022 TC 168 TG 149 HDL 49 LDL 93 - he is on crestor       David Schwartz is at [employer], sophomore. His son lives [place].    Past Medical History:  Diagnosis Date   Arthritis    Atrial fibrillation (HCC)    Cancer (HCC)    Dysrhythmia    Hyperlipidemia    Hypertension      No Known Allergies   Current Outpatient Medications  Medication Sig Dispense Refill   alendronate (FOSAMAX) 70 MG tablet TAKE 1 TABLET BY MOUTH WEEKLY  WITH 8 OZ OF PLAIN WATER 30  MINUTES BEFORE FIRST FOOD, DRINK OR MEDS. STAY UPRIGHT FOR 30  MINS 12 tablet 1   calcium carbonate (OSCAL) 1500 (600 Ca) MG TABS tablet Take 600 mg of elemental calcium by mouth daily with breakfast.     Cholecalciferol (VITAMIN D3) 1000 units CAPS Take 3,000 Units by mouth daily.     ELIQUIS 5 MG TABS tablet Take 1 tablet by mouth twice daily 60 tablet 6   losartan (COZAAR) 25 MG tablet Take 1 tablet by mouth once daily 90 tablet 3   metoprolol succinate (TOPROL-XL) 50 MG 24 hr tablet Take 1 tablet by mouth once  daily 90 tablet 3   rosuvastatin (CRESTOR) 5 MG tablet Take 1 tablet by mouth daily 90 tablet 3   tadalafil (CIALIS) 20 MG tablet Take 1 tablet (20 mg total) by mouth as needed for erectile dysfunction. 20 tablet 5   terazosin (HYTRIN) 2 MG capsule Take 1 capsule (2 mg total) by mouth at bedtime. 90 capsule 3   triamcinolone (KENALOG) 0.1 % Apply 1 application topically 2 (two) times daily. (Patient taking differently: Apply 1 application  topically daily as needed (rash).) 80 g 3   No current facility-administered medications for this visit.     Past Surgical History:  Procedure Laterality Date   CARDIOVERSION N/A 01/29/2020   Procedure: CARDIOVERSION;  Surgeon: Satira Sark, MD;  Location: [facility];  Service: Cardiovascular;  Laterality: N/A;   GOLD SEED IMPLANT N/A 06/23/2021   Procedure: GOLD SEED IMPLANT;  Surgeon: Cleon Gustin, MD;  Location: [facility];  Service: Urology;  Laterality: N/A;   HERNIA REPAIR     KNEE ARTHROSCOPY     SPACE OAR INSTILLATION N/A 06/23/2021   Procedure: SPACE OAR INSTILLATION;  Surgeon: Nicolette Bang  L, MD;  Location: [facility];  Service: Urology;  Laterality: N/A;     No Known Allergies    Family History  Problem Relation Age of Onset   Diabetes Mother    Hypertension Mother    Early death Father    Arthritis Maternal Grandmother    Hypertension Maternal Grandfather    Hypertension Paternal Grandmother    Cancer Paternal Grandfather        lung     Social History David Schwartz reports that he has never smoked. He has never used smokeless tobacco. David Schwartz reports current alcohol use.   Review of Systems CONSTITUTIONAL: No weight loss, fever, chills, weakness or fatigue.  HEENT: Eyes: No visual loss, blurred vision, double vision or yellow sclerae.No hearing loss, sneezing, congestion, runny nose or sore throat.  SKIN: No rash or itching.  CARDIOVASCULAR: per hpi RESPIRATORY: No shortness of breath, cough or sputum.   GASTROINTESTINAL: No anorexia, nausea, vomiting or diarrhea. No abdominal pain or blood.  GENITOURINARY: No burning on urination, no polyuria NEUROLOGICAL: No headache, dizziness, syncope, paralysis, ataxia, numbness or tingling in the extremities. No change in bowel or bladder control.  MUSCULOSKELETAL: No muscle, back pain, joint pain or stiffness.  LYMPHATICS: No enlarged nodes. No history of splenectomy.  PSYCHIATRIC: No history of depression or anxiety.  ENDOCRINOLOGIC: No reports of sweating, cold or heat intolerance. No polyuria or polydipsia.  Marland Kitchen   Physical Examination Today's Vitals   06/02/22 1055  BP: 110/70  Pulse: 84  SpO2: 97%  Weight: 178 lb 9.6 oz (81 kg)  Height: '5\' 9"'$  (1.753 m)   Body mass index is 26.37 kg/m.  Gen: resting comfortably, no acute distress HEENT: no scleral icterus, pupils equal round and reactive, no palptable cervical adenopathy,  CV: irregular, no m/r/ gno jvd Resp: Clear to auscultation bilaterally GI: abdomen is soft, non-tender, non-distended, normal bowel sounds, no hepatosplenomegaly MSK: extremities are warm, no edema.  Skin: warm, no rash Neuro:  no focal deficits Psych: appropriate affect   Diagnostic Studies 01/2020 echo   IMPRESSIONS     1. Left ventricular ejection fraction, by estimation, is 35 to 40%. The  left ventricle has moderately decreased function. The left ventricle  demonstrates global hypokinesis. There is mild left ventricular  hypertrophy of the posterior segment. Left  ventricular diastolic parameters are indeterminate.   2. Right ventricular systolic function is normal. The right ventricular  size is normal. There is normal pulmonary artery systolic pressure.   3. Left atrial size was moderately dilated.   4. Right atrial size was mildly dilated.   5. The mitral valve is degenerative. Mild to moderate mitral valve  regurgitation.   6. Tricuspid valve regurgitation is moderate.   7. The aortic valve is  tricuspid. Aortic valve regurgitation is mild. No  aortic stenosis is present.   8. The inferior vena cava is normal in size with greater than 50%  respiratory variability, suggesting right atrial pressure of 3 mmHg.    04/2020 echo   IMPRESSIONS     1. Left ventricular ejection fraction, by estimation, is 60 to 65%. The  left ventricle has normal function. The left ventricle has no regional  wall motion abnormalities. Left ventricular diastolic parameters are  consistent with Grade I diastolic  dysfunction (impaired relaxation).   2. Right ventricular systolic function is normal. The right ventricular  size is normal. There is normal pulmonary artery systolic pressure.   3. Left atrial size was moderately dilated.  4. The mitral valve is normal in structure. No evidence of mitral valve  regurgitation. No evidence of mitral stenosis.   5. The aortic valve is tricuspid. Aortic valve regurgitation is mild. No  aortic stenosis is present.   6. The inferior vena cava is normal in size with greater than 50%  respiratory variability, suggesting right atrial pressure of 3 mmHg.     Assessment and Plan  1. Afib - new diagnosis by pcp 12/25/19 - did very well after cardioversion 01/2020, 2 years before recurrence at last visit - since last visit recurrence of fatigue he had when previously in afib despite rate control. Appears he just does better in sinus rhythm - will plan for repeat DCCV, has not missed any doses of eliquis within last 3 weeks - EKG shows afib at 100   2. 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 HTN  -he is at goal, continue curren tmeds   4. Hyperlipidemia - has been at goal, continue current meds      David Schwartz, M.D

## 2022-06-02 NOTE — Patient Instructions (Addendum)
Medication Instructions:  Continue all current medications.  Labwork: BMET, CBC - orders given today  You can do these labs at your pre-op visit at [facility]   Testing/Procedures: Your physician has recommended that you have a Cardioversion (DCCV). Electrical Cardioversion uses a jolt of electricity to your heart either through paddles or wired patches attached to your chest. This is a controlled, usually prescheduled, procedure. Defibrillation is done under light anesthesia in the hospital, and you usually go home the day of the procedure. This is done to get your heart back into a normal rhythm. You are not awake for the procedure. Please see the instruction sheet given to you today.   Follow-Up: 2 months   Any Other Special Instructions Will Be Listed Below (If Applicable).   If you need a refill on your cardiac medications before your next appointment, please call your pharmacy.

## 2022-06-08 ENCOUNTER — Telehealth: Payer: Self-pay | Admitting: Cardiology

## 2022-06-08 ENCOUNTER — Encounter: Payer: Self-pay | Admitting: *Deleted

## 2022-06-08 NOTE — Telephone Encounter (Signed)
Mychart message sent to patient.

## 2022-06-08 NOTE — Telephone Encounter (Signed)
Pt came by office to check to see when his DCCV is going to be scheduled, he hasn't heard anything and wanted to figure out what was going on.   [phone]

## 2022-06-12 ENCOUNTER — Encounter: Payer: Self-pay | Admitting: *Deleted

## 2022-06-12 NOTE — Patient Instructions (Addendum)
David Schwartz  06/12/2022     '@PREFPERIOPPHARMACY'$ @   Your procedure is scheduled on  06/16/2022.   Report to [facility] at  0730  A.M.   Call this number if you have problems the morning of surgery:  [phone]   Remember:  Do not eat or drink after midnight.          DO NOT miss any doses of your eliquis before your procedure.     Take these medicines the morning of surgery with A SIP OF WATER                                                 None.     Do not wear jewelry, make-up or nail polish.  Do not wear lotions, powders, or perfumes, or deodorant.  Do not shave 48 hours prior to surgery.  Men may shave face and neck.  Do not bring valuables to the hospital.  [facility] is not responsible for any belongings or valuables.  Contacts, dentures or bridgework may not be worn into surgery.  Leave your suitcase in the car.  After surgery it may be brought to your room.  For patients admitted to the hospital, discharge time will be determined by your treatment team.  Patients discharged the day of surgery will not be allowed to drive home and must have someone with them for 24 hours.    Special instructions:   DO NOT smoke tobacco or vape for 24 hours before your procedure.  Please read over the following fact sheets that you were given. Anesthesia Post-op Instructions and Care and Recovery After Surgery       Electrical Cardioversion Electrical cardioversion is the delivery of a jolt of electricity to restore a normal rhythm to the heart. A rhythm that is too fast or is not regular keeps the heart from pumping well. In this procedure, sticky patches or metal paddles are placed on the chest to deliver electricity to the heart from a device. This procedure may be done in an emergency if: There is low or no blood pressure as a result of the heart rhythm. Normal rhythm must be restored as fast as possible to protect the brain and heart from further  damage. It may save a life. This may also be a scheduled procedure for irregular or fast heart rhythms that are not immediately life-threatening. Tell a health care provider about: Any allergies you have. All medicines you are taking, including vitamins, herbs, eye drops, creams, and over-the-counter medicines. Any problems you or family members have had with anesthetic medicines. Any blood disorders you have. Any surgeries you have had. Any medical conditions you have. Whether you are pregnant or may be pregnant. What are the risks? Generally, this is a safe procedure. However, problems may occur, including: Allergic reactions to medicines. A blood clot that breaks free and travels to other parts of your body. The possible return of an abnormal heart rhythm within hours or days after the procedure. Your heart stopping (cardiac arrest). This is rare. What happens before the procedure? Medicines Your health care provider may have you start taking: Blood-thinning medicines (anticoagulants) so your blood does not clot as easily. Medicines to help stabilize your heart rate and rhythm. Ask your health care provider about: Changing or stopping your regular  medicines. This is especially important if you are taking diabetes medicines or blood thinners. Taking medicines such as aspirin and ibuprofen. These medicines can thin your blood. Do not take these medicines unless your health care provider tells you to take them. Taking over-the-counter medicines, vitamins, herbs, and supplements. General instructions Follow instructions from your health care provider about eating or drinking restrictions. Plan to have someone take you home from the hospital or clinic. If you will be going home right after the procedure, plan to have someone with you for 24 hours. Ask your health care provider what steps will be taken to help prevent infection. These may include washing your skin with a germ-killing  soap. What happens during the procedure?  An IV will be inserted into one of your veins. Sticky patches (electrodes) or metal paddles may be placed on your chest. You will be given a medicine to help you relax (sedative). An electrical shock will be delivered. The procedure may vary among health care providers and hospitals. What can I expect after the procedure? Your blood pressure, heart rate, breathing rate, and blood oxygen level will be monitored until you leave the hospital or clinic. Your heart rhythm will be watched to make sure it does not change. You may have some redness on the skin where the shocks were given. Follow these instructions at home: Do not drive for 24 hours if you were given a sedative during your procedure. Take over-the-counter and prescription medicines only as told by your health care provider. Ask your health care provider how to check your pulse. Check it often. Rest for 48 hours after the procedure or as told by your health care provider. Avoid or limit your caffeine use as told by your health care provider. Keep all follow-up visits as told by your health care provider. This is important. Contact a health care provider if: You feel like your heart is beating too quickly or your pulse is not regular. You have a serious muscle cramp that does not go away. Get help right away if: You have discomfort in your chest. You are dizzy or you feel faint. You have trouble breathing or you are short of breath. Your speech is slurred. You have trouble moving an arm or leg on one side of your body. Your fingers or toes turn cold or blue. Summary Electrical cardioversion is the delivery of a jolt of electricity to restore a normal rhythm to the heart. This procedure may be done right away in an emergency or may be a scheduled procedure if the condition is not an emergency. Generally, this is a safe procedure. After the procedure, check your pulse often as told by  your health care provider. This information is not intended to replace advice given to you by your health care provider. Make sure you discuss any questions you have with your health care provider. Document Revised: 07/21/2021 Document Reviewed: 03/24/2019 Elsevier Patient Education  [place] After This sheet gives you information about how to care for yourself after your procedure. Your health care provider may also give you more specific instructions. If you have problems or questions, contact your health care provider. What can I expect after the procedure? After the procedure, it is common to have: Tiredness. Forgetfulness about what happened after the procedure. Impaired judgment for important decisions. Nausea or vomiting. Some difficulty with balance. Follow these instructions at home: For the time period you were told by your health care provider:  Rest as needed. Do not participate in activities where you could fall or become injured. Do not drive or use machinery. Do not drink alcohol. Do not take sleeping pills or medicines that cause drowsiness. Do not make important decisions or sign legal documents. Do not take care of children on your own. Eating and drinking Follow the diet that is recommended by your health care provider. Drink enough fluid to keep your urine pale yellow. If you vomit: Drink water, juice, or soup when you can drink without vomiting. Make sure you have little or no nausea before eating solid foods. General instructions Have a responsible adult stay with you for the time you are told. It is important to have someone help care for you until you are awake and alert. Take over-the-counter and prescription medicines only as told by your health care provider. If you have sleep apnea, surgery and certain medicines can increase your risk for breathing problems. Follow instructions from your health care provider  about wearing your sleep device: Anytime you are sleeping, including during daytime naps. While taking prescription pain medicines, sleeping medicines, or medicines that make you drowsy. Avoid smoking. Keep all follow-up visits as told by your health care provider. This is important. Contact a health care provider if: You keep feeling nauseous or you keep vomiting. You feel light-headed. You are still sleepy or having trouble with balance after 24 hours. You develop a rash. You have a fever. You have redness or swelling around the IV site. Get help right away if: You have trouble breathing. You have new-onset confusion at home. Summary For several hours after your procedure, you may feel tired. You may also be forgetful and have poor judgment. Have a responsible adult stay with you for the time you are told. It is important to have someone help care for you until you are awake and alert. Rest as told. Do not drive or operate machinery. Do not drink alcohol or take sleeping pills. Get help right away if you have trouble breathing, or if you suddenly become confused. This information is not intended to replace advice given to you by your health care provider. Make sure you discuss any questions you have with your health care provider. Document Revised: 07/26/2021 Document Reviewed: 07/24/2019 Elsevier Patient Education  [place].

## 2022-06-13 ENCOUNTER — Telehealth: Payer: Self-pay | Admitting: Cardiology

## 2022-06-13 ENCOUNTER — Other Ambulatory Visit: Payer: Self-pay | Admitting: Cardiology

## 2022-06-13 DIAGNOSIS — I4891 Unspecified atrial fibrillation: Secondary | ICD-10-CM

## 2022-06-13 NOTE — Telephone Encounter (Signed)
Checking percert on the following patient for testing scheduled at [facility].    DCCV scheduled for 06/16/22 at 9:30 am with Dr. Gasper Sells.

## 2022-06-14 ENCOUNTER — Encounter (HOSPITAL_COMMUNITY)
Admission: RE | Admit: 2022-06-14 | Discharge: 2022-06-14 | Disposition: A | Discharge status: Home / Self Care | Payer: Medicare Other | Source: Ambulatory Visit | Attending: Internal Medicine | Admitting: Internal Medicine

## 2022-06-14 ENCOUNTER — Encounter (HOSPITAL_COMMUNITY): Payer: Self-pay

## 2022-06-14 HISTORY — DX: Other complications of anesthesia, initial encounter: T88.59XA

## 2022-06-15 ENCOUNTER — Other Ambulatory Visit: Payer: Self-pay | Admitting: Family Medicine

## 2022-06-16 ENCOUNTER — Encounter (HOSPITAL_COMMUNITY)
Admission: RE | Disposition: A | Discharge status: Home / Self Care | Payer: Self-pay | Source: Ambulatory Visit | Attending: Internal Medicine

## 2022-06-16 ENCOUNTER — Ambulatory Visit (HOSPITAL_COMMUNITY): Payer: Medicare Other | Admitting: Anesthesiology

## 2022-06-16 ENCOUNTER — Encounter (HOSPITAL_COMMUNITY): Payer: Self-pay | Admitting: Internal Medicine

## 2022-06-16 ENCOUNTER — Ambulatory Visit (HOSPITAL_BASED_OUTPATIENT_CLINIC_OR_DEPARTMENT_OTHER): Payer: Medicare Other | Admitting: Anesthesiology

## 2022-06-16 ENCOUNTER — Ambulatory Visit (HOSPITAL_COMMUNITY)
Admission: RE | Admit: 2022-06-16 | Discharge: 2022-06-16 | Disposition: A | Discharge status: Home / Self Care | Payer: Medicare Other | Source: Ambulatory Visit | Attending: Internal Medicine | Admitting: Internal Medicine

## 2022-06-16 DIAGNOSIS — E785 Hyperlipidemia, unspecified: Secondary | ICD-10-CM | POA: Diagnosis not present

## 2022-06-16 DIAGNOSIS — M1990 Unspecified osteoarthritis, unspecified site: Secondary | ICD-10-CM

## 2022-06-16 DIAGNOSIS — I10 Essential (primary) hypertension: Secondary | ICD-10-CM

## 2022-06-16 DIAGNOSIS — M353 Polymyalgia rheumatica: Secondary | ICD-10-CM | POA: Insufficient documentation

## 2022-06-16 DIAGNOSIS — I4891 Unspecified atrial fibrillation: Secondary | ICD-10-CM | POA: Diagnosis not present

## 2022-06-16 DIAGNOSIS — I4819 Other persistent atrial fibrillation: Secondary | ICD-10-CM | POA: Diagnosis not present

## 2022-06-16 DIAGNOSIS — C61 Malignant neoplasm of prostate: Secondary | ICD-10-CM | POA: Insufficient documentation

## 2022-06-16 DIAGNOSIS — Z7901 Long term (current) use of anticoagulants: Secondary | ICD-10-CM | POA: Diagnosis not present

## 2022-06-16 DIAGNOSIS — R000 Tachycardia, unspecified: Secondary | ICD-10-CM | POA: Diagnosis not present

## 2022-06-16 DIAGNOSIS — Z79899 Other long term (current) drug therapy: Secondary | ICD-10-CM | POA: Insufficient documentation

## 2022-06-16 HISTORY — PX: CARDIOVERSION: SHX1299

## 2022-06-16 SURGERY — CARDIOVERSION
Anesthesia: General

## 2022-06-16 MED ORDER — ORAL CARE MOUTH RINSE: 15.0000 mL | Freq: Once | OROMUCOSAL | Status: AC

## 2022-06-16 MED ORDER — CHLORHEXIDINE GLUCONATE 0.12 % MT SOLN
15.0000 mL | Freq: Once | OROMUCOSAL | Status: AC
  Administered 2022-06-16: 15 mL via OROMUCOSAL

## 2022-06-16 MED ORDER — LIDOCAINE HCL (CARDIAC) PF 100 MG/5ML IV SOSY
PREFILLED_SYRINGE | INTRAVENOUS | Status: DC | PRN
  Administered 2022-06-16: 100 mg via INTRAVENOUS

## 2022-06-16 MED ORDER — LACTATED RINGERS IV SOLN: INTRAVENOUS | Status: DC

## 2022-06-16 MED ORDER — PROPOFOL 10 MG/ML IV BOLUS
INTRAVENOUS | Status: DC | PRN
  Administered 2022-06-16: 20 mg via INTRAVENOUS
  Administered 2022-06-16: 40 mg via INTRAVENOUS

## 2022-06-16 MED ORDER — SODIUM CHLORIDE 0.9 % IV SOLN: INTRAVENOUS | Status: DC

## 2022-06-16 NOTE — Transfer of Care (Signed)
Immediate Anesthesia Transfer of Care Note  Patient: David Schwartz  Procedure(s) Performed: CARDIOVERSION  Patient Location: PACU  Anesthesia Type:MAC  Level of Consciousness: drowsy and patient cooperative  Airway & Oxygen Therapy: Patient Spontanous Breathing and Patient connected to nasal cannula oxygen  Post-op Assessment: Report given to RN, Post -op Vital signs reviewed and stable and Patient moving all extremities X 4  Post vital signs: Reviewed and stable  Last Vitals:  Vitals Value Taken Time  BP 116/86   Temp    Pulse 77   Resp 12   SpO2 100     Last Pain:  Vitals:   06/16/22 0805  TempSrc: Oral  PainSc: 0-No pain         Complications: No notable events documented.

## 2022-06-16 NOTE — Progress Notes (Signed)
Electrical Cardioversion Procedure Note David Schwartz [id number] [birth year]  Procedure: Electrical Cardioversion Indications:  Atrial Fibrillation  Procedure Details Consent: Risks of procedure as well as the alternatives and risks of each were explained to the (patient/caregiver).  Consent for procedure obtained. Time Out: Verified patient identification, verified procedure, site/side was marked, verified correct patient position, special equipment/implants available, medications/allergies/relevent history reviewed, required imaging and test results available.  Timeout performed @ 3709 Patient placed on cardiac monitor, pulse oximetry, supplemental oxygen as necessary.  Sedation given:  Propofol Pacer pads placed anterior and posterior chest.  Cardioverted 1 time(s).  Cardioverted at [place].  Evaluation Findings: Post procedure EKG shows: NSR Complications: None Patient did tolerate procedure well.   David Schwartz 06/16/2022, 9:44 AM

## 2022-06-16 NOTE — Anesthesia Preprocedure Evaluation (Signed)
Anesthesia Evaluation  Patient identified by MRN, date of birth, ID band Patient awake    Reviewed: Allergy & Precautions, NPO status , Patient's Chart, lab work & pertinent test results, reviewed documented beta blocker date and time   History of Anesthesia Complications (+) PROLONGED EMERGENCE and history of anesthetic complications  Airway Mallampati: II  TM Distance: >3 FB Neck ROM: Full    Dental  (+) Dental Advisory Given, Chipped, Missing,  Multiple chipped teeth:   Pulmonary neg pulmonary ROS,    Pulmonary exam normal breath sounds clear to auscultation       Cardiovascular Exercise Tolerance: Good hypertension, Pt. on medications and Pt. on home beta blockers + dysrhythmias Atrial Fibrillation  Rhythm:Irregular Rate:Tachycardia     Neuro/Psych negative neurological ROS  negative psych ROS   GI/Hepatic negative GI ROS, Neg liver ROS,   Endo/Other  negative endocrine ROS  Renal/GU negative Renal ROS   PROSTATE CANCER    Musculoskeletal  (+) Arthritis , Osteoarthritis,    Abdominal   Peds  Hematology negative hematology ROS (+)   Anesthesia Other Findings Polymyalgia rheumatica   Reproductive/Obstetrics                             Anesthesia Physical  Anesthesia Plan  ASA: 3  Anesthesia Plan: General   Post-op Pain Management: Minimal or no pain anticipated   Induction: Intravenous  PONV Risk Score and Plan: TIVA  Airway Management Planned: Natural Airway, Nasal Cannula and Simple Face Mask  Additional Equipment:   Intra-op Plan:   Post-operative Plan:   Informed Consent: I have reviewed the patients History and Physical, chart, labs and discussed the procedure including the risks, benefits and alternatives for the proposed anesthesia with the patient or authorized representative who has indicated his/her understanding and acceptance.     Dental advisory  given  Plan Discussed with: CRNA and Surgeon  Anesthesia Plan Comments:         Anesthesia Quick Evaluation

## 2022-06-16 NOTE — Anesthesia Postprocedure Evaluation (Signed)
Anesthesia Post Note  Patient: David Schwartz  Procedure(s) Performed: CARDIOVERSION  Patient location during evaluation: Phase II Anesthesia Type: General Level of consciousness: awake and alert and oriented Pain management: pain level controlled Vital Signs Assessment: post-procedure vital signs reviewed and stable Respiratory status: spontaneous breathing, nonlabored ventilation and respiratory function stable Cardiovascular status: blood pressure returned to baseline and stable Postop Assessment: no apparent nausea or vomiting Anesthetic complications: no   No notable events documented.   Last Vitals:  Vitals:   06/16/22 1030 06/16/22 1035  BP: 137/80 137/80  Pulse: 78 76  Resp: (!) 23 (!) 21  Temp: 36.6 C 36.6 C  SpO2: 100% 99%    Last Pain:  Vitals:   06/16/22 1035  TempSrc: Oral  PainSc: 0-No pain                 Jonnie Truxillo C Obbie Lewallen

## 2022-06-16 NOTE — CV Procedure (Signed)
    Electrical Cardioversion Procedure Note David Schwartz [id number] Oct 26, 1945  Procedure: Electrical Cardioversion Indications:  Atrial Fibrillation  Time Out: Verified patient identification, verified procedure,medications/allergies/relevent history reviewed, required imaging and test results available.  Performed  Procedure Details  The patient was NPO after midnight. Anesthesia was administered at the beside  by [facility] and team. Cardioversion was done with synchronized biphasic defibrillation with AP pads with 200 Joules.  The patient converted to normal sinus rhythm. The patient tolerated the procedure well   IMPRESSION:  Successful cardioversion of atrial fibrillation    David Schwartz 06/16/2022, 9:23 AM

## 2022-06-16 NOTE — Interval H&P Note (Signed)
History and Physical Interval Note:  06/16/2022 9:16 AM  David Schwartz  has presented today for surgery, with the diagnosis of a-fib.  The various methods of treatment have been discussed with the patient and family. After consideration of risks, benefits and other options for treatment, the patient has consented to  Procedure(s): CARDIOVERSION (N/A) as a surgical intervention.  The patient's history has been reviewed, patient examined, no change in status, stable for surgery.  I have reviewed the patient's chart and labs.  Questions were answered to the patient's satisfaction.     Tylena Prisk A Kauri Garson

## 2022-06-16 NOTE — Brief Op Note (Signed)
     Electrical Cardioversion Procedure Note David Schwartz [id number] 03-21-1946  Procedure: Electrical Cardioversion Indications:  Atrial Fibrillation  Time Out: Verified patient identification, verified procedure,medications/allergies/relevent history reviewed, required imaging and test results available.  Performed  Procedure Details  The patient was NPO after midnight. Anesthesia was administered at the beside  by [facility] and team. Cardioversion was done with synchronized biphasic defibrillation with AP pads with 200 Joules.  The patient converted to normal sinus rhythm. The patient tolerated the procedure well   IMPRESSION:  Successful cardioversion of atrial fibrillation    David Schwartz 06/16/2022, 9:23 AM

## 2022-06-21 ENCOUNTER — Encounter (HOSPITAL_COMMUNITY): Payer: Self-pay | Admitting: Internal Medicine

## 2022-08-08 ENCOUNTER — Encounter: Payer: Self-pay | Admitting: Nurse Practitioner

## 2022-08-08 ENCOUNTER — Ambulatory Visit: Payer: Medicare Other | Attending: Nurse Practitioner | Admitting: Nurse Practitioner

## 2022-08-08 VITALS — BP 118/64 | HR 92 | Ht 68.0 in | Wt 178.8 lb

## 2022-08-08 DIAGNOSIS — I428 Other cardiomyopathies: Secondary | ICD-10-CM

## 2022-08-08 DIAGNOSIS — R000 Tachycardia, unspecified: Secondary | ICD-10-CM

## 2022-08-08 DIAGNOSIS — I5022 Chronic systolic (congestive) heart failure: Secondary | ICD-10-CM

## 2022-08-08 DIAGNOSIS — I480 Paroxysmal atrial fibrillation: Secondary | ICD-10-CM | POA: Diagnosis not present

## 2022-08-08 DIAGNOSIS — E785 Hyperlipidemia, unspecified: Secondary | ICD-10-CM

## 2022-08-08 DIAGNOSIS — Z7901 Long term (current) use of anticoagulants: Secondary | ICD-10-CM | POA: Diagnosis not present

## 2022-08-08 MED ORDER — METOPROLOL SUCCINATE ER 50 MG PO TB24: 75.0000 mg | ORAL_TABLET | Freq: Every day | ORAL | 6 refills | Status: DC

## 2022-08-08 NOTE — Patient Instructions (Addendum)
Medication Instructions:  Increase Metoprolol to 1 1/2 tabs daily Continue all other medications.     Labwork: Thyroid panel - order given today   Testing/Procedures: Your physician has requested that you have an echocardiogram. Echocardiography is a painless test that uses sound waves to create images of your heart. It provides your doctor with information about the size and shape of your heart and how well your heart's chambers and valves are working. This procedure takes approximately one hour. There are no restrictions for this procedure. Please do NOT wear cologne, perfume, aftershave, or lotions (deodorant is allowed). Please arrive 15 minutes prior to your appointment time.  Follow-Up: Office will contact with results via phone, letter or mychart.    2-3 months  Any Other Special Instructions Will Be Listed Below (If Applicable). You have been referred to AFib clinid   If you need a refill on your cardiac medications before your next appointment, please call your pharmacy.

## 2022-08-08 NOTE — Progress Notes (Signed)
Cardiology Office Note:    Date:  08/08/2022   ID:  David Schwartz, DOB 02/08/1946, MRN [id number]  PCP:  Dettinger, Fransisca Kaufmann, MD   [address] Providers Cardiologist:  Carlyle Dolly, MD     Referring MD: Dettinger, Fransisca Kaufmann, MD   CC: Here for follow-up  History of Present Illness:    David Schwartz is a 76 y.o. male with a hx of the following:  Paroxysmal A-fib HFrEF HLD Hx of prostate cancer    Patient is a 76 year old male with past medical history as mentioned above.  First diagnosed with A-fib by primary care provider in 2021, heart rate was 108 at that time.  Given CHA2DS2-VASc score was 2 at that time was started on Eliquis and metoprolol 25 mg.  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  Updated echocardiogram 3 months later revealed improvement in EF to 60 to 65%.  Noted recurrence of A-fib in March 2023, but did not report symptoms, rate controlled at that time.   Last seen by Dr. Carlyle Dolly on June 02, 2022.  Patient noted ongoing fatigue similar to symptom he experienced when first diagnosed with A-fib.  Was compliant with his Eliquis and was set up for DCCV.  Underwent successful cardioversion of A-fib on June 16, 2022.   Today he presents for follow-up with his wife.  He states he is concerned he might be back in A-fib as he has been experiencing low stamina and fatigue, similar symptoms he had when he went back into A-fib previously.  Denies any chest pain, shortness of breath, palpitations, syncope, presyncope, dizziness, orthopnea, PND, swelling, significant weight changes, acute bleeding, or claudication.  He has been compliant with taking his Eliquis and is tolerating this well.  Stays very active around the house and enjoys working on cars. BP well controlled at home.  Denies any other questions  or concerns today.   Past Medical History:  Diagnosis Date   Arthritis    Atrial fibrillation ([place])    Cancer ([place])    Complication of anesthesia    difficult waking up   Dysrhythmia    Hyperlipidemia    Hypertension     Past Surgical History:  Procedure Laterality Date   CARDIOVERSION N/A 01/29/2020   Procedure: CARDIOVERSION;  Surgeon: Satira Sark, MD;  Location: [facility];  Service: Cardiovascular;  Laterality: N/A;   CARDIOVERSION N/A 06/16/2022   Procedure: CARDIOVERSION;  Surgeon: Werner Lean, MD;  Location: [facility];  Service: Cardiovascular;  Laterality: N/A;   GOLD SEED IMPLANT N/A 06/23/2021   Procedure: GOLD SEED IMPLANT;  Surgeon: Cleon Gustin, MD;  Location: [facility];  Service: Urology;  Laterality: N/A;   HERNIA REPAIR     KNEE ARTHROSCOPY     SPACE OAR INSTILLATION N/A 06/23/2021   Procedure: SPACE OAR INSTILLATION;  Surgeon: Cleon Gustin, MD;  Location: [facility];  Service: Urology;  Laterality: N/A;    Current Medications: Current Meds  Medication Sig   alendronate (FOSAMAX) 70 MG tablet TAKE 1 TABLET BY MOUTH WEEKLY  WITH 8 OZ OF PLAIN WATER 30  MINUTES BEFORE FIRST FOOD, DRINK OR MEDS. STAY UPRIGHT FOR 30  MINS   calcium carbonate (OSCAL) 1500 (600 Ca) MG TABS tablet Take 600 mg of elemental calcium by mouth daily with breakfast.   Cholecalciferol (VITAMIN D3) 50 MCG (2000  UT) TABS Take 6,000 Units by mouth daily.   ELIQUIS 5 MG TABS tablet Take 1 tablet by mouth twice daily   losartan (COZAAR) 25 MG tablet Take 1 tablet by mouth once daily   rosuvastatin (CRESTOR) 5 MG tablet Take 1 tablet by mouth daily   tadalafil (CIALIS) 20 MG tablet Take 1 tablet (20 mg total) by mouth as needed for erectile dysfunction.   terazosin (HYTRIN) 2 MG capsule Take 1 capsule (2 mg total) by mouth at bedtime.   triamcinolone (KENALOG) 0.1 % Apply 1 application topically 2 (two) times daily. (Patient taking differently: Apply 1 application  topically  daily as needed (rash).)   metoprolol succinate (TOPROL-XL) 50 MG 24 hr tablet Take 1 tablet by mouth once daily     Allergies:   Patient has no known allergies.   Social History   Socioeconomic History   Marital status: Married    Spouse name: David Schwartz   Number of children: Not on file   Years of education: 12   Highest education level: 12th grade  Occupational History   Occupation: Retired  Tobacco Use   Smoking status: Never   Smokeless tobacco: Never  Vaping Use   Vaping Use: Never used  Substance and Sexual Activity   Alcohol use: Yes    Comment: occ   Drug use: Never   Sexual activity: Not on file  Other Topics Concern   Not on file  Social History Narrative   Living with wife - son lives nearby   Social Determinants of Health   Financial Resource Strain: [place]  (01/05/2022)   Overall Financial Resource Strain (CARDIA)    Difficulty of Paying Living Expenses: Not hard at all  Food Insecurity: No [place] (01/05/2022)   Hunger Vital Sign    Worried About Running Out of Food in the Last Year: Never true    [place] in the Last Year: Never true  Transportation Needs: No Transportation Needs (01/05/2022)   PRAPARE - Hydrologist (Medical): No    Lack of Transportation (Non-Medical): No  Physical Activity: Sufficiently Active (01/05/2022)   Exercise Vital Sign    Days of Exercise per Week: 7 days    Minutes of Exercise per Session: 30 min  Stress: No Stress Concern Present (01/05/2022)   [place]    Feeling of Stress : Not at all  Social Connections: Socially Integrated (01/05/2022)   Social Connection and Isolation Panel [NHANES]    Frequency of Communication with Friends and Family: More than three times a week    Frequency of Social Gatherings with Friends and Family: More than three times a week    Attends Religious Services: More than 4 times per year     Active Member of [employer] or Organizations: Yes    Attends Music therapist: More than 4 times per year    Marital Status: Married     Family History: The patient's family history includes Arthritis in his maternal grandmother; Cancer in his paternal grandfather; Diabetes in his mother; Early death in his father; Hypertension in his maternal grandfather, mother, and paternal grandmother.  ROS:   Review of Systems  Constitutional:  Positive for malaise/fatigue. Negative for chills, diaphoresis, fever and weight loss.  HENT: Negative.    Eyes: Negative.   Respiratory: Negative.    Cardiovascular: Negative.   Gastrointestinal: Negative.   Genitourinary: Negative.   Musculoskeletal: Negative.  Skin: Negative.   Neurological: Negative.   Endo/Heme/Allergies: Negative.   Psychiatric/Behavioral: Negative.      Please see the history of present illness.    All other systems reviewed and are negative.  EKGs/Labs/Other Studies Reviewed:    The following studies were reviewed today:   EKG:  EKG is ordered today.  The ekg ordered today demonstrates A-fib with RVR, 101 bpm, otherwise nothing acute.   2D echocardiogram on April 22, 2020:  1. Left ventricular ejection fraction, by estimation, is 60 to 65%. The  left ventricle has normal function. The left ventricle has no regional  wall motion abnormalities. Left ventricular diastolic parameters are  consistent with Grade I diastolic  dysfunction (impaired relaxation).   2. Right ventricular systolic function is normal. The right ventricular  size is normal. There is normal pulmonary artery systolic pressure.   3. Left atrial size was moderately dilated.   4. The mitral valve is normal in structure. No evidence of mitral valve  regurgitation. No evidence of mitral stenosis.   5. The aortic valve is tricuspid. Aortic valve regurgitation is mild. No  aortic stenosis is present.   6. The inferior vena cava is normal in size  with greater than 50%  respiratory variability, suggesting right atrial pressure of 3 mmHg.   Comparison(s): Echocardiogram done 01/28/20 showed an EF of 35-40%.  Recent Labs: 04/10/2022: ALT 12; BUN 16; Creatinine, Ser 0.77; Hemoglobin 14.6; Platelets 216; Potassium 4.8; Sodium 141  Recent Lipid Panel    Component Value Date/Time   CHOL 168 04/10/2022 0926   TRIG 149 04/10/2022 0926   HDL 49 04/10/2022 0926   CHOLHDL 3.4 04/10/2022 0926   LDLCALC 93 04/10/2022 0926     Risk Assessment/Calculations:    CHA2DS2-VASc Score = 3  This indicates a 3.2% annual risk of stroke. The patient's score is based upon: CHF History: 1 HTN History: 0 Diabetes History: 0 Stroke History: 0 Vascular Disease History: 0 Age Score: 2 Gender Score: 0   Physical Exam:    VS:  BP 118/64   Pulse 92   Ht '5\' 8"'$  (1.727 m)   Wt 178 lb 12.8 oz (81.1 kg)   SpO2 97%   BMI 27.19 kg/m     Wt Readings from Last 3 Encounters:  08/08/22 178 lb 12.8 oz (81.1 kg)  06/16/22 178 lb 9.2 oz (81 kg)  06/14/22 (P) 178 lb 9.6 oz (81 kg)     GEN: Well nourished, well developed in no acute distress HEENT: Normal NECK: No JVD; No carotid bruits CARDIAC: S1/S2, , irregular rhythm and fast rate, no murmurs, rubs, gallops; 2+ peripheral pulses throughout, strong and equal bilaterally RESPIRATORY:  Clear and diminished to auscultation without rales, wheezing or rhonchi  MUSCULOSKELETAL:  No edema; No deformity  SKIN: Thin skin, warm and dry NEUROLOGIC:  Alert and oriented x 3 PSYCHIATRIC:  Flat affect  ASSESSMENT:    1. Paroxysmal A-fib ([place])   2. Tachycardia   3. Current use of long term anticoagulation   4. Heart failure with improved ejection fraction (HFimpEF) ([place])   5. NICM (nonischemic cardiomyopathy) ([place])   6. Hyperlipidemia, unspecified hyperlipidemia type    PLAN:    In order of problems listed above:  Paroxysmal A-fib, tachycardia, long term anticoagulation EKG today reveals A-fib, with  heart rate ranging from upper 90s to low 100s.  Denies any palpitations or feeling like his heart is racing.  Hx of two previous DCCV. Will increase metoprolol succinate to 75  mg daily.  Discussed repeating cardioversion and other options for A-fib management.  Also discussed A-fib clinic referral, and he and his wife are agreeable to this referral. Will refer to A-fib clinic. Will check thyroid panel.  CHA2DS2-VASc score 3.  STOP-Bang score 3. Continue Eliquis 5 mg twice daily, he is on appropriate dosage for this. Heart healthy diet and regular cardiovascular as tolerated exercise encouraged.   HFimpEF, NICM Echocardiogram in May 2021 revealed EF 35 to 40% with improvement in EF that returned to normal at 60 to 65% in 04/2020.  Etiology was most likely tachycardia mediated related to his A-fib.  Will update TTE at this time to evaluate EF.  Low sodium diet, fluid restriction <2L, and daily weights encouraged. Educated to contact our office for weight gain of 2 lbs overnight or 5 lbs in one week.  Continue current medication regimen. Heart healthy diet and regular cardiovascular exercise as tolerated encouraged.   HLD Labs from August 2023 revealed total cholesterol 168, HDL 49, LDL 93, and triglycerides 149. Continue rosuvastatin. Heart healthy diet and regular cardiovascular exercise as tolerated encouraged.   Disposition: Follow-up with A-fib clinic within the next month.  Follow-up with me or Dr. Carlyle Dolly in the next 2-3 months or sooner if anything changes.   Medication Adjustments/Labs and Tests Ordered: Current medicines are reviewed at length with the patient today.  Concerns regarding medicines are outlined above.  Orders Placed This Encounter  Procedures   Thyroid Panel With TSH   Amb Referral to AFIB Clinic   ECHOCARDIOGRAM COMPLETE   Meds ordered this encounter  Medications   metoprolol succinate (TOPROL-XL) 50 MG 24 hr tablet    Sig: Take 1.5 tablets (75 mg total) by mouth  daily.    Dispense:  45 tablet    Refill:  6    Dose increased 08/08/2022    Patient Instructions  Medication Instructions:  Increase Metoprolol to 1 1/2 tabs daily Continue all other medications.     Labwork: Thyroid panel - order given today   Testing/Procedures: Your physician has requested that you have an echocardiogram. Echocardiography is a painless test that uses sound waves to create images of your heart. It provides your doctor with information about the size and shape of your heart and how well your heart's chambers and valves are working. This procedure takes approximately one hour. There are no restrictions for this procedure. Please do NOT wear cologne, perfume, aftershave, or lotions (deodorant is allowed). Please arrive 15 minutes prior to your appointment time.  Follow-Up: Office will contact with results via phone, letter or mychart.    2-3 months  Any Other Special Instructions Will Be Listed Below (If Applicable). You have been referred to AFib clinid   If you need a refill on your cardiac medications before your next appointment, please call your pharmacy.    Signed, Finis Bud, NP  08/08/2022 1:35 PM    [place]

## 2022-08-08 NOTE — Addendum Note (Signed)
Addended by: Laurine Blazer on: 08/08/2022 02:01 PM   Modules accepted: Orders

## 2022-08-10 ENCOUNTER — Other Ambulatory Visit (HOSPITAL_COMMUNITY)
Admission: RE | Admit: 2022-08-10 | Discharge: 2022-08-10 | Disposition: A | Discharge status: Home / Self Care | Payer: Medicare Other | Source: Ambulatory Visit | Attending: Cardiology | Admitting: Cardiology

## 2022-08-10 DIAGNOSIS — I480 Paroxysmal atrial fibrillation: Secondary | ICD-10-CM | POA: Insufficient documentation

## 2022-08-12 LAB — THYROID PANEL WITH TSH
Free Thyroxine Index: 2.4 (ref 1.2–4.9)
T3 Uptake Ratio: 33 % (ref 24–39)
T4, Total: 7.4 ug/dL (ref 4.5–12.0)
TSH: 1.36 u[IU]/mL (ref 0.450–4.500)

## 2022-08-21 ENCOUNTER — Ambulatory Visit: Payer: Medicare Other | Attending: Nurse Practitioner

## 2022-08-21 DIAGNOSIS — I480 Paroxysmal atrial fibrillation: Secondary | ICD-10-CM

## 2022-08-21 LAB — ECHOCARDIOGRAM COMPLETE
AR max vel: 2.32 cm2
AV Peak grad: 3.7 mmHg
AV Vena cont: 0.22 cm
Ao pk vel: 0.96 m/s
Calc EF: 56.4 %
MV M vel: 4.86 m/s
MV Peak grad: 94.3 mmHg
S' Lateral: 3.2 cm
Single Plane A2C EF: 55.3 %
Single Plane A4C EF: 55.9 %

## 2022-08-23 ENCOUNTER — Ambulatory Visit (HOSPITAL_COMMUNITY)
Admission: RE | Admit: 2022-08-23 | Discharge: 2022-08-23 | Disposition: A | Discharge status: Home / Self Care | Payer: Medicare Other | Source: Ambulatory Visit | Attending: Physician Assistant | Admitting: Physician Assistant

## 2022-08-23 VITALS — BP 118/80 | HR 94 | Ht 68.0 in | Wt 176.4 lb

## 2022-08-23 DIAGNOSIS — D6869 Other thrombophilia: Secondary | ICD-10-CM | POA: Diagnosis not present

## 2022-08-23 DIAGNOSIS — I428 Other cardiomyopathies: Secondary | ICD-10-CM | POA: Insufficient documentation

## 2022-08-23 DIAGNOSIS — I5020 Unspecified systolic (congestive) heart failure: Secondary | ICD-10-CM | POA: Diagnosis not present

## 2022-08-23 DIAGNOSIS — I110 Hypertensive heart disease with heart failure: Secondary | ICD-10-CM | POA: Diagnosis not present

## 2022-08-23 DIAGNOSIS — E785 Hyperlipidemia, unspecified: Secondary | ICD-10-CM | POA: Diagnosis not present

## 2022-08-23 DIAGNOSIS — I340 Nonrheumatic mitral (valve) insufficiency: Secondary | ICD-10-CM | POA: Insufficient documentation

## 2022-08-23 DIAGNOSIS — Z8546 Personal history of malignant neoplasm of prostate: Secondary | ICD-10-CM | POA: Insufficient documentation

## 2022-08-23 DIAGNOSIS — I4819 Other persistent atrial fibrillation: Secondary | ICD-10-CM | POA: Diagnosis not present

## 2022-08-23 NOTE — Progress Notes (Signed)
Primary Care Physician: Dettinger, Fransisca Kaufmann, MD Primary Cardiologist: Dr Carlyle Dolly Primary Electrophysiologist: none Referring Physician: Finis Bud NP   David Schwartz is a 76 y.o. male with a history of HFrEF, HLD, prostate cancer, atrial fibrillation who presents for consultation in the [facility]. First diagnosed with A-fib by primary care provider in 2021, heart rate was 108 at that time.  Given CHA2DS2-VASc score was 2 at that time was started on Eliquis and metoprolol 25 mg.  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  Updated echocardiogram 3 months later revealed improvement in EF to 60 to 65%.  Noted recurrence of A-fib in March 2023, but did not report symptoms, rate controlled at that time. He began to feel more fatigued and was set up for repeat DCCV on 06/16/22. He was back in afib on follow up 08/08/22 and his BB was increased.   Today, patient remains in afib. However, he does note that his fatigue has improved with increased rate control. No bleeding issues on anticoagulation.   Today, he denies symptoms of palpitations, chest pain, shortness of breath, orthopnea, PND, lower extremity edema, dizziness, presyncope, syncope, snoring, daytime somnolence, bleeding, or neurologic sequela. The patient is tolerating medications without difficulties and is otherwise without complaint today.    Atrial Fibrillation Risk Factors:  he does not have symptoms or diagnosis of sleep apnea. he does not have a history of rheumatic fever.   he has a BMI of Body mass index is 26.82 kg/m.Marland Kitchen Filed Weights   08/23/22 1139  Weight: 80 kg    Family History  Problem Relation Age of Onset   Diabetes Mother    Hypertension Mother    Early death Father    Arthritis Maternal Grandmother    Hypertension  Maternal Grandfather    Hypertension Paternal Grandmother    Cancer Paternal Grandfather        lung     Atrial Fibrillation Management history:  Previous antiarrhythmic drugs: none Previous cardioversions: 2021, 08/08/22 Previous ablations: none CHADS2VASC score: 3 Anticoagulation history: Eliquis   Past Medical History:  Diagnosis Date   Arthritis    Atrial fibrillation ([place])    Cancer ([place])    Complication of anesthesia    difficult waking up   Dysrhythmia    Hyperlipidemia    Hypertension    Past Surgical History:  Procedure Laterality Date   CARDIOVERSION N/A 01/29/2020   Procedure: CARDIOVERSION;  Surgeon: Satira Sark, MD;  Location: [facility];  Service: Cardiovascular;  Laterality: N/A;   CARDIOVERSION N/A 06/16/2022   Procedure: CARDIOVERSION;  Surgeon: Werner Lean, MD;  Location: [facility];  Service: Cardiovascular;  Laterality: N/A;   GOLD SEED IMPLANT N/A 06/23/2021   Procedure: GOLD SEED IMPLANT;  Surgeon: Cleon Gustin, MD;  Location: [facility];  Service: Urology;  Laterality: N/A;   HERNIA REPAIR     KNEE ARTHROSCOPY     SPACE OAR INSTILLATION N/A 06/23/2021   Procedure: SPACE OAR INSTILLATION;  Surgeon: Cleon Gustin, MD;  Location: [facility];  Service: Urology;  Laterality: N/A;    Current Outpatient Medications  Medication Sig Dispense Refill   alendronate (FOSAMAX) 70 MG tablet TAKE 1 TABLET BY MOUTH WEEKLY  WITH 8 OZ OF PLAIN WATER 30  MINUTES BEFORE FIRST FOOD, DRINK OR MEDS. STAY UPRIGHT FOR 30  MINS 12  tablet 1   calcium carbonate (OSCAL) 1500 (600 Ca) MG TABS tablet Take 600 mg of elemental calcium by mouth daily with breakfast.     Cholecalciferol (VITAMIN D3) 50 MCG (2000 UT) TABS Take 6,000 Units by mouth daily.     ELIQUIS 5 MG TABS tablet Take 1 tablet by mouth twice daily 60 tablet 6   losartan (COZAAR) 25 MG tablet Take 1 tablet by mouth once daily 90 tablet 3   metoprolol succinate (TOPROL-XL) 50 MG 24 hr tablet Take 1.5  tablets (75 mg total) by mouth daily. 45 tablet 6   rosuvastatin (CRESTOR) 5 MG tablet Take 1 tablet by mouth daily 90 tablet 3   tadalafil (CIALIS) 20 MG tablet Take 1 tablet (20 mg total) by mouth as needed for erectile dysfunction. 20 tablet 5   terazosin (HYTRIN) 2 MG capsule Take 1 capsule (2 mg total) by mouth at bedtime. 90 capsule 3   triamcinolone (KENALOG) 0.1 % Apply 1 application topically 2 (two) times daily. (Patient taking differently: Apply 1 application  topically daily as needed (rash).) 80 g 3   No current facility-administered medications for this encounter.    No Known Allergies  Social History   Socioeconomic History   Marital status: Married    Spouse name: Katharine Look   Number of children: Not on file   Years of education: 12   Highest education level: 12th grade  Occupational History   Occupation: Retired  Tobacco Use   Smoking status: Never   Smokeless tobacco: Never  Vaping Use   Vaping Use: Never used  Substance and Sexual Activity   Alcohol use: Yes    Comment: occ   Drug use: Never   Sexual activity: Not on file  Other Topics Concern   Not on file  Social History Narrative   Living with wife - son lives nearby   Social Determinants of Health   Financial Resource Strain: [place]  (01/05/2022)   Overall Financial Resource Strain (CARDIA)    Difficulty of Paying Living Expenses: Not hard at all  Food Insecurity: No [place] (01/05/2022)   Hunger Vital Sign    Worried About Running Out of Food in the Last Year: Never true    [place] in the Last Year: Never true  Transportation Needs: No Transportation Needs (01/05/2022)   PRAPARE - Hydrologist (Medical): No    Lack of Transportation (Non-Medical): No  Physical Activity: Sufficiently Active (01/05/2022)   Exercise Vital Sign    Days of Exercise per Week: 7 days    Minutes of Exercise per Session: 30 min  Stress: No Stress Concern Present (01/05/2022)    [place]    Feeling of Stress : Not at all  Social Connections: Socially Integrated (01/05/2022)   Social Connection and Isolation Panel [NHANES]    Frequency of Communication with Friends and Family: More than three times a week    Frequency of Social Gatherings with Friends and Family: More than three times a week    Attends Religious Services: More than 4 times per year    Active Member of [employer] or Organizations: Yes    Attends Archivist Meetings: More than 4 times per year    Marital Status: Married  Human resources officer Violence: Not At Risk (01/05/2022)   Humiliation, Afraid, Rape, and Kick questionnaire    Fear of Current or Ex-Partner: No  Emotionally Abused: No    Physically Abused: No    Sexually Abused: No     ROS- All systems are reviewed and negative except as per the HPI above.  Physical Exam: Vitals:   08/23/22 1139  Weight: 80 kg  Height: '5\' 8"'$  (1.727 m)    GEN- The patient is a well appearing elderly male, alert and oriented x 3 today.   Head- normocephalic, atraumatic Eyes-  Sclera clear, conjunctiva pink Ears- hearing intact Oropharynx- clear Neck- supple  Lungs- Clear to ausculation bilaterally, normal work of breathing Heart- irregular rate and rhythm, no murmurs, rubs or gallops  GI- soft, NT, ND, + BS Extremities- no clubbing, cyanosis, or edema MS- no significant deformity or atrophy Skin- no rash or lesion Psych- euthymic mood, full affect Neuro- strength and sensation are intact  Wt Readings from Last 3 Encounters:  08/23/22 80 kg  08/08/22 81.1 kg  06/16/22 81 kg    EKG today demonstrates  Afib Vent. rate 94 BPM PR interval * ms QRS duration 82 ms QT/QTcB 376/470 ms  Echo 08/21/22 demonstrated  1. Left ventricular ejection fraction, by estimation, is 50 to 55%. The  left ventricle has low normal function. Left ventricular endocardial  border not optimally  defined to evaluate regional wall motion. There is  mild left ventricular hypertrophy. Left ventricular diastolic parameters are indeterminate.   2. Right ventricular systolic function is normal. The right ventricular  size is mildly enlarged.   3. Left atrial size was moderately dilated.   4. The mitral valve is abnormal. Mild mitral valve regurgitation. No  evidence of mitral stenosis.   5. The tricuspid valve is abnormal.   6. The aortic valve was not well visualized. Aortic valve regurgitation  is not visualized. No aortic stenosis is present.   7. The inferior vena cava is normal in size with greater than 50%  respiratory variability, suggesting right atrial pressure of 3 mmHg.   Comparison(s): Echocardiogram done 04/22/20 showed an EF of 60-65%.   Epic records are reviewed at length today  CHA2DS2-VASc Score = 3  The patient's score is based upon: CHF History: 1 HTN History: 0 Diabetes History: 0 Stroke History: 0 Vascular Disease History: 0 Age Score: 2 Gender Score: 0       ASSESSMENT AND PLAN: 1. Persistent Atrial Fibrillation (ICD10:  I48.19) The patient's CHA2DS2-VASc score is 3, indicating a 3.2% annual risk of stroke.   S/p DCCV 06/16/22 with quick return of afib. We discussed rhythm control options including AAD and ablation. Given his h/o previous reduced EF, he may be a good candidate for front-line ablation. He is agreeable to consultation with EP to discuss.  Continue Eliquis 5 mg BID Continue Toprol 75 mg daily  2. Secondary Hypercoagulable State (ICD10:  D68.69) The patient is at significant risk for stroke/thromboembolism based upon his CHA2DS2-VASc Score of 3.  Continue Apixaban (Eliquis).   3. NICM EF 35-40%, normalized post DCCV. EF on last echo 50-55% in afib.  Fluid status appears stable.     Follow up with Dr Myles Gip to discuss ablation.    [facility] [address] [place], [place]  [address] [phone] 08/23/2022 11:47 AM

## 2022-10-09 ENCOUNTER — Ambulatory Visit: Payer: Medicare Other | Attending: Cardiovascular Disease | Admitting: Cardiovascular Disease

## 2022-10-09 ENCOUNTER — Encounter: Payer: Self-pay | Admitting: Cardiovascular Disease

## 2022-10-09 VITALS — BP 120/86 | HR 106 | Ht 68.0 in | Wt 179.0 lb

## 2022-10-09 DIAGNOSIS — Z01812 Encounter for preprocedural laboratory examination: Secondary | ICD-10-CM

## 2022-10-09 DIAGNOSIS — I4819 Other persistent atrial fibrillation: Secondary | ICD-10-CM | POA: Diagnosis not present

## 2022-10-09 NOTE — Patient Instructions (Addendum)
Medication Instructions:  Your physician recommends that you continue on your current medications as directed. Please refer to the Current Medication list given to you today.   *If you need a refill on your cardiac medications before your next appointment, please call your pharmacy*   Lab Work: On December 20, 2022: CBC, BMET (you do not need to be fasting) You may come to the lab any time between 7:30am and 4:30pm. If you have labs (blood work) drawn today and your tests are completely normal, you will receive your results only by: [place] (if you have MyChart) OR A paper copy in the mail If you have any lab test that is abnormal or we need to change your treatment, we will call you to review the results.   Testing/Procedures: Your physician has requested that you have cardiac CT. Cardiac computed tomography (CT) is a painless test that uses an x-ray machine to take clear, detailed pictures of your heart. For further information please visit [email or web address]. Please follow instruction sheet as given.   Your physician has recommended that you have an ablation. Catheter ablation is a medical procedure used to treat some cardiac arrhythmias (irregular heartbeats). During catheter ablation, a long, thin, flexible tube is put into a blood vessel in your groin (upper thigh), or neck. This tube is called an ablation catheter. It is then guided to your heart through the blood vessel. Radio frequency waves destroy small areas of heart tissue where abnormal heartbeats may cause an arrhythmia to start.    Your ablation procedure is scheduled for Thursday Jan 04, 2023 with Dr.  Kaleen Odea at 10:30am. Dennis Bast will arrive at [facility]. [facility] (main entrance A) at 08:30am. Your instruction letter will be available to view on MyChart under Letters and will be reviewed with you by April G. at your lab appointment on 12/20/2022.   Follow-Up: At [facility], you and your health needs are our  priority.  As part of our continuing mission to provide you with exceptional heart care, we have created designated Provider Care Teams.  These Care Teams include your primary Cardiologist (physician) and Advanced Practice Providers (APPs -  Physician Assistants and Nurse Practitioners) who all work together to provide you with the care you need, when you need it.   Your next appointment:   4 week(s) after your ablation   Provider:   You will follow up in the [facility] located at [facility]. Your provider will be: Roderic Palau, NP or Clint R. Fenton, PA-C

## 2022-10-09 NOTE — Addendum Note (Signed)
Addended by: Molli Barrows on: 10/09/2022 10:41 AM   Modules accepted: Orders

## 2022-10-09 NOTE — Progress Notes (Signed)
Electrophysiology Office Note:    Date:  10/09/2022   ID:  David Schwartz, DOB 08-Sep-1945, MRN [id number]  PCP:  Dettinger, Fransisca Kaufmann, MD   [place] Providers Cardiologist:  Carlyle Dolly, MD     Referring MD: Oliver Barre, PA   History of Present Illness:    David Schwartz is a 77 y.o. male with a hx listed below, significant for atrial fibrillation, HFrEF referred for arrhythmia management.  He was first diagnosed with atrial fibrillation in 2021. HR was elevated on presentation. He was managed with metoprolol and eliquis, and eventually underwent DCCV. EF was 35-40% at the time of cardioversion but normalize with sinus rhythm and normal heart rate.  He had recurrence of AF in 2023 and underwent DCCV again in October after complaining of fatigue. He had recurrence of AF within a few weeks. He reports that he has increased fatigue when in AF and felt better after his cardioversions.  Otherwise, he has no chest pain, syncope, pre-syncope.   Past Medical History:  Diagnosis Date   Arthritis    Atrial fibrillation ([place])    Cancer ([place])    Complication of anesthesia    difficult waking up   Dysrhythmia    Hyperlipidemia    Hypertension     Past Surgical History:  Procedure Laterality Date   CARDIOVERSION N/A 01/29/2020   Procedure: CARDIOVERSION;  Surgeon: Satira Sark, MD;  Location: [facility];  Service: Cardiovascular;  Laterality: N/A;   CARDIOVERSION N/A 06/16/2022   Procedure: CARDIOVERSION;  Surgeon: Werner Lean, MD;  Location: [facility];  Service: Cardiovascular;  Laterality: N/A;   GOLD SEED IMPLANT N/A 06/23/2021   Procedure: GOLD SEED IMPLANT;  Surgeon: Cleon Gustin, MD;  Location: [facility];  Service: Urology;  Laterality: N/A;   HERNIA REPAIR     KNEE ARTHROSCOPY     SPACE OAR INSTILLATION N/A 06/23/2021   Procedure: SPACE OAR INSTILLATION;  Surgeon: Cleon Gustin, MD;  Location: [facility];  Service: Urology;  Laterality: N/A;     Current Medications: Current Meds  Medication Sig   alendronate (FOSAMAX) 70 MG tablet TAKE 1 TABLET BY MOUTH WEEKLY  WITH 8 OZ OF PLAIN WATER 30  MINUTES BEFORE FIRST FOOD, DRINK OR MEDS. STAY UPRIGHT FOR 30  MINS   calcium carbonate (OSCAL) 1500 (600 Ca) MG TABS tablet Take 600 mg of elemental calcium by mouth daily with breakfast.   Cholecalciferol (VITAMIN D3) 50 MCG (2000 UT) TABS Take 6,000 Units by mouth daily.   ELIQUIS 5 MG TABS tablet Take 1 tablet by mouth twice daily   losartan (COZAAR) 25 MG tablet Take 1 tablet by mouth once daily   metoprolol succinate (TOPROL-XL) 50 MG 24 hr tablet Take 1.5 tablets (75 mg total) by mouth daily.   rosuvastatin (CRESTOR) 5 MG tablet Take 1 tablet by mouth daily   tadalafil (CIALIS) 20 MG tablet Take 1 tablet (20 mg total) by mouth as needed for erectile dysfunction.   terazosin (HYTRIN) 2 MG capsule Take 1 capsule (2 mg total) by mouth at bedtime.   triamcinolone (KENALOG) 0.1 % Apply 1 application topically 2 (two) times daily. (Patient taking differently: Apply 1 application  topically daily as needed (rash).)     Allergies:   Patient has no known allergies.   Social History   Socioeconomic History   Marital status: Married    Spouse name: Katharine Look   Number of children: Not on file   Years of  education: 12   Highest education level: 12th grade  Occupational History   Occupation: Retired  Tobacco Use   Smoking status: Never   Smokeless tobacco: Never  Vaping Use   Vaping Use: Never used  Substance and Sexual Activity   Alcohol use: Yes    Comment: occ   Drug use: Never   Sexual activity: Not on file  Other Topics Concern   Not on file  Social History Narrative   Living with wife - son lives nearby   Social Determinants of Health   Financial Resource Strain: [place]  (01/05/2022)   Overall Financial Resource Strain (CARDIA)    Difficulty of Paying Living Expenses: Not hard at all  Food Insecurity: No [place]  (01/05/2022)   Hunger Vital Sign    Worried About Running Out of Food in the Last Year: Never true    [place] in the Last Year: Never true  Transportation Needs: No Transportation Needs (01/05/2022)   PRAPARE - Hydrologist (Medical): No    Lack of Transportation (Non-Medical): No  Physical Activity: Sufficiently Active (01/05/2022)   Exercise Vital Sign    Days of Exercise per Week: 7 days    Minutes of Exercise per Session: 30 min  Stress: No Stress Concern Present (01/05/2022)   [place]    Feeling of Stress : Not at all  Social Connections: Socially Integrated (01/05/2022)   Social Connection and Isolation Panel [NHANES]    Frequency of Communication with Friends and Family: More than three times a week    Frequency of Social Gatherings with Friends and Family: More than three times a week    Attends Religious Services: More than 4 times per year    Active Member of [employer] or Organizations: Yes    Attends Music therapist: More than 4 times per year    Marital Status: Married     Family History: The patient's family history includes Arthritis in his maternal grandmother; Cancer in his paternal grandfather; Diabetes in his mother; Early death in his father; Hypertension in his maternal grandfather, mother, and paternal grandmother.  ROS:   Please see the history of present illness.    All other systems reviewed and are negative.  EKGs/Labs/Other Studies Reviewed Today:     TTE: 08/22/22 EF 50-55%, mildly dilated LA, normal mitral valve  EKG:  Last EKG results: today - AF I have reviewed all ECGs available in [facility]. AF, when present, is coarse, and there appears to be good atrial electrical amplitude.  Recent Labs: 04/10/2022: ALT 12; BUN 16; Creatinine, Ser 0.77; Hemoglobin 14.6; Platelets 216; Potassium 4.8; Sodium 141 08/10/2022: TSH 1.360     Physical Exam:     VS:  BP 120/86   Pulse (!) 106   Ht '5\' 8"'$  (1.727 m)   Wt 179 lb (81.2 kg)   SpO2 99%   BMI 27.22 kg/m     Wt Readings from Last 3 Encounters:  10/09/22 179 lb (81.2 kg)  08/23/22 176 lb 6.4 oz (80 kg)  08/08/22 178 lb 12.8 oz (81.1 kg)     GEN: Well nourished, well developed in no acute distress CARDIAC: Irregular rhythm, no murmurs, rubs, gallops RESPIRATORY:  Normal work of breathing MUSCULOSKELETAL: no edema    ASSESSMENT & PLAN:    Atrial fibrillation: persistent, symptomatic with fatigue.  We discussed management options including rhythm control with either medication or  ablation versus leaving him in atrial fibrillation.  He would prefer rhythm control approach since asymptomatic and does not want to take medications.  We discussed the indication, rationale, logistics, anticipated benefits, and potential risks of the ablation procedure including but not limited to -- bleed at the groin access site, chest pain, damage to nearby organs such as the diaphragm, lungs, or esophagus, need for a drainage tube, or prolonged hospitalization. I explained that the risk for stroke, heart attack, need for open chest surgery, or even death is very low but not zero. he  expressed understanding and wishes to proceed.  CHFrEF: likely due to tachycardia and AF. EF has recovered.        Medication Adjustments/Labs and Tests Ordered: Current medicines are reviewed at length with the patient today.  Concerns regarding medicines are outlined above.  Orders Placed This Encounter  Procedures   EKG 12-Lead   No orders of the defined types were placed in this encounter.    Signed, Melida Quitter, MD  10/09/2022 10:31 AM    [place]

## 2022-10-12 ENCOUNTER — Encounter: Payer: Self-pay | Admitting: Family Medicine

## 2022-10-12 ENCOUNTER — Ambulatory Visit (INDEPENDENT_AMBULATORY_CARE_PROVIDER_SITE_OTHER): Payer: Medicare Other | Admitting: Family Medicine

## 2022-10-12 VITALS — BP 128/89 | HR 85 | Ht 68.0 in | Wt 177.0 lb

## 2022-10-12 DIAGNOSIS — Z0001 Encounter for general adult medical examination with abnormal findings: Secondary | ICD-10-CM | POA: Diagnosis not present

## 2022-10-12 DIAGNOSIS — Z0000 Encounter for general adult medical examination without abnormal findings: Secondary | ICD-10-CM | POA: Diagnosis not present

## 2022-10-12 DIAGNOSIS — I10 Essential (primary) hypertension: Secondary | ICD-10-CM

## 2022-10-12 DIAGNOSIS — E782 Mixed hyperlipidemia: Secondary | ICD-10-CM

## 2022-10-12 DIAGNOSIS — I4819 Other persistent atrial fibrillation: Secondary | ICD-10-CM

## 2022-10-12 DIAGNOSIS — M353 Polymyalgia rheumatica: Secondary | ICD-10-CM | POA: Diagnosis not present

## 2022-10-12 DIAGNOSIS — E785 Hyperlipidemia, unspecified: Secondary | ICD-10-CM | POA: Diagnosis not present

## 2022-10-12 LAB — LIPID PANEL

## 2022-10-12 MED ORDER — TRIAMCINOLONE ACETONIDE 0.1 % EX CREA: 1.0000 | TOPICAL_CREAM | Freq: Two times a day (BID) | CUTANEOUS | 3 refills | Status: DC

## 2022-10-12 MED ORDER — ROSUVASTATIN CALCIUM 5 MG PO TABS: ORAL_TABLET | ORAL | 3 refills | Status: DC

## 2022-10-12 NOTE — Progress Notes (Signed)
BP 128/89   Pulse 85   Ht '5\' 8"'$  (1.727 m)   Wt 177 lb (80.3 kg)   SpO2 97%   BMI 26.91 kg/m    Subjective:   Patient ID: David Schwartz, male    DOB: 1946-08-09, 77 y.o.   MRN: [id number]  HPI: David Schwartz is a 77 y.o. male presenting on 10/12/2022 for Medical Management of Chronic Issues, Hyperlipidemia, and Hypertension   HPI Osteoporosis/osteopenia Fractures or history of fracture: None Medication: Fosamax Duration of treatment: 5 or 6 years, difficult to tell in the computer Last bone density scan: August 2022 Last T score: -1.6  Hypertension and A-fib Patient is currently on Eliquis and losartan and metoprolol, and their blood pressure today is 128/89. Patient denies any lightheadedness or dizziness. Patient denies headaches, blurred vision, chest pains, shortness of breath, or weakness. Denies any side effects from medication and is content with current medication.   Hyperlipidemia Patient is coming in for recheck of his hyperlipidemia. The patient is currently taking Crestor. They deny any issues with myalgias or history of liver damage from it. They deny any focal numbness or weakness or chest pain.   Relevant past medical, surgical, family and social history reviewed and updated as indicated. Interim medical history since our last visit reviewed. Allergies and medications reviewed and updated.  Review of Systems  Constitutional:  Negative for chills and fever.  HENT:  Negative for ear pain and tinnitus.   Eyes:  Negative for pain and visual disturbance.  Respiratory:  Negative for cough, shortness of breath and wheezing.   Cardiovascular:  Negative for chest pain, palpitations and leg swelling.  Gastrointestinal:  Negative for abdominal pain, blood in stool, constipation and diarrhea.  Genitourinary:  Negative for dysuria and hematuria.  Musculoskeletal:  Negative for back pain, gait problem and myalgias.  Skin:  Negative for rash.  Neurological:  Negative for  dizziness, weakness, light-headedness and headaches.  Psychiatric/Behavioral:  Negative for suicidal ideas.   All other systems reviewed and are negative.   Per HPI unless specifically indicated above   Allergies as of 10/12/2022   No Known Allergies      Medication List        Accurate as of October 12, 2022  9:59 AM. If you have any questions, ask your nurse or doctor.          alendronate 70 MG tablet Commonly known as: FOSAMAX TAKE 1 TABLET BY MOUTH WEEKLY  WITH 8 OZ OF PLAIN WATER 30  MINUTES BEFORE FIRST FOOD, DRINK OR MEDS. STAY UPRIGHT FOR 30  MINS   calcium carbonate 1500 (600 Ca) MG Tabs tablet Commonly known as: OSCAL Take 600 mg of elemental calcium by mouth daily with breakfast.   Eliquis 5 MG Tabs tablet Generic drug: apixaban Take 1 tablet by mouth twice daily   losartan 25 MG tablet Commonly known as: COZAAR Take 1 tablet by mouth once daily   metoprolol succinate 50 MG 24 hr tablet Commonly known as: TOPROL-XL Take 1.5 tablets (75 mg total) by mouth daily.   rosuvastatin 5 MG tablet Commonly known as: CRESTOR Take 1 tablet by mouth daily   tadalafil 20 MG tablet Commonly known as: CIALIS Take 1 tablet (20 mg total) by mouth as needed for erectile dysfunction.   terazosin 2 MG capsule Commonly known as: HYTRIN Take 1 capsule (2 mg total) by mouth at bedtime.   triamcinolone cream 0.1 % Commonly known as: KENALOG Apply 1 Application topically  2 (two) times daily. What changed:  when to take this reasons to take this   Vitamin D3 50 MCG (2000 UT) Tabs Take 6,000 Units by mouth daily.         Objective:   BP 128/89   Pulse 85   Ht '5\' 8"'$  (1.727 m)   Wt 177 lb (80.3 kg)   SpO2 97%   BMI 26.91 kg/m   Wt Readings from Last 3 Encounters:  10/12/22 177 lb (80.3 kg)  10/09/22 179 lb (81.2 kg)  08/23/22 176 lb 6.4 oz (80 kg)    Physical Exam Vitals reviewed.  Constitutional:      General: He is not in acute distress.     Appearance: He is well-developed. He is not diaphoretic.  HENT:     Right Ear: External ear normal.     Left Ear: External ear normal.     Nose: Nose normal.     Mouth/Throat:     Pharynx: No oropharyngeal exudate.  Eyes:     General: No scleral icterus.    Conjunctiva/sclera: Conjunctivae normal.  Neck:     Thyroid: No thyromegaly.  Cardiovascular:     Rate and Rhythm: Normal rate and regular rhythm.     Heart sounds: Normal heart sounds. No murmur heard. Pulmonary:     Effort: Pulmonary effort is normal. No respiratory distress.     Breath sounds: Normal breath sounds. No wheezing.  Abdominal:     General: Bowel sounds are normal. There is no distension.     Palpations: Abdomen is soft.     Tenderness: There is no abdominal tenderness. There is no guarding or rebound.  Musculoskeletal:        General: Normal range of motion.     Cervical back: Neck supple.  Lymphadenopathy:     Cervical: No cervical adenopathy.  Skin:    General: Skin is warm and dry.     Findings: No rash.  Neurological:     Mental Status: He is alert and oriented to person, place, and time.     Coordination: Coordination normal.  Psychiatric:        Behavior: Behavior normal.       Assessment & Plan:   Problem List Items Addressed This Visit       Cardiovascular and Mediastinum   Hypertension   Relevant Medications   rosuvastatin (CRESTOR) 5 MG tablet   Persistent atrial fibrillation (HCC)   Relevant Medications   rosuvastatin (CRESTOR) 5 MG tablet     Other   Hyperlipemia   Relevant Medications   rosuvastatin (CRESTOR) 5 MG tablet   PMR (polymyalgia rheumatica) (HCC)   Other Visit Diagnoses     Physical exam    -  Primary   Relevant Medications   rosuvastatin (CRESTOR) 5 MG tablet   triamcinolone cream (KENALOG) 0.1 %   Other Relevant Orders   CBC with Differential/Platelet   CMP14+EGFR   Lipid panel       Continue current medicine, seems to be doing well. Follow up  plan: Return in about 6 months (around 04/12/2023), or if symptoms worsen or fail to improve, for Hypertension and hyperlipidemia recheck.  Counseling provided for all of the vaccine components Orders Placed This Encounter  Procedures   CBC with Differential/Platelet   CMP14+EGFR   Lipid panel    Caryl Pina, MD [facility] 10/12/2022, 9:59 AM

## 2022-10-13 LAB — CBC WITH DIFFERENTIAL/PLATELET
Basophils Absolute: 0.1 10*3/uL (ref 0.0–0.2)
Basos: 1 %
EOS (ABSOLUTE): 0.3 10*3/uL (ref 0.0–0.4)
Eos: 5 %
Hematocrit: 41.3 % (ref 37.5–51.0)
Hemoglobin: 14.5 g/dL (ref 13.0–17.7)
Immature Grans (Abs): 0 10*3/uL (ref 0.0–0.1)
Immature Granulocytes: 0 %
Lymphocytes Absolute: 1.5 10*3/uL (ref 0.7–3.1)
Lymphs: 26 %
MCH: 31.9 pg (ref 26.6–33.0)
MCHC: 35.1 g/dL (ref 31.5–35.7)
MCV: 91 fL (ref 79–97)
Monocytes Absolute: 0.6 10*3/uL (ref 0.1–0.9)
Monocytes: 10 %
Neutrophils Absolute: 3.2 10*3/uL (ref 1.4–7.0)
Neutrophils: 58 %
Platelets: 198 10*3/uL (ref 150–450)
RBC: 4.54 x10E6/uL (ref 4.14–5.80)
RDW: 12.3 % (ref 11.6–15.4)
WBC: 5.6 10*3/uL (ref 3.4–10.8)

## 2022-10-13 LAB — CMP14+EGFR
ALT: 11 IU/L (ref 0–44)
AST: 20 IU/L (ref 0–40)
Albumin/Globulin Ratio: 2.4 — ABNORMAL HIGH (ref 1.2–2.2)
Albumin: 4.6 g/dL (ref 3.8–4.8)
Alkaline Phosphatase: 53 IU/L (ref 44–121)
BUN/Creatinine Ratio: 20 (ref 10–24)
BUN: 17 mg/dL (ref 8–27)
Bilirubin Total: 0.6 mg/dL (ref 0.0–1.2)
CO2: 23 mmol/L (ref 20–29)
Calcium: 9.4 mg/dL (ref 8.6–10.2)
Chloride: 102 mmol/L (ref 96–106)
Creatinine, Ser: 0.85 mg/dL (ref 0.76–1.27)
Globulin, Total: 1.9 g/dL (ref 1.5–4.5)
Glucose: 96 mg/dL (ref 70–99)
Potassium: 5.1 mmol/L (ref 3.5–5.2)
Sodium: 141 mmol/L (ref 134–144)
Total Protein: 6.5 g/dL (ref 6.0–8.5)
eGFR: 90 mL/min/{1.73_m2} (ref >59)

## 2022-10-13 LAB — LIPID PANEL
Chol/HDL Ratio: 3.2 ratio (ref 0.0–5.0)
Cholesterol, Total: 167 mg/dL (ref 100–199)
HDL: 52 mg/dL (ref >39)
LDL Chol Calc (NIH): 95 mg/dL (ref 0–99)
Triglycerides: 114 mg/dL (ref 0–149)
VLDL Cholesterol Cal: 20 mg/dL (ref 5–40)

## 2022-10-18 ENCOUNTER — Other Ambulatory Visit: Payer: Medicare Other

## 2022-10-19 LAB — PSA: Prostate Specific Ag, Serum: <0.1 ng/mL (ref 0.0–4.0)

## 2022-10-25 ENCOUNTER — Ambulatory Visit: Payer: Medicare Other | Admitting: Urology

## 2022-10-25 ENCOUNTER — Encounter: Payer: Self-pay | Admitting: Urology

## 2022-10-25 VITALS — BP 101/68 | HR 84

## 2022-10-25 DIAGNOSIS — N401 Enlarged prostate with lower urinary tract symptoms: Secondary | ICD-10-CM

## 2022-10-25 DIAGNOSIS — C61 Malignant neoplasm of prostate: Secondary | ICD-10-CM | POA: Diagnosis not present

## 2022-10-25 DIAGNOSIS — N138 Other obstructive and reflux uropathy: Secondary | ICD-10-CM | POA: Diagnosis not present

## 2022-10-25 DIAGNOSIS — R3912 Poor urinary stream: Secondary | ICD-10-CM

## 2022-10-25 DIAGNOSIS — N5201 Erectile dysfunction due to arterial insufficiency: Secondary | ICD-10-CM

## 2022-10-25 LAB — URINALYSIS, ROUTINE W REFLEX MICROSCOPIC
Bilirubin, UA: NEGATIVE
Glucose, UA: NEGATIVE
Ketones, UA: NEGATIVE
Leukocytes,UA: NEGATIVE
Nitrite, UA: NEGATIVE
Protein,UA: NEGATIVE
RBC, UA: NEGATIVE
Specific Gravity, UA: <1.005 — ABNORMAL LOW (ref 1.005–1.030)
Urobilinogen, Ur: 0.2 mg/dL (ref 0.2–1.0)
pH, UA: 5.5 (ref 5.0–7.5)

## 2022-10-25 MED ORDER — TADALAFIL 20 MG PO TABS: 20.0000 mg | ORAL_TABLET | ORAL | 5 refills | Status: DC | PRN

## 2022-10-25 MED ORDER — TERAZOSIN HCL 2 MG PO CAPS: 2.0000 mg | ORAL_CAPSULE | Freq: Every day | ORAL | 3 refills | Status: DC

## 2022-10-25 NOTE — Patient Instructions (Signed)
Benign Prostatic Hyperplasia ? ?Benign prostatic hyperplasia (BPH) is an enlarged prostate gland that is caused by the normal aging process. The prostate may get bigger as a man gets older. The condition is not caused by cancer. The prostate is a walnut-sized gland that is involved in the production of semen. It is located in front of the rectum and below the bladder. The bladder stores urine. The urethra carries stored urine out of the body. ?An enlarged prostate can press on the urethra. This can make it harder to pass urine. The buildup of urine in the bladder can cause infection. Back pressure and infection may progress to bladder damage and kidney (renal) failure. ?What are the causes? ?This condition is part of the normal aging process. However, not all men develop problems from this condition. If the prostate enlarges away from the urethra, urine flow will not be blocked. If it enlarges toward the urethra and compresses it, there will be problems passing urine. ?What increases the risk? ?This condition is more likely to develop in men older than 50 years. ?What are the signs or symptoms? ?Symptoms of this condition include: ?Getting up often during the night to urinate. ?Needing to urinate frequently during the day. ?Difficulty starting urine flow. ?Decrease in size and strength of your urine stream. ?Leaking (dribbling) after urinating. ?Inability to pass urine. This needs immediate treatment. ?Inability to completely empty your bladder. ?Pain when you pass urine. This is more common if there is also an infection. ?Urinary tract infection (UTI). ?How is this diagnosed? ?This condition is diagnosed based on your medical history, a physical exam, and your symptoms. Tests will also be done, such as: ?A post-void bladder scan. This measures any amount of urine that may remain in your bladder after you finish urinating. ?A digital rectal exam. In a rectal exam, your health care provider checks your prostate by  putting a lubricated, gloved finger into your rectum to feel the back of your prostate gland. This exam detects the size of your gland and any abnormal lumps or growths. ?An exam of your urine (urinalysis). ?A prostate specific antigen (PSA) screening. This is a blood test used to screen for prostate cancer. ?An ultrasound. This test uses sound waves to electronically produce a picture of your prostate gland. ?Your health care provider may refer you to a specialist in kidney and prostate diseases (urologist). ?How is this treated? ?Once symptoms begin, your health care provider will monitor your condition (active surveillance or watchful waiting). Treatment for this condition will depend on the severity of your condition. Treatment may include: ?Observation and yearly exams. This may be the only treatment needed if your condition and symptoms are mild. ?Medicines to relieve your symptoms, including: ?Medicines to shrink the prostate. ?Medicines to relax the muscle of the prostate. ?Surgery in severe cases. Surgery may include: ?Prostatectomy. In this procedure, the prostate tissue is removed completely through an open incision or with a laparoscope or robotics. ?Transurethral resection of the prostate (TURP). In this procedure, a tool is inserted through the opening at the tip of the penis (urethra). It is used to cut away tissue of the inner core of the prostate. The pieces are removed through the same opening of the penis. This removes the blockage. ?Transurethral incision (TUIP). In this procedure, small cuts are made in the prostate. This lessens the prostate's pressure on the urethra. ?Transurethral microwave thermotherapy (TUMT). This procedure uses microwaves to create heat. The heat destroys and removes a small amount of   prostate tissue. ?Transurethral needle ablation (TUNA). This procedure uses radio frequencies to destroy and remove a small amount of prostate tissue. ?Interstitial laser coagulation (ILC).  This procedure uses a laser to destroy and remove a small amount of prostate tissue. ?Transurethral electrovaporization (TUVP). This procedure uses electrodes to destroy and remove a small amount of prostate tissue. ?Prostatic urethral lift. This procedure inserts an implant to push the lobes of the prostate away from the urethra. ?Follow these instructions at home: ?Take over-the-counter and prescription medicines only as told by your health care provider. ?Monitor your symptoms for any changes. Contact your health care provider with any changes. ?Avoid drinking large amounts of liquid before going to bed or out in public. ?Avoid or reduce how much caffeine or alcohol you drink. ?Give yourself time when you urinate. ?Keep all follow-up visits. This is important. ?Contact a health care provider if: ?You have unexplained back pain. ?Your symptoms do not get better with treatment. ?You develop side effects from the medicine you are taking. ?Your urine becomes very dark or has a bad smell. ?Your lower abdomen becomes distended and you have trouble passing urine. ?Get help right away if: ?You have a fever or chills. ?You suddenly cannot urinate. ?You feel light-headed or very dizzy, or you faint. ?There are large amounts of blood or clots in your urine. ?Your urinary problems become hard to manage. ?You develop moderate to severe low back or flank pain. The flank is the side of your body between the ribs and the hip. ?These symptoms may be an emergency. Get help right away. Call 911. ?Do not wait to see if the symptoms will go away. ?Do not drive yourself to the hospital. ?Summary ?Benign prostatic hyperplasia (BPH) is an enlarged prostate that is caused by the normal aging process. It is not caused by cancer. ?An enlarged prostate can press on the urethra. This can make it hard to pass urine. ?This condition is more likely to develop in men older than 50 years. ?Get help right away if you suddenly cannot urinate. ?This  information is not intended to replace advice given to you by your health care provider. Make sure you discuss any questions you have with your health care provider. ?Document Revised: 03/09/2021 Document Reviewed: 03/09/2021 ?Elsevier Patient Education ? 2023 Else[employer] ? ?

## 2022-10-25 NOTE — Progress Notes (Signed)
10/25/2022 11:09 AM   David Schwartz March 02, 1946 [id number]  Referring provider: Dettinger, David Kaufmann, MD [place],  [place] [id number]  Followup BPh and prostate cancer   HPI: [name] is a 77yo here for followup for BPH and prostate cancer. PSA remain undetectable. He finished IMRT 09/2021. IPSS 9 QOL 2 on terazosin 107m daily. He continues to have erectile dysfunction but hs not taken tadalafil since last visit. No other complaints today   PMH: Past Medical History:  Diagnosis Date   Arthritis    [place]    Cancer ([facility]    Complication of anesthesia    difficult waking up   Dysrhythmia    Hyperlipidemia    Hypertension     Surgical History: Past Surgical History:  Procedure Laterality Date   CARDIOVERSION N/A 01/29/2020   Procedure: CARDIOVERSION;  Surgeon: MSatira Sark MD;  Location: [facility];  Service: Cardiovascular;  Laterality: N/A;   CARDIOVERSION N/A 06/16/2022   Procedure: CARDIOVERSION;  Surgeon: CWerner Lean MD;  Location: [facility];  Service: Cardiovascular;  Laterality: N/A;   GOLD SEED IMPLANT N/A 06/23/2021   Procedure: GOLD SEED IMPLANT;  Surgeon: David Gustin MD;  Location: [facility];  Service: Urology;  Laterality: N/A;   HERNIA REPAIR     KNEE ARTHROSCOPY     SPACE OAR INSTILLATION N/A 06/23/2021   Procedure: SPACE OAR INSTILLATION;  Surgeon: David Gustin MD;  Location: [facility];  Service: Urology;  Laterality: N/A;    Home Medications:  Allergies as of 10/25/2022   No Known Allergies      Medication List        Accurate as of October 25, 2022 11:09 AM. If you have any questions, ask your nurse or doctor.          alendronate 70 MG tablet Commonly known as: FOSAMAX TAKE 1 TABLET BY MOUTH WEEKLY  WITH 8 OZ OF PLAIN WATER 30  MINUTES BEFORE FIRST FOOD, DRINK OR MEDS. STAY UPRIGHT FOR 30  MINS   calcium carbonate 1500 (600 Ca) MG Tabs tablet Commonly known as: OSCAL Take 600 mg of  elemental calcium by mouth daily with breakfast.   Eliquis 5 MG Tabs tablet Generic drug: apixaban Take 1 tablet by mouth twice daily   losartan 25 MG tablet Commonly known as: COZAAR Take 1 tablet by mouth once daily   metoprolol succinate 50 MG 24 hr tablet Commonly known as: TOPROL-XL Take 1.5 tablets (75 mg total) by mouth daily.   rosuvastatin 5 MG tablet Commonly known as: CRESTOR Take 1 tablet by mouth daily   tadalafil 20 MG tablet Commonly known as: CIALIS Take 1 tablet (20 mg total) by mouth as needed for erectile dysfunction.   terazosin 2 MG capsule Commonly known as: HYTRIN Take 1 capsule (2 mg total) by mouth at bedtime.   triamcinolone cream 0.1 % Commonly known as: KENALOG Apply 1 Application topically 2 (two) times daily.   Vitamin D3 50 MCG (2000 UT) Tabs Generic drug: Cholecalciferol Take 6,000 Units by mouth daily.        Allergies: No Known Allergies  Family History: Family History  Problem Relation Age of Onset   Diabetes Mother    Hypertension Mother    Early death Father    Arthritis Maternal Grandmother    Hypertension Maternal Grandfather    Hypertension Paternal Grandmother    Cancer Paternal Grandfather        lung  Social History:  reports that he has never smoked. He has never used smokeless tobacco. He reports current alcohol use. He reports that he does not use drugs.  ROS: All other review of systems were reviewed and are negative except what is noted above in HPI  Physical Exam: BP 101/68   Pulse 84   Constitutional:  Alert and oriented, No acute distress. HEENT: [place] AT, moist mucus membranes.  Trachea midline, no masses. Cardiovascular: No clubbing, cyanosis, or edema. Respiratory: Normal respiratory effort, no increased work of breathing. GI: Abdomen is soft, nontender, nondistended, no abdominal masses GU: No CVA tenderness.  Lymph: No cervical or inguinal lymphadenopathy. Skin: No rashes, bruises or suspicious  lesions. Neurologic: Grossly intact, no focal deficits, moving all 4 extremities. Psychiatric: Normal mood and affect.  Laboratory Data: Lab Results  Component Value Date   WBC 5.6 10/12/2022   HGB 14.5 10/12/2022   HCT 41.3 10/12/2022   MCV 91 10/12/2022   PLT 198 10/12/2022    Lab Results  Component Value Date   CREATININE 0.85 10/12/2022    No results found for: "PSA"  No results found for: "TESTOSTERONE"  No results found for: "HGBA1C"  Urinalysis    Component Value Date/Time   APPEARANCEUR Clear 04/26/2022 1056   GLUCOSEU Negative 04/26/2022 1056   BILIRUBINUR Negative 04/26/2022 1056   PROTEINUR Negative 04/26/2022 1056   NITRITE Negative 04/26/2022 1056   LEUKOCYTESUR Negative 04/26/2022 1056    Lab Results  Component Value Date   LABMICR Comment 04/26/2022    Pertinent Imaging:  No results found for this or any previous visit.  No results found for this or any previous visit.  No results found for this or any previous visit.  No results found for this or any previous visit.  No results found for this or any previous visit.  No valid procedures specified. No results found for this or any previous visit.  No results found for this or any previous visit.   Assessment & Plan:    1. Prostate cancer ([place]) -followup 6 months with PSA - Urinalysis, Routine w reflex microscopic  2. Weak urinary stream -continue terazosin 9m daily  3. Benign prostatic hyperplasia with urinary obstruction -continue terazosin 267mdaily  4. Erectile dysfunction due to arterial insufficiency -tadalafil [id number]   No follow-ups on file.  PatNicolette BangD  [facility] [place]

## 2022-11-01 ENCOUNTER — Ambulatory Visit: Payer: Medicare Other | Attending: Nurse Practitioner | Admitting: Nurse Practitioner

## 2022-11-01 ENCOUNTER — Encounter: Payer: Self-pay | Admitting: Nurse Practitioner

## 2022-11-01 VITALS — BP 108/68 | HR 82 | Wt 178.0 lb

## 2022-11-01 DIAGNOSIS — E785 Hyperlipidemia, unspecified: Secondary | ICD-10-CM | POA: Diagnosis not present

## 2022-11-01 DIAGNOSIS — Z7901 Long term (current) use of anticoagulants: Secondary | ICD-10-CM | POA: Diagnosis not present

## 2022-11-01 DIAGNOSIS — I428 Other cardiomyopathies: Secondary | ICD-10-CM | POA: Diagnosis not present

## 2022-11-01 DIAGNOSIS — I480 Paroxysmal atrial fibrillation: Secondary | ICD-10-CM

## 2022-11-01 DIAGNOSIS — I5022 Chronic systolic (congestive) heart failure: Secondary | ICD-10-CM

## 2022-11-01 NOTE — Patient Instructions (Signed)
Medication Instructions:  Your physician recommends that you continue on your current medications as directed. Please refer to the Current Medication list given to you today.   Labwork: None today  Testing/Procedures: None today  Follow-Up: 6 months with Dr.Branch  Any Other Special Instructions Will Be Listed Below (If Applicable).  If you need a refill on your cardiac medications before your next appointment, please call your pharmacy.

## 2022-11-01 NOTE — Progress Notes (Signed)
Cardiology Office Note:    Date:  11/01/2022  ID:  David Schwartz, DOB [birth year], MRN [id number]  PCP:  Dettinger, Fransisca Kaufmann, MD   [facility] Providers Cardiologist:  Carlyle Dolly, MD     Referring MD: Dettinger, Fransisca Kaufmann, MD   CC: Here for follow-up  History of Present Illness:    David Schwartz is a 77 y.o. male with a hx of the following:  Paroxysmal A-fib HFimpEF HLD Hx of prostate cancer  First diagnosed with A-fib in 2021.  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  TTE 3 months later revealed improvement in EF to 60 to 65%.  Noted recurrence of A-fib in March 2023, but did not report symptoms, rate controlled at that time.  Underwent successful cardioversion of A-fib on June 16, 2022. Returned to A-fib when I saw pt on 08/08/2022. Was referred to A-fib clinic. Saw Dr. Myles Gip 10/2022 and ablation discussed, and pt was agreeable to proceed - scheduled for 01/04/2023.  Today he says he is doing well. Denies any chest pain, shortness of breath, palpitations, syncope, presyncope, dizziness, orthopnea, PND, swelling or significant weight changes, acute bleeding, or claudication.  SH: He is a [place] veteran, worked in Office manager. Stays very active around the house and enjoys working on cars.   Past Medical History:  Diagnosis Date   Arthritis    Atrial fibrillation ([place])    Cancer ([place])    Complication of anesthesia    difficult waking up   Dysrhythmia    Hyperlipidemia    Hypertension     Past Surgical History:  Procedure Laterality Date   CARDIOVERSION N/A 01/29/2020   Procedure: CARDIOVERSION;  Surgeon: Satira Sark, MD;  Location: [facility];  Service: Cardiovascular;  Laterality: N/A;   CARDIOVERSION N/A 06/16/2022   Procedure: CARDIOVERSION;  Surgeon: Werner Lean, MD;  Location: [facility];   Service: Cardiovascular;  Laterality: N/A;   GOLD SEED IMPLANT N/A 06/23/2021   Procedure: GOLD SEED IMPLANT;  Surgeon: Cleon Gustin, MD;  Location: [facility];  Service: Urology;  Laterality: N/A;   HERNIA REPAIR     KNEE ARTHROSCOPY     SPACE OAR INSTILLATION N/A 06/23/2021   Procedure: SPACE OAR INSTILLATION;  Surgeon: Cleon Gustin, MD;  Location: [facility];  Service: Urology;  Laterality: N/A;    Current Meds  Medication Sig   alendronate (FOSAMAX) 70 MG tablet TAKE 1 TABLET BY MOUTH WEEKLY  WITH 8 OZ OF PLAIN WATER 30  MINUTES BEFORE FIRST FOOD, DRINK OR MEDS. STAY UPRIGHT FOR 30  MINS   calcium carbonate (OSCAL) 1500 (600 Ca) MG TABS tablet Take 600 mg of elemental calcium by mouth daily with breakfast.   Cholecalciferol (VITAMIN D3) 50 MCG (2000 UT) TABS Take 6,000 Units by mouth daily.   ELIQUIS 5 MG TABS tablet Take 1 tablet by mouth twice daily   losartan (COZAAR) 25 MG tablet Take 1 tablet by mouth once daily   metoprolol succinate (TOPROL-XL) 50 MG 24 hr tablet Take 1.5 tablets (75 mg total) by mouth daily.   rosuvastatin (CRESTOR) 5 MG tablet Take 1 tablet by mouth daily   tadalafil (CIALIS) 20 MG tablet Take 1 tablet (20 mg total) by mouth as needed for erectile dysfunction.   terazosin (HYTRIN) 2 MG capsule Take 1 capsule (2 mg total) by mouth at bedtime.  triamcinolone cream (KENALOG) 0.1 % Apply 1 Application topically 2 (two) times daily.    Allergies:   Patient has no known allergies.   Social History   Socioeconomic History   Marital status: Married    Spouse name: David Schwartz   Number of children: Not on file   Years of education: 12   Highest education level: 12th grade  Occupational History   Occupation: Retired  Tobacco Use   Smoking status: Never   Smokeless tobacco: Never  Vaping Use   Vaping Use: Never used  Substance and Sexual Activity   Alcohol use: Yes    Comment: occ   Drug use: Never   Sexual activity: Not on file  Other Topics Concern    Not on file  Social History Narrative   Living with wife - son lives nearby   Social Determinants of Health   Financial Resource Strain: [place]  (01/05/2022)   Overall Financial Resource Strain (CARDIA)    Difficulty of Paying Living Expenses: Not hard at all  Food Insecurity: No [place] (01/05/2022)   Hunger Vital Sign    Worried About Running Out of Food in the Last Year: Never true    [place] in the Last Year: Never true  Transportation Needs: No Transportation Needs (01/05/2022)   PRAPARE - Hydrologist (Medical): No    Lack of Transportation (Non-Medical): No  Physical Activity: Sufficiently Active (01/05/2022)   Exercise Vital Sign    Days of Exercise per Week: 7 days    Minutes of Exercise per Session: 30 min  Stress: No Stress Concern Present (01/05/2022)   [place]    Feeling of Stress : Not at all  Social Connections: Socially Integrated (01/05/2022)   Social Connection and Isolation Panel [NHANES]    Frequency of Communication with Friends and Family: More than three times a week    Frequency of Social Gatherings with Friends and Family: More than three times a week    Attends Religious Services: More than 4 times per year    Active Member of [employer] or Organizations: Yes    Attends Music therapist: More than 4 times per year    Marital Status: Married     Family History: The patient's family history includes Arthritis in his maternal grandmother; Cancer in his paternal grandfather; Diabetes in his mother; Early death in his father; Hypertension in his maternal grandfather, mother, and paternal grandmother.  ROS:   Review of Systems  Constitutional:  Positive for malaise/fatigue. Negative for chills, diaphoresis, fever and weight loss.  HENT: Negative.    Eyes: Negative.   Respiratory: Negative.    Cardiovascular: Negative.   Gastrointestinal:  Negative.   Genitourinary: Negative.   Musculoskeletal: Negative.   Skin: Negative.   Neurological: Negative.   Endo/Heme/Allergies: Negative.   Psychiatric/Behavioral: Negative.      Please see the history of present illness.    All other systems reviewed and are negative.  EKGs/Labs/Other Studies Reviewed:    The following studies were reviewed today:   EKG:  EKG is not ordered today.    2D echocardiogram on April 22, 2020:  1. Left ventricular ejection fraction, by estimation, is 60 to 65%. The  left ventricle has normal function. The left ventricle has no regional  wall motion abnormalities. Left ventricular diastolic parameters are  consistent with Grade I diastolic  dysfunction (impaired relaxation).   2. Right  ventricular systolic function is normal. The right ventricular  size is normal. There is normal pulmonary artery systolic pressure.   3. Left atrial size was moderately dilated.   4. The mitral valve is normal in structure. No evidence of mitral valve  regurgitation. No evidence of mitral stenosis.   5. The aortic valve is tricuspid. Aortic valve regurgitation is mild. No  aortic stenosis is present.   6. The inferior vena cava is normal in size with greater than 50%  respiratory variability, suggesting right atrial pressure of 3 mmHg.   Comparison(s): Echocardiogram done 01/28/20 showed an EF of 35-40%.  Recent Labs: 08/10/2022: TSH 1.360 10/12/2022: ALT 11; BUN 17; Creatinine, Ser 0.85; Hemoglobin 14.5; Platelets 198; Potassium 5.1; Sodium 141  Recent Lipid Panel    Component Value Date/Time   CHOL 167 10/12/2022 1006   TRIG 114 10/12/2022 1006   HDL 52 10/12/2022 1006   CHOLHDL 3.2 10/12/2022 1006   LDLCALC 95 10/12/2022 1006     Risk Assessment/Calculations:    CHA2DS2-VASc Score = 3  This indicates a 3.2% annual risk of stroke. The patient's score is based upon: CHF History: 1 HTN History: 0 Diabetes History: 0 Stroke History: 0 Vascular  Disease History: 0 Age Score: 2 Gender Score: 0   Physical Exam:    VS:  BP 108/68   Pulse 82   Wt 178 lb (80.7 kg)   SpO2 98%   BMI 27.06 kg/m     Wt Readings from Last 3 Encounters:  11/01/22 178 lb (80.7 kg)  10/12/22 177 lb (80.3 kg)  10/09/22 179 lb (81.2 kg)     GEN: Well nourished, well developed in no acute distress HEENT: Normal NECK: No JVD; No carotid bruits CARDIAC: S1/S2, irregular rhythm and regular rate, no murmurs, rubs, gallops; 2+ peripheral pulses  RESPIRATORY:  Clear and diminished to auscultation without rales, wheezing or rhonchi  MUSCULOSKELETAL:  No edema; No deformity  SKIN: Thin skin, warm and dry NEUROLOGIC:  Alert and oriented x 3 PSYCHIATRIC:  Normal affect  ASSESSMENT:    1. Paroxysmal A-fib ([place])   2. Current use of long term anticoagulation   3. Heart failure with improved ejection fraction (HFimpEF) ([place])   4. NICM (nonischemic cardiomyopathy) ([place])   5. Hyperlipidemia, unspecified hyperlipidemia type     PLAN:    In order of problems listed above:  Paroxysmal A-fib, long term anticoagulation Denies any palpitations or tachycardia. Hx of two previous DCCV. CHA2DS2-VASc score 3. Continue Eliquis 5 mg twice daily, he is on appropriate dosage for this. Heart healthy diet and regular cardiovascular as tolerated exercise encouraged. Scheduled for upcoming ablation 01/2023.  HFimpEF, NICM TTE 08/2022 showed normal EF. Euvolemic and well compensated on exam. Low sodium diet, fluid restriction <2L, and daily weights encouraged. Educated to contact our office for weight gain of 2 lbs overnight or 5 lbs in one week.  Continue current medication regimen. Heart healthy diet and regular cardiovascular exercise as tolerated encouraged.   HLD Labs from August 2023 revealed total cholesterol 168, HDL 49, LDL 93, and triglycerides 149. Continue rosuvastatin. Heart healthy diet and regular cardiovascular exercise as tolerated encouraged.   Disposition:  Follow-up with Dr. Carlyle Dolly in 6 months or sooner if anything changes.   Medication Adjustments/Labs and Tests Ordered: Current medicines are reviewed at length with the patient today.  Concerns regarding medicines are outlined above.  No orders of the defined types were placed in this encounter.  No orders of the defined  types were placed in this encounter.   Patient Instructions  Medication Instructions:  Your physician recommends that you continue on your current medications as directed. Please refer to the Current Medication list given to you today.   Labwork: None today  Testing/Procedures: None today  Follow-Up: 6 months with Dr.Branch  Any Other Special Instructions Will Be Listed Below (If Applicable).  If you need a refill on your cardiac medications before your next appointment, please call your pharmacy.    Signed, Finis Bud, NP  11/03/2022 9:53 PM    [place]

## 2022-11-03 ENCOUNTER — Ambulatory Visit: Payer: Medicare Other | Admitting: Nurse Practitioner

## 2022-11-15 ENCOUNTER — Other Ambulatory Visit: Payer: Self-pay | Admitting: Urology

## 2022-11-15 DIAGNOSIS — R3912 Poor urinary stream: Secondary | ICD-10-CM

## 2022-11-19 ENCOUNTER — Other Ambulatory Visit: Payer: Self-pay | Admitting: Family Medicine

## 2022-12-20 ENCOUNTER — Ambulatory Visit: Payer: Medicare Other | Attending: Cardiovascular Disease

## 2022-12-20 DIAGNOSIS — Z01812 Encounter for preprocedural laboratory examination: Secondary | ICD-10-CM

## 2022-12-20 DIAGNOSIS — I4891 Unspecified atrial fibrillation: Secondary | ICD-10-CM | POA: Diagnosis not present

## 2022-12-21 LAB — BASIC METABOLIC PANEL
BUN/Creatinine Ratio: 22 (ref 10–24)
BUN: 17 mg/dL (ref 8–27)
CO2: 22 mmol/L (ref 20–29)
Calcium: 9.6 mg/dL (ref 8.6–10.2)
Chloride: 103 mmol/L (ref 96–106)
Creatinine, Ser: 0.77 mg/dL (ref 0.76–1.27)
Glucose: 80 mg/dL (ref 70–99)
Potassium: 4.5 mmol/L (ref 3.5–5.2)
Sodium: 141 mmol/L (ref 134–144)
eGFR: 92 mL/min/{1.73_m2} (ref >59)

## 2022-12-21 LAB — CBC WITH DIFFERENTIAL/PLATELET
Basophils Absolute: 0 10*3/uL (ref 0.0–0.2)
Basos: 1 %
EOS (ABSOLUTE): 0.2 10*3/uL (ref 0.0–0.4)
Eos: 4 %
Hematocrit: 42 % (ref 37.5–51.0)
Hemoglobin: 14.6 g/dL (ref 13.0–17.7)
Immature Grans (Abs): 0 10*3/uL (ref 0.0–0.1)
Immature Granulocytes: 0 %
Lymphocytes Absolute: 1.4 10*3/uL (ref 0.7–3.1)
Lymphs: 23 %
MCH: 32.4 pg (ref 26.6–33.0)
MCHC: 34.8 g/dL (ref 31.5–35.7)
MCV: 93 fL (ref 79–97)
Monocytes Absolute: 0.6 10*3/uL (ref 0.1–0.9)
Monocytes: 9 %
Neutrophils Absolute: 3.8 10*3/uL (ref 1.4–7.0)
Neutrophils: 63 %
Platelets: 194 10*3/uL (ref 150–450)
RBC: 4.51 x10E6/uL (ref 4.14–5.80)
RDW: 13.2 % (ref 11.6–15.4)
WBC: 6 10*3/uL (ref 3.4–10.8)

## 2022-12-27 ENCOUNTER — Telehealth (HOSPITAL_COMMUNITY): Payer: Self-pay | Admitting: *Deleted

## 2022-12-27 ENCOUNTER — Telehealth (HOSPITAL_COMMUNITY): Payer: Self-pay | Admitting: Emergency Medicine

## 2022-12-27 NOTE — Telephone Encounter (Signed)
Patient returning call about upcoming cardiac imaging study; pt verbalizes understanding of appt date/time, parking situation and where to check in, pre-test NPO status and verified current allergies; name and call back number provided for further questions should they arise  Larey Brick RN Navigator Cardiac Imaging [facility] [phone] office [phone] cell  Patient to take his daily medications. He is aware to arrive at 1pm.

## 2022-12-27 NOTE — Telephone Encounter (Signed)
Attempted to call patient regarding upcoming cardiac CT appointment. °Left message on voicemail with name and callback number °David Divelbiss RN Navigator Cardiac Imaging °[facility] °[phone] Office °[phone] Cell ° °

## 2022-12-28 ENCOUNTER — Ambulatory Visit (HOSPITAL_COMMUNITY)
Admission: RE | Admit: 2022-12-28 | Discharge: 2022-12-28 | Disposition: A | Discharge status: Home / Self Care | Payer: Medicare Other | Source: Ambulatory Visit | Attending: Cardiovascular Disease | Admitting: Cardiovascular Disease

## 2022-12-28 DIAGNOSIS — I4819 Other persistent atrial fibrillation: Secondary | ICD-10-CM | POA: Insufficient documentation

## 2022-12-28 MED ORDER — IOHEXOL 350 MG/ML SOLN
100.0000 mL | Freq: Once | INTRAVENOUS | Status: AC | PRN
  Administered 2022-12-28: 100 mL via INTRAVENOUS

## 2023-01-03 NOTE — Pre-Procedure Instructions (Signed)
Instructed patient on the following items: Arrival time 0730 Nothing to eat or drink after midnight No meds AM of procedure Responsible person to drive you home and stay with you for 24 hrs  Have you missed any doses of anti-coagulant Eliquis- takes twice a day, hasn't missed any doses.  Don't take dose in the morning.

## 2023-01-04 ENCOUNTER — Encounter (HOSPITAL_COMMUNITY)
Admission: RE | Disposition: A | Discharge status: Home / Self Care | Payer: Self-pay | Source: Home / Self Care | Attending: Cardiovascular Disease

## 2023-01-04 ENCOUNTER — Other Ambulatory Visit: Payer: Self-pay

## 2023-01-04 ENCOUNTER — Ambulatory Visit (HOSPITAL_BASED_OUTPATIENT_CLINIC_OR_DEPARTMENT_OTHER): Payer: Medicare Other | Admitting: Certified Registered Nurse Anesthetist

## 2023-01-04 ENCOUNTER — Ambulatory Visit (HOSPITAL_COMMUNITY): Payer: Medicare Other | Admitting: Certified Registered Nurse Anesthetist

## 2023-01-04 ENCOUNTER — Ambulatory Visit (HOSPITAL_BASED_OUTPATIENT_CLINIC_OR_DEPARTMENT_OTHER): Payer: Medicare Other

## 2023-01-04 ENCOUNTER — Ambulatory Visit (HOSPITAL_COMMUNITY)
Admission: RE | Admit: 2023-01-04 | Discharge: 2023-01-04 | Disposition: A | Discharge status: Home / Self Care | Payer: Medicare Other | Attending: Cardiovascular Disease | Admitting: Cardiovascular Disease

## 2023-01-04 ENCOUNTER — Other Ambulatory Visit (HOSPITAL_COMMUNITY): Payer: Self-pay

## 2023-01-04 DIAGNOSIS — I340 Nonrheumatic mitral (valve) insufficiency: Secondary | ICD-10-CM | POA: Diagnosis not present

## 2023-01-04 DIAGNOSIS — I4891 Unspecified atrial fibrillation: Secondary | ICD-10-CM

## 2023-01-04 DIAGNOSIS — Z79899 Other long term (current) drug therapy: Secondary | ICD-10-CM | POA: Insufficient documentation

## 2023-01-04 DIAGNOSIS — I4819 Other persistent atrial fibrillation: Secondary | ICD-10-CM

## 2023-01-04 DIAGNOSIS — I081 Rheumatic disorders of both mitral and tricuspid valves: Secondary | ICD-10-CM

## 2023-01-04 DIAGNOSIS — Z7901 Long term (current) use of anticoagulants: Secondary | ICD-10-CM | POA: Insufficient documentation

## 2023-01-04 DIAGNOSIS — I110 Hypertensive heart disease with heart failure: Secondary | ICD-10-CM | POA: Diagnosis not present

## 2023-01-04 DIAGNOSIS — I10 Essential (primary) hypertension: Secondary | ICD-10-CM | POA: Diagnosis not present

## 2023-01-04 DIAGNOSIS — R5383 Other fatigue: Secondary | ICD-10-CM | POA: Diagnosis not present

## 2023-01-04 DIAGNOSIS — I5022 Chronic systolic (congestive) heart failure: Secondary | ICD-10-CM | POA: Insufficient documentation

## 2023-01-04 HISTORY — PX: TEE WITHOUT CARDIOVERSION: SHX5443

## 2023-01-04 HISTORY — PX: ATRIAL FIBRILLATION ABLATION: EP1191

## 2023-01-04 SURGERY — ATRIAL FIBRILLATION ABLATION
Anesthesia: General

## 2023-01-04 MED ORDER — LIDOCAINE 2% (20 MG/ML) 5 ML SYRINGE
INTRAMUSCULAR | Status: DC | PRN
  Administered 2023-01-04: 100 mg via INTRAVENOUS

## 2023-01-04 MED ORDER — ACETAMINOPHEN 325 MG PO TABS: 650.0000 mg | ORAL_TABLET | ORAL | Status: DC | PRN

## 2023-01-04 MED ORDER — ONDANSETRON HCL 4 MG/2ML IJ SOLN
INTRAMUSCULAR | Status: DC | PRN
  Administered 2023-01-04: 4 mg via INTRAVENOUS

## 2023-01-04 MED ORDER — PROPOFOL 10 MG/ML IV BOLUS
INTRAVENOUS | Status: DC | PRN
  Administered 2023-01-04: 200 mg via INTRAVENOUS

## 2023-01-04 MED ORDER — SODIUM CHLORIDE 0.9% FLUSH: 3.0000 mL | INTRAVENOUS | Status: DC | PRN

## 2023-01-04 MED ORDER — DOBUTAMINE INFUSION FOR EP/ECHO/NUC (1000 MCG/ML)
INTRAVENOUS | Status: DC | PRN
  Administered 2023-01-04: 20 ug/kg/min via INTRAVENOUS

## 2023-01-04 MED ORDER — PHENYLEPHRINE HCL-NACL 20-0.9 MG/250ML-% IV SOLN
INTRAVENOUS | Status: DC | PRN
  Administered 2023-01-04: 50 ug/min via INTRAVENOUS

## 2023-01-04 MED ORDER — SUGAMMADEX SODIUM 200 MG/2ML IV SOLN
INTRAVENOUS | Status: DC | PRN
  Administered 2023-01-04: 200 mg via INTRAVENOUS

## 2023-01-04 MED ORDER — SODIUM CHLORIDE 0.9 % IV SOLN: 250.0000 mL | INTRAVENOUS | Status: DC | PRN

## 2023-01-04 MED ORDER — PANTOPRAZOLE SODIUM 40 MG PO TBEC
40.0000 mg | DELAYED_RELEASE_TABLET | Freq: Every day | ORAL | 0 refills | Status: DC
  Filled 2023-01-04: qty 30, 30d supply, fill #0

## 2023-01-04 MED ORDER — COLCHICINE 0.6 MG PO TABS
0.6000 mg | ORAL_TABLET | Freq: Two times a day (BID) | ORAL | 0 refills | Status: DC
  Filled 2023-01-04: qty 10, 5d supply, fill #0

## 2023-01-04 MED ORDER — HEPARIN SODIUM (PORCINE) 1000 UNIT/ML IJ SOLN
INTRAMUSCULAR | Status: AC
  Filled 2023-01-04: qty 10

## 2023-01-04 MED ORDER — ROCURONIUM BROMIDE 10 MG/ML (PF) SYRINGE
PREFILLED_SYRINGE | INTRAVENOUS | Status: DC | PRN
  Administered 2023-01-04: 70 mg via INTRAVENOUS

## 2023-01-04 MED ORDER — HEPARIN (PORCINE) IN NACL 1000-0.9 UT/500ML-% IV SOLN
INTRAVENOUS | Status: DC | PRN
  Administered 2023-01-04 (×4): 500 mL

## 2023-01-04 MED ORDER — PROTAMINE SULFATE 10 MG/ML IV SOLN
INTRAVENOUS | Status: DC | PRN
  Administered 2023-01-04: 20 mg via INTRAVENOUS
  Administered 2023-01-04 (×2): 10 mg via INTRAVENOUS

## 2023-01-04 MED ORDER — SODIUM CHLORIDE 0.9% FLUSH: 3.0000 mL | Freq: Two times a day (BID) | INTRAVENOUS | Status: DC

## 2023-01-04 MED ORDER — DOBUTAMINE INFUSION FOR EP/ECHO/NUC (1000 MCG/ML)
INTRAVENOUS | Status: AC
  Filled 2023-01-04: qty 250

## 2023-01-04 MED ORDER — HEPARIN SODIUM (PORCINE) 1000 UNIT/ML IJ SOLN
INTRAMUSCULAR | Status: DC | PRN
  Administered 2023-01-04: 2000 [IU] via INTRAVENOUS
  Administered 2023-01-04: 15000 [IU] via INTRAVENOUS

## 2023-01-04 MED ORDER — EPHEDRINE SULFATE-NACL 50-0.9 MG/10ML-% IV SOSY
PREFILLED_SYRINGE | INTRAVENOUS | Status: DC | PRN
  Administered 2023-01-04 (×3): 5 mg via INTRAVENOUS

## 2023-01-04 MED ORDER — SODIUM CHLORIDE 0.9 % IV SOLN: INTRAVENOUS | Status: DC

## 2023-01-04 MED ORDER — LACTATED RINGERS IV SOLN: INTRAVENOUS | Status: DC | PRN

## 2023-01-04 MED ORDER — FENTANYL CITRATE (PF) 250 MCG/5ML IJ SOLN
INTRAMUSCULAR | Status: DC | PRN
  Administered 2023-01-04: 50 ug via INTRAVENOUS

## 2023-01-04 MED ORDER — ONDANSETRON HCL 4 MG/2ML IJ SOLN: 4.0000 mg | Freq: Four times a day (QID) | INTRAMUSCULAR | Status: DC | PRN

## 2023-01-04 MED ORDER — ONDANSETRON HCL 4 MG/2ML IJ SOLN: 4.0000 mg | Freq: Once | INTRAMUSCULAR | Status: DC | PRN

## 2023-01-04 MED ORDER — HEPARIN SODIUM (PORCINE) 1000 UNIT/ML IJ SOLN
INTRAMUSCULAR | Status: DC | PRN
  Administered 2023-01-04: 1000 [IU] via INTRAVENOUS

## 2023-01-04 MED ORDER — PHENYLEPHRINE HCL (PRESSORS) 10 MG/ML IV SOLN
INTRAVENOUS | Status: DC | PRN
  Administered 2023-01-04: 160 ug via INTRAVENOUS
  Administered 2023-01-04 (×2): 240 ug via INTRAVENOUS

## 2023-01-04 SURGICAL SUPPLY — 21 items
BLANKET WARM UNDERBOD FULL ACC (MISCELLANEOUS) ×1 IMPLANT
CATH 8FR REPROCESSED SOUNDSTAR (CATHETERS) ×1 IMPLANT
CATH 8FR SOUNDSTAR REPROCESSED (CATHETERS) IMPLANT
CATH ABLAT QDOT MICRO BI TC DF (CATHETERS) IMPLANT
CATH OCTARAY 2.0 F 3-3-3-3-3 (CATHETERS) IMPLANT
CATH PIGTAIL STEERABLE D1 8.7 (WIRE) IMPLANT
CATH S-M CIRCA TEMP PROBE (CATHETERS) IMPLANT
CATH WEB BI DIR CSDF CRV REPRO (CATHETERS) IMPLANT
CLOSURE PERCLOSE PROSTYLE (VASCULAR PRODUCTS) IMPLANT
COVER SWIFTLINK CONNECTOR (BAG) ×1 IMPLANT
DEVICE CLOSURE MYNXGRIP 5F (Vascular Products) IMPLANT
DEVICE CLOSURE MYNXGRIP 6/7F (Vascular Products) IMPLANT
PACK EP LATEX FREE (CUSTOM PROCEDURE TRAY) ×1
PACK EP LF (CUSTOM PROCEDURE TRAY) ×1 IMPLANT
PAD DEFIB RADIO PHYSIO CONN (PAD) ×1 IMPLANT
PATCH CARTO3 (PAD) IMPLANT
SHEATH 9FR PRELUDE SNAP 13 (SHEATH) IMPLANT
SHEATH CARTO VIZIGO MED CURVE (SHEATH) IMPLANT
SHEATH PINNACLE 8F 10CM (SHEATH) IMPLANT
SHEATH PROBE COVER 6X72 (BAG) IMPLANT
TUBING SMART ABLATE COOLFLOW (TUBING) IMPLANT

## 2023-01-04 NOTE — H&P (Signed)
Electrophysiology Office Note:    Date:  01/04/2023   ID:  David Schwartz, DOB 1945-11-28, MRN [id number]  PCP:  Dettinger, Elige Radon, MD   [facility] Providers Cardiologist:  Dina Rich, MD     Referring MD: No ref. provider found   History of Present Illness:    David Schwartz is a 77 y.o. male with a hx listed below, significant for atrial fibrillation, HFrEF referred for arrhythmia management.  He was first diagnosed with atrial fibrillation in 2021. HR was elevated on presentation. He was managed with metoprolol and eliquis, and eventually underwent DCCV. EF was 35-40% at the time of cardioversion but normalize with sinus rhythm and normal heart rate.  He had recurrence of AF in 2023 and underwent DCCV again in October after complaining of fatigue. He had recurrence of AF within a few weeks. He reports that he has increased fatigue when in AF and felt better after his cardioversions.  Otherwise, he has no chest pain, syncope, pre-syncope.  I reviewed the patient's CT and labs. There was no LAA thrombus. he  has not missed any doses of anticoagulation, and he took his dose last night. There have been no changes in the patient's diagnoses, medications, or condition since our recent clinic visit.   Past Medical History:  Diagnosis Date   Arthritis    Atrial fibrillation ([facility])    Cancer ([facility])    Complication of anesthesia    difficult waking up   Dysrhythmia    Hyperlipidemia    Hypertension     Past Surgical History:  Procedure Laterality Date   CARDIOVERSION N/A 01/29/2020   Procedure: CARDIOVERSION;  Surgeon: Jonelle Sidle, MD;  Location: [facility];  Service: Cardiovascular;  Laterality: N/A;   CARDIOVERSION N/A 06/16/2022   Procedure: CARDIOVERSION;  Surgeon: Christell Constant, MD;  Location: [facility];  Service: Cardiovascular;  Laterality: N/A;   GOLD SEED IMPLANT N/A 06/23/2021   Procedure: GOLD SEED IMPLANT;  Surgeon: Malen Gauze, MD;   Location: [facility];  Service: Urology;  Laterality: N/A;   HERNIA REPAIR     KNEE ARTHROSCOPY     SPACE OAR INSTILLATION N/A 06/23/2021   Procedure: SPACE OAR INSTILLATION;  Surgeon: Malen Gauze, MD;  Location: [facility];  Service: Urology;  Laterality: N/A;    Current Medications: Current Meds  Medication Sig   alendronate (FOSAMAX) 70 MG tablet TAKE 1 TABLET BY MOUTH WEEKLY  WITH 8 OZ OF PLAIN WATER 30  MINUTES BEFORE FIRST FOOD, DRINK OR MEDS. STAY UPRIGHT FOR 30  MINS   calcium carbonate (OSCAL) 1500 (600 Ca) MG TABS tablet Take 600 mg of elemental calcium by mouth daily with breakfast.   Cholecalciferol (VITAMIN D3) 50 MCG (2000 UT) TABS Take 6,000 Units by mouth daily.   ELIQUIS 5 MG TABS tablet Take 1 tablet by mouth twice daily   ketoconazole (NIZORAL) 2 % cream Apply 1 Application topically daily as needed for irritation.   losartan (COZAAR) 25 MG tablet Take 1 tablet by mouth once daily   metoprolol succinate (TOPROL-XL) 50 MG 24 hr tablet Take 1.5 tablets (75 mg total) by mouth daily.   Multiple Vitamin (MULTIVITAMIN WITH MINERALS) TABS tablet Take 1 tablet by mouth daily.   rosuvastatin (CRESTOR) 5 MG tablet Take 1 tablet by mouth daily   tadalafil (CIALIS) 20 MG tablet Take 1 tablet (20 mg total) by mouth as needed for erectile dysfunction.   terazosin (HYTRIN) 2 MG capsule TAKE 1  CAPSULE BY MOUTH AT  BEDTIME     Allergies:   Patient has no known allergies.   Social History   Socioeconomic History   Marital status: Married    Spouse name: Dois Davenport   Number of children: Not on file   Years of education: 12   Highest education level: 12th grade  Occupational History   Occupation: Retired  Tobacco Use   Smoking status: Never   Smokeless tobacco: Never  Vaping Use   Vaping Use: Never used  Substance and Sexual Activity   Alcohol use: Yes    Comment: occ   Drug use: Never   Sexual activity: Not on file  Other Topics Concern   Not on file  Social History  Narrative   Living with wife - son lives nearby   Social Determinants of Health   Financial Resource Strain: Low Risk  (01/05/2022)   Overall Financial Resource Strain (CARDIA)    Difficulty of Paying Living Expenses: Not hard at all  Food Insecurity: No Food Insecurity (01/05/2022)   Hunger Vital Sign    Worried About Running Out of Food in the Last Year: Never true    Ran Out of Food in the Last Year: Never true  Transportation Needs: No Transportation Needs (01/05/2022)   PRAPARE - Administrator, Civil Service (Medical): No    Lack of Transportation (Non-Medical): No  Physical Activity: Sufficiently Active (01/05/2022)   Exercise Vital Sign    Days of Exercise per Week: 7 days    Minutes of Exercise per Session: 30 min  Stress: No Stress Concern Present (01/05/2022)   [employer] of Occupational Health - Occupational Stress Questionnaire    Feeling of Stress : Not at all  Social Connections: Socially Integrated (01/05/2022)   Social Connection and Isolation Panel [NHANES]    Frequency of Communication with Friends and Family: More than three times a week    Frequency of Social Gatherings with Friends and Family: More than three times a week    Attends Religious Services: More than 4 times per year    Active Member of [employer] or Organizations: Yes    Attends Engineer, structural: More than 4 times per year    Marital Status: Married     Family History: The patient's family history includes Arthritis in his maternal grandmother; Cancer in his paternal grandfather; Diabetes in his mother; Early death in his father; Hypertension in his maternal grandfather, mother, and paternal grandmother.  ROS:   Please see the history of present illness.    All other systems reviewed and are negative.  EKGs/Labs/Other Studies Reviewed Today:     TTE: 08-22-2022 EF 50-55%, mildly dilated LA, normal mitral valve  EKG:  Last EKG results: today - AF I have reviewed all ECGs  available in [facility]. AF, when present, is coarse, and there appears to be good atrial electrical amplitude.  Recent Labs: 08/10/2022: TSH 1.360 10/12/2022: ALT 11 12/20/2022: BUN 17; Creatinine, Ser 0.77; Hemoglobin 14.6; Platelets 194; Potassium 4.5; Sodium 141     Physical Exam:    VS:  BP (!) 122/91   Pulse 93   Temp 98.2 F (36.8 C) (Temporal)   Resp 18   Ht 5\' 9"  (1.753 m)   Wt 78 kg   SpO2 98%   BMI 25.40 kg/m     Wt Readings from Last 3 Encounters:  01/04/23 78 kg  11/01/22 80.7 kg  10/12/22 80.3 kg     GEN: Well  nourished, well developed in no acute distress CARDIAC: Irregular rhythm, no murmurs, rubs, gallops RESPIRATORY:  Normal work of breathing MUSCULOSKELETAL: no edema    ASSESSMENT & PLAN:    Atrial fibrillation: persistent, symptomatic with fatigue.  We discussed management options including rhythm control with either medication or ablation versus leaving him in atrial fibrillation.  He would prefer rhythm control approach since he is symptomatic and does not want to take medications.  We have discussed the indication, rationale, logistics, anticipated benefits, and potential risks of the ablation procedure. He presents today for the procedure.  CHFrEF: likely due to tachycardia and AF. EF has recovered.        Medication Adjustments/Labs and Tests Ordered: Current medicines are reviewed at length with the patient today.  Concerns regarding medicines are outlined above.  Orders Placed This Encounter  Procedures   Informed Consent Details: Physician/Practitioner Attestation; Transcribe to consent form and obtain patient signature   Initiate Pre-op Protocol   [place] on call to EP Lab   Confirm CBC and BMP (or CMP) results within 7 days for inpatient and 30 days for outpatient:   Clip right and left femoral area PM before surgery   Clip right internal jugular area PM before surgery   Pre-admission testing diagnosis   Informed Consent Details:  Physician/Practitioner Attestation; Transcribe to consent form and obtain patient signature   EP STUDY   EP STUDY   ECHO TEE   Insert peripheral IV   Meds ordered this encounter  Medications   0.9 %  sodium chloride infusion     Signed, Maurice Small, MD  01/04/2023 10:11 AM    [facility]

## 2023-01-04 NOTE — Transfer of Care (Signed)
Immediate Anesthesia Transfer of Care Note  Patient: David Schwartz  Procedure(s) Performed: ATRIAL FIBRILLATION ABLATION TRANSESOPHAGEAL ECHOCARDIOGRAM  Patient Location: Cath Lab  Anesthesia Type:General  Level of Consciousness: awake, alert , patient cooperative, and responds to stimulation  Airway & Oxygen Therapy: Patient Spontanous Breathing and Patient connected to nasal cannula oxygen  Post-op Assessment: Report given to RN and Post -op Vital signs reviewed and stable  Post vital signs: Reviewed and stable  Last Vitals:  Vitals Value Taken Time  BP 88/58 01/04/23 1300  Temp    Pulse 79 01/04/23 1302  Resp 17 01/04/23 1302  SpO2 98 % 01/04/23 1302  Vitals shown include unvalidated device data.  Last Pain:  Vitals:   01/04/23 0803  TempSrc:   PainSc: 0-No pain         Complications: No notable events documented.

## 2023-01-04 NOTE — Anesthesia Postprocedure Evaluation (Signed)
Anesthesia Post Note  Patient: David Schwartz  Procedure(s) Performed: ATRIAL FIBRILLATION ABLATION TRANSESOPHAGEAL ECHOCARDIOGRAM     Patient location during evaluation: PACU Anesthesia Type: General Level of consciousness: awake and alert Pain management: pain level controlled Vital Signs Assessment: post-procedure vital signs reviewed and stable Respiratory status: spontaneous breathing, nonlabored ventilation, respiratory function stable and patient connected to nasal cannula oxygen Cardiovascular status: blood pressure returned to baseline and stable Postop Assessment: no apparent nausea or vomiting Anesthetic complications: no  No notable events documented.  Last Vitals:  Vitals:   01/04/23 1325 01/04/23 1330  BP: 93/71 (!) 87/62  Pulse: 74 75  Resp: 14 14  Temp:    SpO2: 96% 96%    Last Pain:  Vitals:   01/04/23 1315  TempSrc:   PainSc: 0-No pain                 Janautica Netzley S

## 2023-01-04 NOTE — Progress Notes (Signed)
Spoke to [place] in anesthesia about patients BP being on the lower side however consistently keeping a MAP above 65.

## 2023-01-04 NOTE — Discharge Instructions (Addendum)
Post procedure care instructions No driving for 4 days. No lifting over 5 lbs for 1 week. No vigorous or sexual activity for 1 week. You may return to work/your usual activities on 5/10/124. Keep procedure site clean & dry. If you notice increased pain, swelling, bleeding or pus, call/return!  You may shower after 24 hours, but no soaking in baths/hot tubs/pools for 1 week.    You have an appointment set up with the [facility]ibrillation [facility].  Multiple studies have shown that being followed by a dedicated atrial fibrillation clinic in addition to the standard care you receive from your other physicians improves health. We believe that enrollment in the atrial fibrillation clinic will allow [place] to better care for you.   The phone number to the Atrial Fibrillation Clinic is [phone]. The clinic is staffed Monday through Friday from 8:30am to 5pm.  Directions: The clinic is located in the [facility], 6TH FLOOR Enter the hospital at the MAIN ENTRANCE "A", use [employer] to the 6th floor.  Registration desk to the right of elevators on 6th floor  If you have any trouble locating the clinic, please don't hesitate to call [phone].    Cardiac Ablation, Care After  This sheet gives you information about how to care for yourself after your procedure. Your health care provider may also give you more specific instructions. If you have problems or questions, contact your health care provider. What can I expect after the procedure? After the procedure, it is common to have: Bruising around your puncture site. Tenderness around your puncture site. Skipped heartbeats. If you had an atrial fibrillation ablation, you may have atrial fibrillation during the first several months after your procedure.  Tiredness (fatigue).  Follow these instructions at home: Puncture site care  Follow instructions from your health care provider about how to take care of your puncture site. Make  sure you: If present, leave stitches (sutures), skin glue, or adhesive strips in place. These skin closures may need to stay in place for up to 2 weeks. If adhesive strip edges start to loosen and curl up, you may trim the loose edges. Do not remove adhesive strips completely unless your health care provider tells you to do that. If a large square bandage is present, this may be removed 24 hours after surgery.  Check your puncture site every day for signs of infection. Check for: Redness, swelling, or pain. Fluid or blood. If your puncture site starts to bleed, lie down on your back, apply firm pressure to the area, and contact your health care provider. Warmth. Pus or a bad smell. A pea or small marble sized lump at the site is normal and can take up to three months to resolve.  Driving Do not drive for at least 4 days after your procedure or however long your health care provider recommends. (Do not resume driving if you have previously been instructed not to drive for other health reasons.) Do not drive or use heavy machinery while taking prescription pain medicine. Activity Avoid activities that take a lot of effort for at least 7 days after your procedure. Do not lift anything that is heavier than 5 lb (4.5 kg) for one week.  No sexual activity for 1 week.  Return to your normal activities as told by your health care provider. Ask your health care provider what activities are safe for you. General instructions Take over-the-counter and prescription medicines only as told by your health care provider. Do  not use any products that contain nicotine or tobacco, such as cigarettes and e-cigarettes. If you need help quitting, ask your health care provider. You may shower after 24 hours, but Do not take baths, swim, or use a hot tub for 1 week.  Do not drink alcohol for 24 hours after your procedure. Keep all follow-up visits as told by your health care provider. This is important. Contact a  health care provider if: You have redness, mild swelling, or pain around your puncture site. You have fluid or blood coming from your puncture site that stops after applying firm pressure to the area. Your puncture site feels warm to the touch. You have pus or a bad smell coming from your puncture site. You have a fever. You have chest pain or discomfort that spreads to your neck, jaw, or arm. You have chest pain that is worse with lying on your back or taking a deep breath. You are sweating a lot. You feel nauseous. You have a fast or irregular heartbeat. You have shortness of breath. You are dizzy or light-headed and feel the need to lie down. You have pain or numbness in the arm or leg closest to your puncture site. Get help right away if: Your puncture site suddenly swells. Your puncture site is bleeding and the bleeding does not stop after applying firm pressure to the area. These symptoms may represent a serious problem that is an emergency. Do not wait to see if the symptoms will go away. Get medical help right away. Call your local emergency services (911 in the [place] Do not drive yourself to the hospital. Summary After the procedure, it is normal to have bruising and tenderness at the puncture site in your groin, neck, or forearm. Check your puncture site every day for signs of infection. Get help right away if your puncture site is bleeding and the bleeding does not stop after applying firm pressure to the area. This is a medical emergency. This information is not intended to replace advice given to you by your health care provider. Make sure you discuss any questions you have with your health care provider.

## 2023-01-04 NOTE — Anesthesia Procedure Notes (Signed)
Procedure Name: Intubation Date/Time: 01/04/2023 10:33 AM  Performed by: Margarita Rana, CRNAPre-anesthesia Checklist: Patient identified, Patient being monitored, Timeout performed, Emergency Drugs available and Suction available Patient Re-evaluated:Patient Re-evaluated prior to induction Oxygen Delivery Method: Circle System Utilized Preoxygenation: Pre-oxygenation with 100% oxygen Induction Type: IV induction Ventilation: Oral airway inserted - appropriate to patient size and Two handed mask ventilation required Laryngoscope Size: 4 and Glidescope Grade View: Grade I Tube type: Oral Tube size: 7.5 mm Number of attempts: 1 Airway Equipment and Method: Video-laryngoscopy Placement Confirmation: ETT inserted through vocal cords under direct vision, positive ETCO2 and breath sounds checked- equal and bilateral Secured at: 21 cm Tube secured with: Tape Dental Injury: Teeth and Oropharynx as per pre-operative assessment

## 2023-01-04 NOTE — Progress Notes (Signed)
  Echocardiogram Echocardiogram Transesophageal has been performed.  David Schwartz 01/04/2023, 11:06 AM

## 2023-01-04 NOTE — Anesthesia Preprocedure Evaluation (Signed)
Anesthesia Evaluation  Patient identified by MRN, date of birth, ID band Patient awake    Reviewed: Allergy & Precautions, H&P , NPO status , Patient's Chart, lab work & pertinent test results  Airway Mallampati: II  TM Distance: >3 FB Neck ROM: Full    Dental no notable dental hx.    Pulmonary neg pulmonary ROS   Pulmonary exam normal breath sounds clear to auscultation       Cardiovascular hypertension, Pt. on medications and Pt. on home beta blockers Normal cardiovascular exam+ dysrhythmias Atrial Fibrillation  Rhythm:Irregular Rate:Normal     Neuro/Psych negative neurological ROS  negative psych ROS   GI/Hepatic negative GI ROS, Neg liver ROS,,,  Endo/Other  negative endocrine ROS    Renal/GU negative Renal ROS  negative genitourinary   Musculoskeletal negative musculoskeletal ROS (+)    Abdominal   Peds negative pediatric ROS (+)  Hematology negative hematology ROS (+)   Anesthesia Other Findings   Reproductive/Obstetrics negative OB ROS                             Anesthesia Physical Anesthesia Plan  ASA: 3  Anesthesia Plan: General   Post-op Pain Management: Minimal or no pain anticipated   Induction: Intravenous  PONV Risk Score and Plan: 3 and Treatment may vary due to age or medical condition, Ondansetron and Dexamethasone  Airway Management Planned: Oral ETT  Additional Equipment:   Intra-op Plan:   Post-operative Plan: Extubation in OR  Informed Consent: I have reviewed the patients History and Physical, chart, labs and discussed the procedure including the risks, benefits and alternatives for the proposed anesthesia with the patient or authorized representative who has indicated his/her understanding and acceptance.     Dental advisory given  Plan Discussed with: CRNA and Surgeon  Anesthesia Plan Comments:        Anesthesia Quick Evaluation

## 2023-01-04 NOTE — CV Procedure (Signed)
    TRANSESOPHAGEAL ECHOCARDIOGRAM   NAME:  KALVEN GANIM   MRN: [id number] DOB:  Jul 20, 1946   ADMIT DATE: 01/04/2023  INDICATIONS:  AF, pre-ablation  PROCEDURE:   Informed consent was obtained prior to the procedure. The risks, benefits and alternatives for the procedure were discussed and the patient comprehended these risks.  Risks include, but are not limited to, cough, sore throat, vomiting, nausea, somnolence, esophageal and stomach trauma or perforation, bleeding, low blood pressure, aspiration, pneumonia, infection, trauma to the teeth and death.    The patient was already intubated and sedated on the EP table. After a procedural time-out, the transesophageal probe was inserted in the esophagus and stomach without difficulty and multiple views were obtained.    COMPLICATIONS:    There were no immediate complications.  FINDINGS:  LEFT VENTRICLE: EF = 45-50%. No regional wall motion abnormalities.  RIGHT VENTRICLE: Normal size and function.   LEFT ATRIUM: Markedly dilated  LEFT ATRIAL APPENDAGE: + smoke No thrombus.   RIGHT ATRIUM: Mildly dilated  AORTIC VALVE:  Trileaflet.  Trivial AI  MITRAL VALVE:    Normal. Mild MR  TRICUSPID VALVE: Normal. Mild TR  PULMONIC VALVE: Grossly normal. Mild PI  INTERATRIAL SEPTUM: No PFO or ASD.  PERICARDIUM: No effusion  DESCENDING AORTA: Moderate plaque    Ahava Kissoon,MD 10:56 AM

## 2023-01-05 ENCOUNTER — Encounter (HOSPITAL_COMMUNITY): Payer: Self-pay | Admitting: Cardiovascular Disease

## 2023-01-05 LAB — POCT ACTIVATED CLOTTING TIME
Activated Clotting Time: 298 seconds
Activated Clotting Time: 314 seconds

## 2023-01-17 ENCOUNTER — Other Ambulatory Visit: Payer: Self-pay | Admitting: Cardiology

## 2023-01-19 ENCOUNTER — Ambulatory Visit (INDEPENDENT_AMBULATORY_CARE_PROVIDER_SITE_OTHER): Payer: Medicare Other

## 2023-01-19 VITALS — Ht 69.0 in | Wt 175.0 lb

## 2023-01-19 DIAGNOSIS — Z0000 Encounter for general adult medical examination without abnormal findings: Secondary | ICD-10-CM | POA: Diagnosis not present

## 2023-01-19 NOTE — Progress Notes (Signed)
Subjective:   David Schwartz is a 77 y.o. male who presents for Medicare Annual/Subsequent preventive examination. I connected with  Memory Argue on 01/19/23 by a audio enabled telemedicine application and verified that I am speaking with the correct person using two identifiers.  Patient Location: Home  Provider Location: Home Office  I discussed the limitations of evaluation and management by telemedicine. The patient expressed understanding and agreed to proceed.  Review of Systems     Cardiac Risk Factors include: advanced age (>56men, >70 women);hypertension;male gender;dyslipidemia     Objective:    Today's Vitals   01/19/23 1513  Weight: 175 lb (79.4 kg)  Height: 5\' 9"  (1.753 m)   Body mass index is 25.84 kg/m.     01/19/2023    3:15 PM 01/04/2023    8:04 AM 06/14/2022    1:44 PM 01/05/2022    2:07 PM 06/23/2021    7:45 AM 06/21/2021    1:17 PM 06/07/2021    3:21 PM  Advanced Directives  Does Patient Have a Medical Advance Directive? Yes Yes Yes Yes No Yes Yes  Type of Estate agent of [place];Living will Healthcare Power of [place];Living will Healthcare Power of [place];Living will Healthcare Power of [place];Living will  Living will;Healthcare Power of Attorney Living will;Healthcare Power of Attorney  Does patient want to make changes to medical advance directive?  No - Patient declined No - Patient declined   No - Patient declined No - Patient declined  Copy of Healthcare Power of Attorney in Chart? No - copy requested No - copy requested  No - copy requested  No - copy requested   Would patient like information on creating a medical advance directive?     No - Patient declined      Current Medications (verified) Outpatient Encounter Medications as of 01/19/2023  Medication Sig   acetaminophen (TYLENOL) 500 MG tablet Take 500 mg by mouth every 6 (six) hours as needed for moderate pain.   alendronate (FOSAMAX) 70 MG tablet TAKE 1  TABLET BY MOUTH WEEKLY  WITH 8 OZ OF PLAIN WATER 30  MINUTES BEFORE FIRST FOOD, DRINK OR MEDS. STAY UPRIGHT FOR 30  MINS   calcium carbonate (OSCAL) 1500 (600 Ca) MG TABS tablet Take 600 mg of elemental calcium by mouth daily with breakfast.   Cholecalciferol (VITAMIN D3) 50 MCG (2000 UT) TABS Take 6,000 Units by mouth daily.   ELIQUIS 5 MG TABS tablet Take 1 tablet by mouth twice daily   ketoconazole (NIZORAL) 2 % cream Apply 1 Application topically daily as needed for irritation.   losartan (COZAAR) 25 MG tablet Take 1 tablet by mouth once daily   metoprolol succinate (TOPROL-XL) 50 MG 24 hr tablet Take 1.5 tablets (75 mg total) by mouth daily.   Multiple Vitamin (MULTIVITAMIN WITH MINERALS) TABS tablet Take 1 tablet by mouth daily.   pantoprazole (PROTONIX) 40 MG tablet Take 1 tablet (40 mg total) by mouth daily.   rosuvastatin (CRESTOR) 5 MG tablet Take 1 tablet by mouth daily   tadalafil (CIALIS) 20 MG tablet Take 1 tablet (20 mg total) by mouth as needed for erectile dysfunction.   terazosin (HYTRIN) 2 MG capsule TAKE 1 CAPSULE BY MOUTH AT  BEDTIME   triamcinolone cream (KENALOG) 0.1 % Apply 1 Application topically 2 (two) times daily.   colchicine 0.6 MG tablet Take 1 tablet (0.6 mg total) by mouth 2 (two) times daily for 5 days.   No facility-administered encounter medications  on file as of 01/19/2023.    Allergies (verified) Patient has no known allergies.   History: Past Medical History:  Diagnosis Date   Arthritis    Atrial fibrillation (HCC)    Cancer (HCC)    Complication of anesthesia    difficult waking up   Dysrhythmia    Hyperlipidemia    Hypertension    Past Surgical History:  Procedure Laterality Date   ATRIAL FIBRILLATION ABLATION N/A 01/04/2023   Procedure: ATRIAL FIBRILLATION ABLATION;  Surgeon: Maurice Small, MD;  Location: [facility] INVASIVE CV LAB;  Service: Cardiovascular;  Laterality: N/A;   CARDIOVERSION N/A 01/29/2020   Procedure: CARDIOVERSION;  Surgeon:  Jonelle Sidle, MD;  Location: [facility] ORS;  Service: Cardiovascular;  Laterality: N/A;   CARDIOVERSION N/A 06/16/2022   Procedure: CARDIOVERSION;  Surgeon: Christell Constant, MD;  Location: [facility] OR[facility];  Service: Cardiovascular;  Laterality: N/A;   GOLD SEED IMPLANT N/A 06/23/2021   Procedure: GOLD SEED IMPLANT;  Surgeon: Malen Gauze, MD;  Location: [facility] ORS;  Service: Urology;  Laterality: N/A;   HERNIA REPAIR     KNEE ARTHROSCOPY     SPACE OAR INSTILLATION N/A 06/23/2021   Procedure: SPACE OAR INSTILLATION;  Surgeon: Malen Gauze, MD;  Location: [facility] OR[facility];  Service: Urology;  Laterality: N/A;   TEE WITHOUT CARDIOVERSION N/A 01/04/2023   Procedure: TRANSESOPHAGEAL ECHOCARDIOGRAM;  Surgeon: Dolores Patty, MD;  Location: [facility] INVASIVE CV LAB;  Service: Cardiovascular;  Laterality: N/A;   Family History  Problem Relation Age of Onset   Diabetes Mother    Hypertension Mother    Early death Father    Arthritis Maternal Grandmother    Hypertension Maternal Grandfather    Hypertension Paternal Grandmother    Cancer Paternal Grandfather        lung   Social History   Socioeconomic History   Marital status: Married    Spouse name: Dois Davenport   Number of children: Not on file   Years of education: 12   Highest education level: 12th grade  Occupational History   Occupation: Retired  Tobacco Use   Smoking status: Never   Smokeless tobacco: Never  Vaping Use   Vaping Use: Never used  Substance and Sexual Activity   Alcohol use: Yes    Comment: occ   Drug use: Never   Sexual activity: Not on file  Other Topics Concern   Not on file  Social History Narrative   Living with wife - son lives nearby   Social Determinants of Health   Financial Resource Strain: Low Risk  (01/19/2023)   Overall Financial Resource Strain (CARDIA)    Difficulty of Paying Living Expenses: Not hard at all  Food Insecurity: No Food Insecurity (01/19/2023)   Hunger Vital Sign    Worried About  Running Out of Food in the Last Year: Never true    Ran Out of Food in the Last Year: Never true  Transportation Needs: No Transportation Needs (01/19/2023)   PRAPARE - Administrator, Civil Service (Medical): No    Lack of Transportation (Non-Medical): No  Physical Activity: Insufficiently Active (01/19/2023)   Exercise Vital Sign    Days of Exercise per Week: 3 days    Minutes of Exercise per Session: 30 min  Stress: No Stress Concern Present (01/19/2023)   [employer] of Occupational Health - Occupational Stress Questionnaire    Feeling of Stress : Not at all  Social Connections: Moderately Integrated (01/19/2023)   Social Connection  and Isolation Panel [NHANES]    Frequency of Communication with Friends and Family: More than three times a week    Frequency of Social Gatherings with Friends and Family: More than three times a week    Attends Religious Services: More than 4 times per year    Active Member of [employer] or Organizations: No    Attends Engineer, structural: Never    Marital Status: Married    Tobacco Counseling Counseling given: Not Answered   Clinical Intake:  Pre-visit preparation completed: Yes  Pain : No/denies pain     Nutritional Risks: None Diabetes: No  How often do you need to have someone help you when you read instructions, pamphlets, or other written materials from your doctor or pharmacy?: 1 - Never  Diabetic?no   Interpreter Needed?: No  Information entered by :: Renie Ora, LPN   Activities of Daily Living    01/19/2023    3:16 PM 06/14/2022    1:48 PM  In your present state of health, do you have any difficulty performing the following activities:  Hearing? 0   Vision? 0   Difficulty concentrating or making decisions? 0   Walking or climbing stairs? 0   Dressing or bathing? 0   Doing errands, shopping? 0 0  Preparing Food and eating ? N   Using the Toilet? N   In the past six months, have you accidently  leaked urine? N   Do you have problems with loss of bowel control? N   Managing your Medications? N   Managing your Finances? N   Housekeeping or managing your Housekeeping? N     Patient Care Team: Dettinger, Elige Radon, MD as PCP - General (Family Medicine) Wyline Mood, Dorothe Pea, MD as PCP - Cardiology (Cardiology) Danella Maiers, [facility] (Pharmacist) Marcelino Duster, MD as Referring Physician (Dermatology) Ronne Binning Mardene Celeste, MD as Consulting Physician (Urology) Wyline Mood Dorothe Pea, MD as Consulting Physician (Cardiology) Cherlyn Cushing, RN as Oncology Nurse Navigator  Indicate any recent Medical Services you may have received from other than [facility] providers in the past year (date may be approximate).     Assessment:   This is a routine wellness examination for Govani.  Hearing/Vision screen Vision Screening - Comments:: Wears rx glasses - [facility]  Dietary issues and exercise activities discussed: Current Exercise Habits: Home exercise routine, Type of exercise: walking, Time (Minutes): 30, Frequency (Times/Week): 3, Weekly Exercise (Minutes/Week): 90, Intensity: Mild, Exercise limited by: None identified   Goals Addressed             This Visit's Progress    DIET - INCREASE WATER INTAKE         Depression Screen    01/19/2023    3:14 PM 10/12/2022    9:27 AM 01/18/2022    3:58 PM 01/05/2022    2:06 PM 12/13/2021    9:09 AM 10/10/2021    9:15 AM 04/07/2021    9:30 AM  PHQ 2/9 Scores  PHQ - 2 Score 0 0 0 0 0 0 0  PHQ- 9 Score  0 0 0 0      Fall Risk    01/19/2023    3:13 PM 10/12/2022    9:27 AM 01/05/2022    2:03 PM 12/13/2021    9:10 AM 10/10/2021    9:15 AM  Fall Risk   Falls in the past year? 0 0 0 0 0  Number falls in  past yr: 0  0    Injury with Fall? 0  0    Risk for fall due to : No Fall Risks  Orthopedic patient    Follow up Falls prevention discussed  Falls prevention discussed      FALL RISK PREVENTION PERTAINING TO THE  HOME:  Any stairs in or around the home? Yes  If so, are there any without handrails? No  Home free of loose throw rugs in walkways, pet beds, electrical cords, etc? Yes  Adequate lighting in your home to reduce risk of falls? Yes   ASSISTIVE DEVICES UTILIZED TO PREVENT FALLS:  Life alert? No  Use of a cane, walker or w/c? No  Grab bars in the bathroom? Yes  Shower chair or bench in shower? Yes  Elevated toilet seat or a handicapped toilet? Yes       01/19/2023    3:16 PM 01/05/2022    2:30 PM 01/02/2020    2:01 PM  6CIT Screen  What Year? 0 points 0 points 0 points  What month? 0 points 0 points 0 points  What time? 0 points 0 points 0 points  Count back from 20 0 points 0 points 0 points  Months in reverse 0 points 0 points 0 points  Repeat phrase 0 points 2 points 4 points  Total Score 0 points 2 points 4 points    Immunizations Immunization History  Administered Date(s) Administered   Fluad Quad(high Dose 65+) 06/06/2019   Influenza, High Dose Seasonal PF 05/30/2018   Influenza-Unspecified 07/27/2020, 07/05/2021, 06/08/2022   Moderna Sars-Covid-2 Vaccination 10/18/2019, 11/15/2019, 10/29/2021   Pneumococcal Conjugate-13 04/08/2020   Pneumococcal Polysaccharide-23 08/10/2011   Pneumococcal-Unspecified 02/23/2014   Tdap 03/31/2016   Zoster Recombinat (Shingrix) 04/13/2020, 06/09/2020   Zoster, Live 06/10/2011    TDAP status: Up to date  Flu Vaccine status: Up to date  Pneumococcal vaccine status: Up to date  Covid-19 vaccine status: Completed vaccines  Qualifies for Shingles Vaccine? Yes   Zostavax completed Yes   Shingrix Completed?: Yes  Screening Tests Health Maintenance  Topic Date Due   COVID-19 Vaccine (4 - 2023-24 season) 05/05/2022   INFLUENZA VACCINE  04/05/2023   DEXA SCAN  04/09/2023   Medicare Annual Wellness (AWV)  01/19/2024   DTaP/Tdap/Td (2 - Td or Tdap) 03/31/2026   Pneumonia Vaccine 78+ Years old  Completed   Hepatitis C Screening   Completed   Zoster Vaccines- Shingrix  Completed   HPV VACCINES  Aged Out   COLONOSCOPY (Pts 45-64yrs Insurance coverage will need to be confirmed)  Discontinued    Health Maintenance  Health Maintenance Due  Topic Date Due   COVID-19 Vaccine (4 - 2023-24 season) 05/05/2022    Colorectal cancer screening: No longer required.   Lung Cancer Screening: (Low Dose CT Chest recommended if Age 37-80 years, 30 pack-year currently smoking OR have quit w/in 15years.) does not qualify.   Lung Cancer Screening Referral: n/a  Additional Screening:  Hepatitis C Screening: does not qualify; Completed 06/26/2019  Vision Screening: Recommended annual ophthalmology exams for early detection of glaucoma and other disorders of the eye. Is the patient up to date with their annual eye exam?  Yes  Who is the provider or what is the [facility] of the [facility] in which the patient [facility]  If pt is not established with a provider, would they like to be referred to a provider to establish care? No .   Dental Screening: Recommended annual  dental exams for proper oral hygiene  Community Resource Referral / Chronic Care Management: CRR required this visit?  No   CCM required this visit?  No      Plan:     I have personally reviewed and noted the following in the patient's chart:   Medical and social history Use of alcohol, tobacco or illicit drugs  Current medications and supplements including opioid prescriptions. Patient is not currently taking opioid prescriptions. Functional ability and status Nutritional status Physical activity Advanced directives List of other physicians Hospitalizations, surgeries, and ER visits in previous 12 months Vitals Screenings to include cognitive, depression, and falls Referrals and appointments  In addition, I have reviewed and discussed with patient certain preventive protocols, quality metrics, and best practice recommendations. A written  personalized care plan for preventive services as well as general preventive health recommendations were provided to patient.     Lorrene Reid, LPN   1/61/0960   Nurse Notes: none

## 2023-01-19 NOTE — Patient Instructions (Signed)
David Schwartz , Thank you for taking time to come for your Medicare Wellness Visit. I appreciate your ongoing commitment to your health goals. Please review the following plan we discussed and let me know if I can assist you in the future.   These are the goals we discussed:  Goals      DIET - INCREASE WATER INTAKE     Exercise 3x per week (30 min per time)     01/05/2022 AWV Goal: Exercise for General Health  Patient will verbalize understanding of the benefits of increased physical activity: Exercising regularly is important. It will improve your overall fitness, flexibility, and endurance. Regular exercise also will improve your overall health. It can help you control your weight, reduce stress, and improve your bone density. Over the next year, patient will increase physical activity as tolerated with a goal of at least 150 minutes of moderate physical activity per week.  You can tell that you are exercising at a moderate intensity if your heart starts beating faster and you start breathing faster but can still hold a conversation. Moderate-intensity exercise ideas include: Walking 1 mile (1.6 km) in about 15 minutes Biking Hiking Golfing Dancing Water aerobics Patient will verbalize understanding of everyday activities that increase physical activity by providing examples like the following: Yard work, such as: Insurance underwriter Gardening Washing windows or floors Patient will be able to explain general safety guidelines for exercising:  Before you start a new exercise program, talk with your health care provider. Do not exercise so much that you hurt yourself, feel dizzy, or get very short of breath. Wear comfortable clothes and wear shoes with good support. Drink plenty of water while you exercise to prevent dehydration or heat stroke. Work out until your breathing and your heartbeat get faster.          This is a list of the screening recommended for you and due dates:  Health Maintenance  Topic Date Due   COVID-19 Vaccine (4 - 2023-24 season) 05/05/2022   Flu Shot  04/05/2023   DEXA scan (bone density measurement)  04/09/2023   Medicare Annual Wellness Visit  01/19/2024   DTaP/Tdap/Td vaccine (2 - Td or Tdap) 03/31/2026   Pneumonia Vaccine  Completed   Hepatitis C Screening: USPSTF Recommendation to screen - Ages 19-79 yo.  Completed   Zoster (Shingles) Vaccine  Completed   HPV Vaccine  Aged Out   Colon Cancer Screening  Discontinued    Advanced directives: Advance directive discussed with you today. I have provided a copy for you to complete at home and have notarized. Once this is complete please bring a copy in to our office so we can scan it into your chart.   Conditions/risks identified: Aim for 30 minutes of exercise or brisk walking, 6-8 glasses of water, and 5 servings of fruits and vegetables each day.   Next appointment: Follow up in one year for your annual wellness visit.   Preventive Care 45 Years and Older, Male  Preventive care refers to lifestyle choices and visits with your health care provider that can promote health and wellness. What does preventive care include? A yearly physical exam. This is also called an annual well check. Dental exams once or twice a year. Routine eye exams. Ask your health care provider how often you should have your eyes checked. Personal lifestyle choices, including: Daily care of your teeth and gums.  Regular physical activity. Eating a healthy diet. Avoiding tobacco and drug use. Limiting alcohol use. Practicing safe sex. Taking low doses of aspirin every day. Taking vitamin and mineral supplements as recommended by your health care provider. What happens during an annual well check? The services and screenings done by your health care provider during your annual well check will depend on your age, overall health,  lifestyle risk factors, and family history of disease. Counseling  Your health care provider may ask you questions about your: Alcohol use. Tobacco use. Drug use. Emotional well-being. Home and relationship well-being. Sexual activity. Eating habits. History of falls. Memory and ability to understand (cognition). Work and work Astronomer. Screening  You may have the following tests or measurements: Height, weight, and BMI. Blood pressure. Lipid and cholesterol levels. These may be checked every 5 years, or more frequently if you are over 35 years old. Skin check. Lung cancer screening. You may have this screening every year starting at age 31 if you have a 30-pack-year history of smoking and currently smoke or have quit within the past 15 years. Fecal occult blood test (FOBT) of the stool. You may have this test every year starting at age 54. Flexible sigmoidoscopy or colonoscopy. You may have a sigmoidoscopy every 5 years or a colonoscopy every 10 years starting at age 5. Prostate cancer screening. Recommendations will vary depending on your family history and other risks. Hepatitis C blood test. Hepatitis B blood test. Sexually transmitted disease (STD) testing. Diabetes screening. This is done by checking your blood sugar (glucose) after you have not eaten for a while (fasting). You may have this done every 1-3 years. Abdominal aortic aneurysm (AAA) screening. You may need this if you are a current or former smoker. Osteoporosis. You may be screened starting at age 19 if you are at high risk. Talk with your health care provider about your test results, treatment options, and if necessary, the need for more tests. Vaccines  Your health care provider may recommend certain vaccines, such as: Influenza vaccine. This is recommended every year. Tetanus, diphtheria, and acellular pertussis (Tdap, Td) vaccine. You may need a Td booster every 10 years. Zoster vaccine. You may need this  after age 4. Pneumococcal 13-valent conjugate (PCV13) vaccine. One dose is recommended after age 20. Pneumococcal polysaccharide (PPSV23) vaccine. One dose is recommended after age 28. Talk to your health care provider about which screenings and vaccines you need and how often you need them. This information is not intended to replace advice given to you by your health care provider. Make sure you discuss any questions you have with your health care provider. Document Released: 09/17/2015 Document Revised: 05/10/2016 Document Reviewed: 06/22/2015 Elsevier Interactive Patient Education  2017 [employer].  Fall Prevention in the Home Falls can cause injuries. They can happen to people of all ages. There are many things you can do to make your home safe and to help prevent falls. What can I do on the outside of my home? Regularly fix the edges of walkways and driveways and fix any cracks. Remove anything that might make you trip as you walk through a door, such as a raised step or threshold. Trim any bushes or trees on the path to your home. Use bright outdoor lighting. Clear any walking paths of anything that might make someone trip, such as rocks or tools. Regularly check to see if handrails are loose or broken. Make sure that both sides of any steps have handrails. Any raised  decks and porches should have guardrails on the edges. Have any leaves, snow, or ice cleared regularly. Use sand or salt on walking paths during winter. Clean up any spills in your garage right away. This includes oil or grease spills. What can I do in the bathroom? Use night lights. Install grab bars by the toilet and in the tub and shower. Do not use towel bars as grab bars. Use non-skid mats or decals in the tub or shower. If you need to sit down in the shower, use a plastic, non-slip stool. Keep the floor dry. Clean up any water that spills on the floor as soon as it happens. Remove soap buildup in the tub or  shower regularly. Attach bath mats securely with double-sided non-slip rug tape. Do not have throw rugs and other things on the floor that can make you trip. What can I do in the bedroom? Use night lights. Make sure that you have a light by your bed that is easy to reach. Do not use any sheets or blankets that are too big for your bed. They should not hang down onto the floor. Have a firm chair that has side arms. You can use this for support while you get dressed. Do not have throw rugs and other things on the floor that can make you trip. What can I do in the kitchen? Clean up any spills right away. Avoid walking on wet floors. Keep items that you use a lot in easy-to-reach places. If you need to reach something above you, use a strong step stool that has a grab bar. Keep electrical cords out of the way. Do not use floor polish or wax that makes floors slippery. If you must use wax, use non-skid floor wax. Do not have throw rugs and other things on the floor that can make you trip. What can I do with my stairs? Do not leave any items on the stairs. Make sure that there are handrails on both sides of the stairs and use them. Fix handrails that are broken or loose. Make sure that handrails are as long as the stairways. Check any carpeting to make sure that it is firmly attached to the stairs. Fix any carpet that is loose or worn. Avoid having throw rugs at the top or bottom of the stairs. If you do have throw rugs, attach them to the floor with carpet tape. Make sure that you have a light switch at the top of the stairs and the bottom of the stairs. If you do not have them, ask someone to add them for you. What else can I do to help prevent falls? Wear shoes that: Do not have high heels. Have rubber bottoms. Are comfortable and fit you well. Are closed at the toe. Do not wear sandals. If you use a stepladder: Make sure that it is fully opened. Do not climb a closed stepladder. Make  sure that both sides of the stepladder are locked into place. Ask someone to hold it for you, if possible. Clearly mark and make sure that you can see: Any grab bars or handrails. First and last steps. Where the edge of each step is. Use tools that help you move around (mobility aids) if they are needed. These include: Canes. Walkers. Scooters. Crutches. Turn on the lights when you go into a dark area. Replace any light bulbs as soon as they burn out. Set up your furniture so you have a clear path. Avoid moving your furniture  around. If any of your floors are uneven, fix them. If there are any pets around you, be aware of where they are. Review your medicines with your doctor. Some medicines can make you feel dizzy. This can increase your chance of falling. Ask your doctor what other things that you can do to help prevent falls. This information is not intended to replace advice given to you by your health care provider. Make sure you discuss any questions you have with your health care provider. Document Released: 06/17/2009 Document Revised: 01/27/2016 Document Reviewed: 09/25/2014 Elsevier Interactive Patient Education  2017 [employer].

## 2023-01-29 LAB — ECHO TEE

## 2023-02-01 ENCOUNTER — Ambulatory Visit (HOSPITAL_COMMUNITY)
Admission: RE | Admit: 2023-02-01 | Discharge: 2023-02-01 | Disposition: A | Discharge status: Home / Self Care | Payer: Medicare Other | Source: Ambulatory Visit | Attending: Physician Assistant | Admitting: Physician Assistant

## 2023-02-01 ENCOUNTER — Encounter (HOSPITAL_COMMUNITY): Payer: Self-pay | Admitting: Physician Assistant

## 2023-02-01 VITALS — BP 130/78 | HR 67 | Ht 69.0 in | Wt 179.0 lb

## 2023-02-01 DIAGNOSIS — I2510 Atherosclerotic heart disease of native coronary artery without angina pectoris: Secondary | ICD-10-CM | POA: Diagnosis not present

## 2023-02-01 DIAGNOSIS — Z7901 Long term (current) use of anticoagulants: Secondary | ICD-10-CM | POA: Insufficient documentation

## 2023-02-01 DIAGNOSIS — D6869 Other thrombophilia: Secondary | ICD-10-CM | POA: Diagnosis not present

## 2023-02-01 DIAGNOSIS — I4819 Other persistent atrial fibrillation: Secondary | ICD-10-CM | POA: Insufficient documentation

## 2023-02-01 DIAGNOSIS — I5020 Unspecified systolic (congestive) heart failure: Secondary | ICD-10-CM | POA: Diagnosis not present

## 2023-02-01 DIAGNOSIS — I428 Other cardiomyopathies: Secondary | ICD-10-CM | POA: Insufficient documentation

## 2023-02-01 DIAGNOSIS — I110 Hypertensive heart disease with heart failure: Secondary | ICD-10-CM | POA: Insufficient documentation

## 2023-02-01 NOTE — Progress Notes (Signed)
Primary Care Physician: Dettinger, Elige Radon, MD Primary Cardiologist: Dr Dina Rich Primary Electrophysiologist: Dr Nelly Laurence Referring Physician: Sharlene Dory NP   David Schwartz is a 77 y.o. male with a history of HFrEF, HLD, CAD, prostate cancer, atrial fibrillation who presents for follow up in the [facility]. First diagnosed with A-fib by primary care provider in 2021, heart rate was 108 at that time.  Given CHA2DS2-VASc score was 2 at that time was started on Eliquis and metoprolol 25 mg.  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  Updated echocardiogram 3 months later revealed improvement in EF to 60 to 65%.  Noted recurrence of A-fib in March 2023, but did not report symptoms, rate controlled at that time. He began to feel more fatigued and was set up for repeat DCCV on 06/16/22. He was back in afib on follow up 08/08/22 and his BB was increased.   Patient was seen by Dr Nelly Laurence and underwent afib ablation 01/04/23.  On follow up today, patient reports that he has done well since his ablation. He states that he "feels like my old self". He has more energy in SR. He denies chest pain, swallowing pain, or groin issues.   Today, he denies symptoms of palpitations, chest pain, shortness of breath, orthopnea, PND, lower extremity edema, dizziness, presyncope, syncope, snoring, daytime somnolence, bleeding, or neurologic sequela. The patient is tolerating medications without difficulties and is otherwise without complaint today.    Atrial Fibrillation Risk Factors:  he does not have symptoms or diagnosis of sleep apnea. he does not have a history of rheumatic fever.   he has a BMI of Body mass index is 26.43 kg/m.Marland Kitchen Filed Weights   02/01/23 1100  Weight: 81.2 kg    Family History  Problem Relation Age of  Onset   Diabetes Mother    Hypertension Mother    Early death Father    Arthritis Maternal Grandmother    Hypertension Maternal Grandfather    Hypertension Paternal Grandmother    Cancer Paternal Grandfather        lung     Atrial Fibrillation Management history:  Previous antiarrhythmic drugs: none Previous cardioversions: 2021, 08/08/22 Previous ablations: 01/04/23 Anticoagulation history: Eliquis   Past Medical History:  Diagnosis Date   Arthritis    Atrial fibrillation (HCC)    Cancer (HCC)    Complication of anesthesia    difficult waking up   Dysrhythmia    Hyperlipidemia    Hypertension    Past Surgical History:  Procedure Laterality Date   ATRIAL FIBRILLATION ABLATION N/A 01/04/2023   Procedure: ATRIAL FIBRILLATION ABLATION;  Surgeon: Maurice Small, MD;  Location: [facility] CV [facility];  Service: Cardiovascular;  Laterality: N/A;   CARDIOVERSION N/A 01/29/2020   Procedure: CARDIOVERSION;  Surgeon: Jonelle Sidle, MD;  Location: [facility] OR[facility];  Service: Cardiovascular;  Laterality: N/A;   CARDIOVERSION N/A 06/16/2022   Procedure: CARDIOVERSION;  Surgeon: Christell Constant, MD;  Location: [facility];  Service: Cardiovascular;  Laterality: N/A;   GOLD SEED IMPLANT N/A 06/23/2021   Procedure: GOLD SEED IMPLANT;  Surgeon: Malen Gauze, MD;  Location: [facility];  Service: Urology;  Laterality: N/A;   HERNIA REPAIR     KNEE ARTHROSCOPY     SPACE OAR INSTILLATION N/A 06/23/2021   Procedure: SPACE OAR INSTILLATION;  Surgeon: Malen Gauze,  MD;  Location: [facility];  Service: Urology;  Laterality: N/A;   TEE WITHOUT CARDIOVERSION N/A 01/04/2023   Procedure: TRANSESOPHAGEAL ECHOCARDIOGRAM;  Surgeon: Dolores Patty, MD;  Location: [facility] INVASIVE CV LAB;  Service: Cardiovascular;  Laterality: N/A;    Current Outpatient Medications  Medication Sig Dispense Refill   alendronate (FOSAMAX) 70 MG tablet TAKE 1 TABLET BY MOUTH WEEKLY  WITH 8 OZ OF PLAIN WATER 30  MINUTES  BEFORE FIRST FOOD, DRINK OR MEDS. STAY UPRIGHT FOR 30  MINS 12 tablet 1   calcium carbonate (OSCAL) 1500 (600 Ca) MG TABS tablet Take 600 mg of elemental calcium by mouth daily with breakfast.     Cholecalciferol (VITAMIN D3) 50 MCG (2000 UT) TABS Take 6,000 Units by mouth daily.     ELIQUIS 5 MG TABS tablet Take 1 tablet by mouth twice daily 60 tablet 6   losartan (COZAAR) 25 MG tablet Take 1 tablet by mouth once daily 90 tablet 0   metoprolol succinate (TOPROL-XL) 50 MG 24 hr tablet Take 1.5 tablets (75 mg total) by mouth daily. 45 tablet 6   Misc Natural Products (OSTEO BI-FLEX JOINT SHIELD PO) Take 2 tablets by mouth daily.     Multiple Vitamin (MULTIVITAMIN WITH MINERALS) TABS tablet Take 1 tablet by mouth daily.     pantoprazole (PROTONIX) 40 MG tablet Take 1 tablet (40 mg total) by mouth daily. 30 tablet 0   rosuvastatin (CRESTOR) 5 MG tablet Take 1 tablet by mouth daily 90 tablet 3   terazosin (HYTRIN) 2 MG capsule TAKE 1 CAPSULE BY MOUTH AT  BEDTIME 90 capsule 3   acetaminophen (TYLENOL) 500 MG tablet Take 500 mg by mouth every 6 (six) hours as needed for moderate pain.     ketoconazole (NIZORAL) 2 % cream Apply 1 Application topically daily as needed for irritation.     tadalafil (CIALIS) 20 MG tablet Take 1 tablet (20 mg total) by mouth as needed for erectile dysfunction. 20 tablet 5   triamcinolone cream (KENALOG) 0.1 % Apply 1 Application topically 2 (two) times daily. 80 g 3   No current facility-administered medications for this encounter.    No Known Allergies  Social History   Socioeconomic History   Marital status: Married    Spouse name: Dois Davenport   Number of children: Not on file   Years of education: 12   Highest education level: 12th grade  Occupational History   Occupation: Retired  Tobacco Use   Smoking status: Never   Smokeless tobacco: Never  Vaping Use   Vaping Use: Never used  Substance and Sexual Activity   Alcohol use: Yes    Comment: occ   Drug use:  Never   Sexual activity: Not on file  Other Topics Concern   Not on file  Social History Narrative   Living with wife - son lives nearby   Social Determinants of Health   Financial Resource Strain: Low Risk  (01/19/2023)   Overall Financial Resource Strain (CARDIA)    Difficulty of Paying Living Expenses: Not hard at all  Food Insecurity: No Food Insecurity (01/19/2023)   Hunger Vital Sign    Worried About Running Out of Food in the Last Year: Never true    Ran Out of Food in the Last Year: Never true  Transportation Needs: No Transportation Needs (01/19/2023)   PRAPARE - Administrator, Civil Service (Medical): No    Lack of Transportation (Non-Medical): No  Physical Activity: Insufficiently Active (01/19/2023)  Exercise Vital Sign    Days of Exercise per Week: 3 days    Minutes of Exercise per Session: 30 min  Stress: No Stress Concern Present (01/19/2023)   [employer] of Occupational Health - Occupational Stress Questionnaire    Feeling of Stress : Not at all  Social Connections: Moderately Integrated (01/19/2023)   Social Connection and Isolation Panel [NHANES]    Frequency of Communication with Friends and Family: More than three times a week    Frequency of Social Gatherings with Friends and Family: More than three times a week    Attends Religious Services: More than 4 times per year    Active Member of [employer] or Organizations: No    Attends Banker Meetings: Never    Marital Status: Married  Catering manager Violence: Not At Risk (01/19/2023)   Humiliation, Afraid, Rape, and Kick questionnaire    Fear of Current or Ex-Partner: No    Emotionally Abused: No    Physically Abused: No    Sexually Abused: No     ROS- All systems are reviewed and negative except as per the HPI above.  Physical Exam: Vitals:   02/01/23 1100  BP: 130/78  Pulse: 67  Weight: 81.2 kg  Height: 5\' 9"  (1.753 m)    GEN- The patient is a well appearing elderly  male, alert and oriented x 3 today.   HEENT-head normocephalic, atraumatic, sclera clear, conjunctiva pink, hearing intact, trachea midline. Lungs- Clear to ausculation bilaterally, normal work of breathing Heart- Regular rate and rhythm, no murmurs, rubs or gallops  GI- soft, NT, ND, + BS Extremities- no clubbing, cyanosis, or edema MS- no significant deformity or atrophy Skin- no rash or lesion Psych- euthymic mood, full affect Neuro- strength and sensation are intact   Wt Readings from Last 3 Encounters:  02/01/23 81.2 kg  01/19/23 79.4 kg  01/04/23 78 kg    EKG today demonstrates  SR Vent. rate 67 BPM PR interval 178 ms QRS duration 80 ms QT/QTcB 400/422 ms  Echo 08/21/22 demonstrated  1. Left ventricular ejection fraction, by estimation, is 50 to 55%. The  left ventricle has low normal function. Left ventricular endocardial  border not optimally defined to evaluate regional wall motion. There is  mild left ventricular hypertrophy. Left ventricular diastolic parameters are indeterminate.   2. Right ventricular systolic function is normal. The right ventricular  size is mildly enlarged.   3. Left atrial size was moderately dilated.   4. The mitral valve is abnormal. Mild mitral valve regurgitation. No  evidence of mitral stenosis.   5. The tricuspid valve is abnormal.   6. The aortic valve was not well visualized. Aortic valve regurgitation  is not visualized. No aortic stenosis is present.   7. The inferior vena cava is normal in size with greater than 50%  respiratory variability, suggesting right atrial pressure of 3 mmHg.   Comparison(s): Echocardiogram done 04/22/20 showed an EF of 60-65%.   Epic records are reviewed at length today  CHA2DS2-VASc Score = 4  The patient's score is based upon: CHF History: 1 HTN History: 0 Diabetes History: 0 Stroke History: 0 Vascular Disease History: 1 Age Score: 2 Gender Score: 0       ASSESSMENT AND PLAN: 1.  Persistent Atrial Fibrillation (ICD10:  I48.19) The patient's CHA2DS2-VASc score is 4, indicating a 4.8% annual risk of stroke.   S/p afib ablation 01/04/23 Patient appears to be maintaining SR.  Continue Eliquis 5 mg BID with  no missed doses for 3 months post ablation.  Continue Toprol 75 mg daily  2. Secondary Hypercoagulable State (ICD10:  D68.69) The patient is at significant risk for stroke/thromboembolism based upon his CHA2DS2-VASc Score of 4.  Continue Apixaban (Eliquis).   3. NICM EF 35-40%, normalized post DCCV. Fluid status appears stable today.  4. CAD CAC score 1349 on CT On statin No anginal symptoms.   Follow up with Dr Nelly Laurence as scheduled.    Jorja Loa PA-C [facility] [address] [place], [place] [address] [phone] 02/01/2023 11:57 AM

## 2023-03-13 ENCOUNTER — Other Ambulatory Visit: Payer: Self-pay | Admitting: Nurse Practitioner

## 2023-03-13 DIAGNOSIS — R000 Tachycardia, unspecified: Secondary | ICD-10-CM

## 2023-03-15 ENCOUNTER — Ambulatory Visit (INDEPENDENT_AMBULATORY_CARE_PROVIDER_SITE_OTHER): Payer: Medicare Other | Admitting: Nurse Practitioner

## 2023-03-15 ENCOUNTER — Encounter: Payer: Self-pay | Admitting: Nurse Practitioner

## 2023-03-15 VITALS — BP 104/61 | HR 78 | Temp 98.0°F | Ht 69.0 in | Wt 177.6 lb

## 2023-03-15 DIAGNOSIS — S90562A Insect bite (nonvenomous), left ankle, initial encounter: Secondary | ICD-10-CM | POA: Diagnosis not present

## 2023-03-15 DIAGNOSIS — W57XXXA Bitten or stung by nonvenomous insect and other nonvenomous arthropods, initial encounter: Secondary | ICD-10-CM | POA: Diagnosis not present

## 2023-03-15 DIAGNOSIS — L519 Erythema multiforme, unspecified: Secondary | ICD-10-CM | POA: Diagnosis not present

## 2023-03-15 MED ORDER — HYDROCORTISONE 1 % EX LOTN
1.0000 | TOPICAL_LOTION | Freq: Two times a day (BID) | CUTANEOUS | 0 refills | Status: AC
Start: 2023-03-15 — End: ?

## 2023-03-15 NOTE — Progress Notes (Signed)
Established Patient Office Visit  Subjective   Patient ID: David Schwartz, male    DOB: 1946-01-12  Age: 77 y.o. MRN: [id number]  Chief Complaint  Patient presents with   Rash    Started yesterday has red spot on leg    HPI David Schwartz is a 77 y.o. male who complains rash on left ankle rash from 2 days. He denies any trauma, and currently on Eliquis.   Rash on left leg, new problem: He is he is unclear whether or not he was bitten by tick as he reported that he was in his shed  working . The site has been red and slightly swollen with mild itching but no severe pain.  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) 08/23/2022   Erythema multiforme, unspecified 03/15/2023   Nonvenomous insect bite of left ankle without infection 03/15/2023   Resolved Ambulatory Problems    Diagnosis Date Noted   No Resolved Ambulatory Problems   Past Medical History:  Diagnosis Date   Arthritis    Atrial fibrillation (HCC)    Cancer (HCC)    Complication of anesthesia    Dysrhythmia    Hyperlipidemia        Patient Active Problem List   Diagnosis Date Noted   Erythema multiforme, unspecified 03/15/2023   Nonvenomous insect bite of left ankle without infection 03/15/2023   Hypercoagulable state due to persistent atrial fibrillation (HCC) 08/23/2022   Malignant neoplasm of prostate (HCC) 05/04/2021   Persistent atrial fibrillation (HCC)    Hypertension 05/30/2018   Hyperlipemia 05/30/2018   Osteoporosis 05/30/2018   PMR (polymyalgia rheumatica) (HCC) 05/30/2018   Elevated  PSA measurement 05/30/2018   Past Medical History:  Diagnosis Date   Arthritis    Atrial fibrillation (HCC)    Cancer (HCC)    Complication of anesthesia    difficult waking up   Dysrhythmia    Hyperlipidemia    Hypertension    Past Surgical History:  Procedure Laterality Date   ATRIAL FIBRILLATION ABLATION N/A 01/04/2023   Procedure: ATRIAL FIBRILLATION ABLATION;  Surgeon: Maurice Small, MD;  Location: [facility];  Service: Cardiovascular;  Laterality: N/A;   CARDIOVERSION N/A 01/29/2020   Procedure: CARDIOVERSION;  Surgeon: Jonelle Sidle, MD;  Location: [facility];  Service: Cardiovascular;  Laterality: N/A;   CARDIOVERSION N/A 06/16/2022   Procedure: CARDIOVERSION;  Surgeon: Christell Constant, MD;  Location: [facility];  Service: Cardiovascular;  Laterality: N/A;   GOLD SEED IMPLANT N/A 06/23/2021   Procedure: GOLD SEED IMPLANT;  Surgeon: Malen Gauze, MD;  Location: [facility];  Service: Urology;  Laterality: N/A;   HERNIA REPAIR     KNEE ARTHROSCOPY     SPACE OAR INSTILLATION N/A 06/23/2021   Procedure: SPACE OAR INSTILLATION;  Surgeon: Malen Gauze, MD;  Location: [facility];  Service: Urology;  Laterality: N/A;   TEE WITHOUT CARDIOVERSION N/A 01/04/2023   Procedure: TRANSESOPHAGEAL ECHOCARDIOGRAM;  Surgeon: Dolores Patty, MD;  Location: [facility] INVASIVE CV LAB;  Service: Cardiovascular;  Laterality: N/A;   Social History   Tobacco Use  Smoking status: Never   Smokeless tobacco: Never  Vaping Use   Vaping status: Never Used  Substance Use Topics   Alcohol use: Yes    Comment: occ   Drug use: Never   Social History   Socioeconomic History   Marital status: Married    Spouse name: Dois Davenport   Number of children: Not on file   Years of education: 12   Highest education level: 12th grade  Occupational History   Occupation: Retired  Tobacco Use   Smoking status: Never   Smokeless tobacco: Never  Vaping Use   Vaping status: Never Used   Substance and Sexual Activity   Alcohol use: Yes    Comment: occ   Drug use: Never   Sexual activity: Not on file  Other Topics Concern   Not on file  Social History Narrative   Living with wife - son lives nearby   Social Determinants of Health   Financial Resource Strain: Low Risk  (01/19/2023)   Overall Financial Resource Strain (CARDIA)    Difficulty of Paying Living Expenses: Not hard at all  Food Insecurity: No Food Insecurity (01/19/2023)   Hunger Vital Sign    Worried About Running Out of Food in the Last Year: Never true    Ran Out of Food in the Last Year: Never true  Transportation Needs: No Transportation Needs (01/19/2023)   PRAPARE - Administrator, Civil Service (Medical): No    Lack of Transportation (Non-Medical): No  Physical Activity: Insufficiently Active (01/19/2023)   Exercise Vital Sign    Days of Exercise per Week: 3 days    Minutes of Exercise per Session: 30 min  Stress: No Stress Concern Present (01/19/2023)   [employer] of Occupational Health - Occupational Stress Questionnaire    Feeling of Stress : Not at all  Social Connections: Moderately Integrated (01/19/2023)   Social Connection and Isolation Panel [NHANES]    Frequency of Communication with Friends and Family: More than three times a week    Frequency of Social Gatherings with Friends and Family: More than three times a week    Attends Religious Services: More than 4 times per year    Active Member of [employer] or Organizations: No    Attends Banker Meetings: Never    Marital Status: Married  Catering manager Violence: Not At Risk (01/19/2023)   Humiliation, Afraid, Rape, and Kick questionnaire    Fear of Current or Ex-Partner: No    Emotionally Abused: No    Physically Abused: No    Sexually Abused: No   Family Status  Relation Name Status   Mother  Deceased   Father  Deceased   MGM  Deceased   MGF  Deceased   PGM  Deceased   PGF  Deceased  No partnership  data on file   Family History  Problem Relation Age of Onset   Diabetes Mother    Hypertension Mother    Early death Father    Arthritis Maternal Grandmother    Hypertension Maternal Grandfather    Hypertension Paternal Grandmother    Cancer Paternal Grandfather        lung   No Known Allergies    ROS Negative unless indicated in HPI   Objective:     BP 104/61   Pulse 78   Temp 98 F (36.7 C) (Temporal)   Ht 5\' 9"  (1.753 m)   Wt 177 lb 9.6 oz (80.6 kg)   SpO2 95%  BMI 26.23 kg/m  BP Readings from Last 3 Encounters:  03/15/23 104/61  02/01/23 130/78  01/04/23 90/73   Wt Readings from Last 3 Encounters:  03/15/23 177 lb 9.6 oz (80.6 kg)  02/01/23 179 lb (81.2 kg)  01/19/23 175 lb (79.4 kg)      Physical Exam Vitals and nursing note reviewed.  Constitutional:      Appearance: Normal appearance. He is normal weight.  HENT:     Head: Normocephalic and atraumatic.  Eyes:     General: No scleral icterus.    Extraocular Movements: Extraocular movements intact.     Pupils: Pupils are equal, round, and reactive to light.  Cardiovascular:     Rate and Rhythm: Normal rate.     Heart sounds: Normal heart sounds.  Pulmonary:     Effort: Pulmonary effort is normal. No respiratory distress.     Breath sounds: Normal breath sounds.  Musculoskeletal:     Cervical back: Normal range of motion and neck supple. No rigidity or tenderness.     Right lower leg: No edema.     Left lower leg: No edema.  Lymphadenopathy:     Comments: No regional lyphadenopathy  Skin:    General: Skin is warm and dry.     Findings: Erythema and rash present.          Comments: 5 cm erythematous are on the  left lower leg with a central punctum , surrounding the are is bullseye- shaped rash with concentric ring. No spreading erythema induration, or signs of secondary infection. No rash elsewhere  Neurological:     General: No focal deficit present.     Mental Status: He is alert and  oriented to person, place, and time. Mental status is at baseline.       No results found for any visits on 03/15/23.  Last CBC Lab Results  Component Value Date   WBC 6.0 12/20/2022   HGB 14.6 12/20/2022   HCT 42.0 12/20/2022   MCV 93 12/20/2022   MCH 32.4 12/20/2022   RDW 13.2 12/20/2022   PLT 194 12/20/2022   Last metabolic panel Lab Results  Component Value Date   GLUCOSE 80 12/20/2022   NA 141 12/20/2022   K 4.5 12/20/2022   CL 103 12/20/2022   CO2 22 12/20/2022   BUN 17 12/20/2022   CREATININE 0.77 12/20/2022   EGFR 92 12/20/2022   CALCIUM 9.6 12/20/2022   PROT 6.5 10/12/2022   ALBUMIN 4.6 10/12/2022   LABGLOB 1.9 10/12/2022   AGRATIO 2.4 (H) 10/12/2022   BILITOT 0.6 10/12/2022   ALKPHOS 53 10/12/2022   AST 20 10/12/2022   ALT 11 10/12/2022   ANIONGAP 11 01/27/2020   Last lipids Lab Results  Component Value Date   CHOL 167 10/12/2022   HDL 52 10/12/2022   LDLCALC 95 10/12/2022   TRIG 114 10/12/2022   CHOLHDL 3.2 10/12/2022        Assessment & Plan:  Erythema multiforme, unspecified -     Alpha-Gal Panel -     Lyme Disease Serology w/Reflex -     Hydrocortisone; Apply 1 Application topically 2 (two) times daily.  Dispense: 118 mL; Refill: 0  Nonvenomous insect bite of left ankle without infection, initial encounter -     Alpha-Gal Panel -     Lyme Disease Serology w/Reflex -     Hydrocortisone; Apply 1 Application topically 2 (two) times daily.  Dispense: 118 mL; Refill: 0   ASSESSMENT: Left  leg rash  Tick bite with localized reaction No sign of Lyme disease or other tickborne illness at this time  - Lyme & alpha-gal titers ordered  Apply ointment twice a day -Monitor for signs of infection such as increased redness warmth swelling or pus and systemic symptoms persist or/fever chills headache petechial rash  -Educate the patient about Lyme disease including signs and symptoms: Preventive measure and the importance of sleep check after  outdoor activities Possible prophylactic antibiotic if titers comes back positive  May need prophylactic treatment (Doxy) based on titer results  If you have labs done today, we will reach out when the results are back Plan of care discuss with client, all questions answered  Return if symptoms worsen or fail to improve.    Arrie Aran Santa Lighter, DNP [facility] [address] [place], [place] [address] [phone]

## 2023-03-16 LAB — LYME DISEASE SEROLOGY W/REFLEX: Lyme Total Antibody EIA: NEGATIVE

## 2023-03-17 DIAGNOSIS — R030 Elevated blood-pressure reading, without diagnosis of hypertension: Secondary | ICD-10-CM | POA: Diagnosis not present

## 2023-03-17 DIAGNOSIS — L03116 Cellulitis of left lower limb: Secondary | ICD-10-CM | POA: Diagnosis not present

## 2023-03-17 LAB — ALPHA-GAL PANEL
Allergen Lamb IgE: 0.33 kU/L — AB
Beef IgE: 0.58 kU/L — AB
IgE (Immunoglobulin E), Serum: 46 IU/mL (ref 6–495)
O215-IgE Alpha-Gal: 2.2 kU/L — AB
Pork IgE: 0.38 kU/L — AB

## 2023-03-21 ENCOUNTER — Encounter: Payer: Self-pay | Admitting: Family Medicine

## 2023-03-21 ENCOUNTER — Ambulatory Visit: Payer: Medicare Other | Admitting: Family Medicine

## 2023-03-21 ENCOUNTER — Other Ambulatory Visit: Payer: Self-pay | Admitting: Nurse Practitioner

## 2023-03-21 VITALS — BP 103/63 | HR 74 | Ht 69.0 in | Wt 177.0 lb

## 2023-03-21 DIAGNOSIS — L03116 Cellulitis of left lower limb: Secondary | ICD-10-CM | POA: Diagnosis not present

## 2023-03-21 DIAGNOSIS — Z91014 Allergy to mammalian meats: Secondary | ICD-10-CM | POA: Insufficient documentation

## 2023-03-21 DIAGNOSIS — Z91018 Allergy to other foods: Secondary | ICD-10-CM

## 2023-03-21 MED ORDER — EPINEPHRINE 0.3 MG/0.3ML IJ SOAJ
0.3000 mg | INTRAMUSCULAR | 1 refills | Status: DC | PRN
Start: 2023-03-21 — End: 2024-06-19

## 2023-03-21 MED ORDER — DOXYCYCLINE HYCLATE 100 MG PO TABS
100.0000 mg | ORAL_TABLET | Freq: Two times a day (BID) | ORAL | 0 refills | Status: DC
Start: 2023-03-21 — End: 2023-04-12

## 2023-03-21 NOTE — Progress Notes (Signed)
BP 103/63   Pulse 74   Ht 5\' 9"  (1.753 m)   Wt 177 lb (80.3 kg)   SpO2 95%   BMI 26.14 kg/m    Subjective:   Patient ID: David Schwartz, male    DOB: Jul 28, 1946, 77 y.o.   MRN: [id number]  HPI: David Schwartz is a 77 y.o. 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  We discussed being cautious.  They already sent in an EpiPen for him but discussed being cautious about eating it and if he starts getting any swelling or reaction or stomach issues that may be him having a reaction to it.  With his numbers being mild I do not expect anything severe but he just needs to be cautious.  Left lower extremity rash Patient has a left lower extremity rash on the lateral aspect of his lower leg that has more red center and clearing around it and then red on the outside as well, and is not a classic ring bull's-eye and it does appear like it has some cellulitis component to it as well but there is a possibility that it could be a bull's-eye ring.  He says it is getting better on the sulfa but not completely clearing and he is taking most of the antibiotic at this point.  Relevant past medical, surgical, family and social history reviewed and updated as indicated. Interim medical history since our last visit reviewed. Allergies and medications reviewed and updated.  Review of Systems  Constitutional:  Negative for chills and fever.  Eyes:  Negative for discharge.  Respiratory:  Negative for shortness of breath and wheezing.   Cardiovascular:  Negative for chest pain and leg swelling.  Musculoskeletal:  Negative for back pain and gait problem.  Skin:  Positive for color change and rash.  All other systems reviewed and are negative.   Per HPI unless specifically indicated above   Allergies as of 03/21/2023   No Known  Allergies      Medication List        Accurate as of March 21, 2023  4:09 PM. If you have any questions, ask your nurse or doctor.          acetaminophen 500 MG tablet Commonly known as: TYLENOL Take 500 mg by mouth every 6 (six) hours as needed for moderate pain.   alendronate 70 MG tablet Commonly known as: FOSAMAX TAKE 1 TABLET BY MOUTH WEEKLY  WITH 8 OZ OF PLAIN WATER 30  MINUTES BEFORE FIRST FOOD, DRINK OR MEDS. STAY UPRIGHT FOR 30  MINS   calcium carbonate 1500 (600 Ca) MG Tabs tablet Commonly known as: OSCAL Take 600 mg of elemental calcium by mouth daily with breakfast.   doxycycline 100 MG tablet Commonly known as: VIBRA-TABS Take 1 tablet (100 mg total) by mouth 2 (two) times daily. 1 po bid Started by: Elige Radon Shepard Keltz   Eliquis 5 MG Tabs tablet Generic drug: apixaban Take 1 tablet by mouth twice daily   EPINEPHrine 0.3 mg/0.3 mL Soaj injection Commonly known as: EPI-PEN Inject 0.3 mg into the muscle as needed for anaphylaxis. Started by: Richardson Landry DNP   hydrocortisone 1 % lotion Apply 1 Application topically 2 (two) times daily.   ketoconazole 2 % cream Commonly known as: NIZORAL Apply  1 Application topically daily as needed for irritation.   losartan 25 MG tablet Commonly known as: COZAAR Take 1 tablet by mouth once daily   metoprolol succinate 50 MG 24 hr tablet Commonly known as: TOPROL-XL TAKE 1 & 1/2 (ONE & ONE-HALF) TABLETS BY MOUTH ONCE DAILY   multivitamin with minerals Tabs tablet Take 1 tablet by mouth daily.   OSTEO BI-FLEX JOINT SHIELD PO Take 2 tablets by mouth daily.   pantoprazole 40 MG tablet Commonly known as: Protonix Take 1 tablet (40 mg total) by mouth daily.   rosuvastatin 5 MG tablet Commonly known as: CRESTOR Take 1 tablet by mouth daily   tadalafil 20 MG tablet Commonly known as: CIALIS Take 1 tablet (20 mg total) by mouth as needed for erectile dysfunction.   terazosin 2 MG capsule Commonly known as:  HYTRIN TAKE 1 CAPSULE BY MOUTH AT  BEDTIME   triamcinolone cream 0.1 % Commonly known as: KENALOG Apply 1 Application topically 2 (two) times daily.   Vitamin D3 50 MCG (2000 UT) Tabs Take 6,000 Units by mouth daily.         Objective:   BP 103/63   Pulse 74   Ht 5\' 9"  (1.753 m)   Wt 177 lb (80.3 kg)   SpO2 95%   BMI 26.14 kg/m   Wt Readings from Last 3 Encounters:  03/21/23 177 lb (80.3 kg)  03/15/23 177 lb 9.6 oz (80.6 kg)  02/01/23 179 lb (81.2 kg)    Physical Exam Vitals and nursing note reviewed.  Constitutional:      General: He is not in acute distress.    Appearance: He is well-developed. He is not diaphoretic.  Eyes:     General: No scleral icterus.       Right eye: No discharge.     Conjunctiva/sclera: Conjunctivae normal.     Pupils: Pupils are equal, round, and reactive to light.  Neck:     Thyroid: No thyromegaly.  Cardiovascular:     Rate and Rhythm: Normal rate and regular rhythm.     Heart sounds: Normal heart sounds. No murmur heard. Pulmonary:     Effort: Pulmonary effort is normal. No respiratory distress.     Breath sounds: Normal breath sounds. No wheezing.  Musculoskeletal:        General: Normal range of motion.     Cervical back: Neck supple.  Lymphadenopathy:     Cervical: No cervical adenopathy.  Skin:    General: Skin is warm and dry.     Findings: Erythema and rash present.     Comments: lateral aspect of his lower leg that has more red center and clearing around it and then red on the outside as well, and is not a classic ring bull's-eye and it does appear like it has some cellulitis component to it as well but there is a possibility that it could be a bull's-eye ring  Neurological:     Mental Status: He is alert and oriented to person, place, and time.     Coordination: Coordination normal.  Psychiatric:        Behavior: Behavior normal.     Results for orders placed or performed in visit on 03/15/23  Alpha-Gal Panel   Result Value Ref Range   Class Description Allergens Comment    IgE (Immunoglobulin E), Serum 46 6 - 495 IU/mL   [id number] Alpha-Gal 2.20 (A) Class III kU/L   Beef IgE 0.58 (A) Class II kU/L   Pork  IgE 0.38 (A) Class I kU/L   Allergen Lamb IgE 0.33 (A) Class I kU/L  Lyme Disease Serology w/Reflex  Result Value Ref Range   Lyme Total Antibody EIA Negative Negative    Assessment & Plan:   Problem List Items Addressed This Visit       Other   Allergy to alpha-gal   Other Visit Diagnoses     Cellulitis of left lower extremity    -  Primary   Relevant Medications   doxycycline (VIBRA-TABS) 100 MG tablet       Will treat like possible cellulitis versus Lyme's, will send in the doxycycline for recommended that he stop the sulfa and do the doxycycline Follow up plan: Return if symptoms worsen or fail to improve.  Counseling provided for all of the vaccine components No orders of the defined types were placed in this encounter.   Arville Care, MD [facility] 03/21/2023, 4:09 PM

## 2023-04-06 ENCOUNTER — Encounter: Payer: Self-pay | Admitting: Cardiovascular Disease

## 2023-04-06 ENCOUNTER — Ambulatory Visit: Payer: Medicare Other | Attending: Cardiovascular Disease | Admitting: Cardiovascular Disease

## 2023-04-06 VITALS — BP 116/70 | HR 70 | Ht 68.0 in | Wt 180.8 lb

## 2023-04-06 DIAGNOSIS — I4819 Other persistent atrial fibrillation: Secondary | ICD-10-CM | POA: Diagnosis not present

## 2023-04-06 NOTE — Patient Instructions (Signed)
Medication Instructions:  Continue all current medications.  Labwork: none  Testing/Procedures: none  Follow-Up: 1 year   Any Other Special Instructions Will Be Listed Below (If Applicable).  If you need a refill on your cardiac medications before your next appointment, please call your pharmacy.  

## 2023-04-06 NOTE — Progress Notes (Signed)
  Electrophysiology Office Note:    Date:  04/06/2023   ID:  David Schwartz, DOB [birth year], MRN [id number]  PCP:  Dettinger, Elige Radon, MD   [facility] Providers Cardiologist:  Dina Rich, MD     Referring MD: Dettinger, Elige Radon, MD   History of Present Illness:    David Schwartz is a 77 y.o. male with a hx listed below, significant for atrial fibrillation, HFrEF referred for arrhythmia management.     He was first diagnosed with atrial fibrillation in 2021. HR was elevated on presentation. He was managed with metoprolol and eliquis, and eventually underwent DCCV. EF was 35-40% at the time of cardioversion but normalize with sinus rhythm and normal heart rate.  He had recurrence of AF in 2023 and underwent DCCV again in October after complaining of fatigue. He had recurrence of AF within a few weeks. He reports that he has increased fatigue when in AF and felt better after his cardioversions.  He underwent atrial fibrillation ablation on Jan 04, 2023.  The procedure was uncomplicated.  He reports that he has been feeling very good since the ablation.        He reports that he is doing very well today.  He has not had any fatigue or palpitations to suggest recurrence of atrial fibrillation.     EKGs/Labs/Other Studies Reviewed Today:     TTE: Sep 05, 2022 EF 50-55%, mildly dilated LA, normal mitral valve  EKG:  Last EKG results: today - AF I have reviewed all ECGs available in [facility]. AF, when present, is coarse, and there appears to be good atrial electrical amplitude.  Recent Labs: 08/10/2022: TSH 1.360 10/12/2022: ALT 11 12/20/2022: BUN 17; Creatinine, Ser 0.77; Hemoglobin 14.6; Platelets 194; Potassium 4.5; Sodium 141     Physical Exam:    VS:  BP 116/70   Pulse 70   Ht 5\' 8"  (1.727 m)   Wt 180 lb 12.8 oz (82 kg)   SpO2 96%   BMI 27.49 kg/m     Wt Readings from Last 3 Encounters:  04/06/23 180 lb 12.8 oz (82 kg)  03/21/23 177 lb (80.3 kg)  03/15/23 177  lb 9.6 oz (80.6 kg)     GEN: Well nourished, well developed in no acute distress CARDIAC: Irregular rhythm, no murmurs, rubs, gallops RESPIRATORY:  Normal work of breathing MUSCULOSKELETAL: no edema    ASSESSMENT & PLAN:    Atrial fibrillation:  persistent, symptomatic with fatigue.   Status post atrial fibrillation ablation with posterior wall ablation on Jan 04, 2023 Doing well, maintaining sinus rhythm  Secondary hypercoagulable state CHA2DS2-VASc is 4 Continue Eliquis 5 mg  CHFrEF:  likely due to tachycardia and AF. EF has recovered.        Medication Adjustments/Labs and Tests Ordered: Current medicines are reviewed at length with the patient today.  Concerns regarding medicines are outlined above.  Orders Placed This Encounter  Procedures   EKG 12-Lead   No orders of the defined types were placed in this encounter.    Signed, Maurice Small, MD  04/06/2023 3:05 PM    [facility]

## 2023-04-12 ENCOUNTER — Encounter: Payer: Self-pay | Admitting: Family Medicine

## 2023-04-12 ENCOUNTER — Ambulatory Visit (INDEPENDENT_AMBULATORY_CARE_PROVIDER_SITE_OTHER): Payer: Medicare Other | Admitting: Family Medicine

## 2023-04-12 VITALS — BP 133/75 | HR 54 | Ht 68.0 in | Wt 175.0 lb

## 2023-04-12 DIAGNOSIS — I4819 Other persistent atrial fibrillation: Secondary | ICD-10-CM

## 2023-04-12 DIAGNOSIS — I10 Essential (primary) hypertension: Secondary | ICD-10-CM | POA: Diagnosis not present

## 2023-04-12 DIAGNOSIS — E782 Mixed hyperlipidemia: Secondary | ICD-10-CM

## 2023-04-12 LAB — CBC WITH DIFFERENTIAL/PLATELET
Basophils Absolute: 0 10*3/uL (ref 0.0–0.2)
Basos: 1 %
EOS (ABSOLUTE): 0.2 10*3/uL (ref 0.0–0.4)
Eos: 4 %
Hematocrit: 40.6 % (ref 37.5–51.0)
Hemoglobin: 13.7 g/dL (ref 13.0–17.7)
Immature Grans (Abs): 0 10*3/uL (ref 0.0–0.1)
Immature Granulocytes: 0 %
Lymphocytes Absolute: 1.1 10*3/uL (ref 0.7–3.1)
Lymphs: 20 %
MCH: 32.3 pg (ref 26.6–33.0)
MCHC: 33.7 g/dL (ref 31.5–35.7)
MCV: 96 fL (ref 79–97)
Monocytes Absolute: 0.4 10*3/uL (ref 0.1–0.9)
Monocytes: 7 %
Neutrophils Absolute: 3.9 10*3/uL (ref 1.4–7.0)
Neutrophils: 68 %
Platelets: 178 10*3/uL (ref 150–450)
RBC: 4.24 x10E6/uL (ref 4.14–5.80)
RDW: 13.9 % (ref 11.6–15.4)
WBC: 5.7 10*3/uL (ref 3.4–10.8)

## 2023-04-12 LAB — CMP14+EGFR

## 2023-04-12 LAB — LIPID PANEL

## 2023-04-12 NOTE — Progress Notes (Signed)
BP 133/75   Pulse (!) 54   Ht 5\' 8"  (1.727 m)   Wt 175 lb (79.4 kg)   SpO2 98%   BMI 26.61 kg/m    Subjective:   Patient ID: David Schwartz, male    DOB: [birth year], 77 y.o.   MRN: [id number]  HPI: David Schwartz is a 77 y.o. male presenting on 04/12/2023 for Medical Management of Chronic Issues, Hyperlipidemia, and Hypertension   HPI Hypertension and A-fib recheck Patient is currently on losartan and metoprolol, and their blood pressure today is 133/75. Patient denies any lightheadedness or dizziness. Patient denies headaches, blurred vision, chest pains, shortness of breath, or weakness. Denies any side effects from medication and is content with current medication.   Hyperlipidemia Patient is coming in for recheck of his hyperlipidemia. The patient is currently taking Crestor. They deny any issues with myalgias or history of liver damage from it. They deny any focal numbness or weakness or chest pain.   Relevant past medical, surgical, family and social history reviewed and updated as indicated. Interim medical history since our last visit reviewed. Allergies and medications reviewed and updated.  Review of Systems  Constitutional:  Negative for chills and fever.  Eyes:  Negative for visual disturbance.  Respiratory:  Negative for shortness of breath and wheezing.   Cardiovascular:  Negative for chest pain and leg swelling.  Musculoskeletal:  Negative for back pain and gait problem.  Skin:  Negative for rash.  Neurological:  Negative for dizziness, weakness and light-headedness.  All other systems reviewed and are negative.   Per HPI unless specifically indicated above   Allergies as of 04/12/2023   No Known Allergies      Medication List        Accurate as of April 12, 2023 10:07 AM. If you have any questions, ask your nurse or doctor.          STOP taking these medications    doxycycline 100 MG tablet Commonly known as: VIBRA-TABS Stopped by: Elige Radon         TAKE these medications    acetaminophen 500 MG tablet Commonly known as: TYLENOL Take 500 mg by mouth every 6 (six) hours as needed for moderate pain.   alendronate 70 MG tablet Commonly known as: FOSAMAX TAKE 1 TABLET BY MOUTH WEEKLY  WITH 8 OZ OF PLAIN WATER 30  MINUTES BEFORE FIRST FOOD, DRINK OR MEDS. STAY UPRIGHT FOR 30  MINS   calcium carbonate 1500 (600 Ca) MG Tabs tablet Commonly known as: OSCAL Take 600 mg of elemental calcium by mouth daily with breakfast.   Eliquis 5 MG Tabs tablet Generic drug: apixaban Take 1 tablet by mouth twice daily   EPINEPHrine 0.3 mg/0.3 mL Soaj injection Commonly known as: EPI-PEN Inject 0.3 mg into the muscle as needed for anaphylaxis.   hydrocortisone 1 % lotion Apply 1 Application topically 2 (two) times daily.   ketoconazole 2 % cream Commonly known as: NIZORAL Apply 1 Application topically daily as needed for irritation.   losartan 25 MG tablet Commonly known as: COZAAR Take 1 tablet by mouth once daily   metoprolol succinate 50 MG 24 hr tablet Commonly known as: TOPROL-XL TAKE 1 & 1/2 (ONE & ONE-HALF) TABLETS BY MOUTH ONCE DAILY   multivitamin with minerals Tabs tablet Take 1 tablet by mouth daily.   OSTEO BI-FLEX JOINT SHIELD PO Take 2 tablets by mouth daily.   pantoprazole 40 MG tablet Commonly known as: Protonix Take 1  tablet (40 mg total) by mouth daily.   rosuvastatin 5 MG tablet Commonly known as: CRESTOR Take 1 tablet by mouth daily   tadalafil 20 MG tablet Commonly known as: CIALIS Take 1 tablet (20 mg total) by mouth as needed for erectile dysfunction.   terazosin 2 MG capsule Commonly known as: HYTRIN TAKE 1 CAPSULE BY MOUTH AT  BEDTIME   triamcinolone cream 0.1 % Commonly known as: KENALOG Apply 1 Application topically 2 (two) times daily.   Vitamin D3 50 MCG (2000 UT) Tabs Take 6,000 Units by mouth daily.         Objective:   BP 133/75   Pulse (!) 54   Ht 5\' 8"   (1.727 m)   Wt 175 lb (79.4 kg)   SpO2 98%   BMI 26.61 kg/m   Wt Readings from Last 3 Encounters:  04/12/23 175 lb (79.4 kg)  04/06/23 180 lb 12.8 oz (82 kg)  03/21/23 177 lb (80.3 kg)    Physical Exam Vitals and nursing note reviewed.  Constitutional:      General: He is not in acute distress.    Appearance: He is well-developed. He is not diaphoretic.  Eyes:     General: No scleral icterus.    Conjunctiva/sclera: Conjunctivae normal.  Neck:     Thyroid: No thyromegaly.  Cardiovascular:     Rate and Rhythm: Normal rate and regular rhythm.     Pulses: Normal pulses.     Heart sounds: Normal heart sounds. No murmur heard. Pulmonary:     Effort: Pulmonary effort is normal. No respiratory distress.     Breath sounds: Normal breath sounds. No wheezing.  Musculoskeletal:        General: No swelling. Normal range of motion.     Cervical back: Neck supple.  Lymphadenopathy:     Cervical: No cervical adenopathy.  Skin:    General: Skin is warm and dry.     Findings: No rash.  Neurological:     Mental Status: He is alert and oriented to person, place, and time.     Coordination: Coordination normal.  Psychiatric:        Behavior: Behavior normal.       Assessment & Plan:   Problem List Items Addressed This Visit       Cardiovascular and Mediastinum   Hypertension   Relevant Orders   CBC with Differential/Platelet   CMP14+EGFR   Persistent atrial fibrillation (HCC)   Relevant Orders   CBC with Differential/Platelet     Other   Hyperlipemia - Primary   Relevant Orders   Lipid panel    Continue current medicines, seems to be doing well.  Blood pressure looks good today.  No changes Follow up plan: Return in about 6 months (around 10/13/2023), or if symptoms worsen or fail to improve, for Physical and hypertension cholesterol recheck.  Counseling provided for all of the vaccine components Orders Placed This Encounter  Procedures   CBC with Differential/Platelet    CMP14+EGFR   Lipid panel    Arville Care, MD [facility] 04/12/2023, 10:07 AM

## 2023-04-15 ENCOUNTER — Other Ambulatory Visit: Payer: Self-pay | Admitting: Cardiology

## 2023-04-20 ENCOUNTER — Other Ambulatory Visit: Payer: Medicare Other

## 2023-04-20 DIAGNOSIS — C61 Malignant neoplasm of prostate: Secondary | ICD-10-CM

## 2023-04-21 LAB — PSA: Prostate Specific Ag, Serum: 0.3 ng/mL (ref 0.0–4.0)

## 2023-04-27 ENCOUNTER — Telehealth: Payer: Self-pay

## 2023-04-27 ENCOUNTER — Ambulatory Visit: Payer: Medicare Other | Admitting: Urology

## 2023-04-27 VITALS — BP 113/71 | HR 65

## 2023-04-27 DIAGNOSIS — R3912 Poor urinary stream: Secondary | ICD-10-CM | POA: Diagnosis not present

## 2023-04-27 DIAGNOSIS — C61 Malignant neoplasm of prostate: Secondary | ICD-10-CM

## 2023-04-27 DIAGNOSIS — N401 Enlarged prostate with lower urinary tract symptoms: Secondary | ICD-10-CM

## 2023-04-27 DIAGNOSIS — N5201 Erectile dysfunction due to arterial insufficiency: Secondary | ICD-10-CM

## 2023-04-27 DIAGNOSIS — N138 Other obstructive and reflux uropathy: Secondary | ICD-10-CM

## 2023-04-27 LAB — URINALYSIS, ROUTINE W REFLEX MICROSCOPIC
Bilirubin, UA: NEGATIVE
Glucose, UA: NEGATIVE
Ketones, UA: NEGATIVE
Leukocytes,UA: NEGATIVE
Nitrite, UA: NEGATIVE
Protein,UA: NEGATIVE
RBC, UA: NEGATIVE
Specific Gravity, UA: 1.01 (ref 1.005–1.030)
Urobilinogen, Ur: 0.2 mg/dL (ref 0.2–1.0)
pH, UA: 6 (ref 5.0–7.5)

## 2023-04-27 MED ORDER — TERAZOSIN HCL 2 MG PO CAPS: 2.0000 mg | ORAL_CAPSULE | Freq: Every day | ORAL | 3 refills | Status: DC

## 2023-04-27 MED ORDER — SILDENAFIL CITRATE 100 MG PO TABS: 100.0000 mg | ORAL_TABLET | Freq: Every day | ORAL | 0 refills | Status: DC | PRN

## 2023-04-27 NOTE — Progress Notes (Unsigned)
04/27/2023 10:56 AM   David Schwartz 09-14-1945 [id number]  Referring provider: Dettinger, Elige Radon, MD [address] [place],  [place] [address]  No chief complaint on file.   HPI: David Schwartz is a 77yo here for followup for prostate cancer, BPH and, erectile dysfunction. PSA 0.3 off ADT. IPSS 6 QOL 1 on terazosin. He tried tadalafil 20mg  which failed to give him an erection.    PMH: Past Medical History:  Diagnosis Date   Arthritis    Atrial fibrillation (HCC)    Cancer (HCC)    Complication of anesthesia    difficult waking up   Dysrhythmia    Hyperlipidemia    Hypertension     Surgical History: Past Surgical History:  Procedure Laterality Date   ATRIAL FIBRILLATION ABLATION N/A 01/04/2023   Procedure: ATRIAL FIBRILLATION ABLATION;  Surgeon: Maurice Small, MD;  Location: [facility];  Service: Cardiovascular;  Laterality: N/A;   CARDIOVERSION N/A 01/29/2020   Procedure: CARDIOVERSION;  Surgeon: Jonelle Sidle, MD;  Location: [facility];  Service: Cardiovascular;  Laterality: N/A;   CARDIOVERSION N/A 06/16/2022   Procedure: CARDIOVERSION;  Surgeon: Christell Constant, MD;  Location: [facility];  Service: Cardiovascular;  Laterality: N/A;   GOLD SEED IMPLANT N/A 06/23/2021   Procedure: GOLD SEED IMPLANT;  Surgeon: Malen Gauze, MD;  Location: [facility];  Service: Urology;  Laterality: N/A;   HERNIA REPAIR     KNEE ARTHROSCOPY     SPACE OAR INSTILLATION N/A 06/23/2021   Procedure: SPACE OAR INSTILLATION;  Surgeon: Malen Gauze, MD;  Location: [facility];  Service: Urology;  Laterality: N/A;   TEE WITHOUT CARDIOVERSION N/A 01/04/2023   Procedure: TRANSESOPHAGEAL ECHOCARDIOGRAM;  Surgeon: Dolores Patty, MD;  [facility] INVASIVE CV LAB;  Service: Cardiovascular;  Laterality: N/A;    Home Medications:  Allergies as of 04/27/2023   No Known Allergies      Medication List        Accurate as of April 27, 2023 10:56 AM. If you have any questions,  ask your nurse or doctor.          acetaminophen 500 MG tablet Commonly known as: TYLENOL Take 500 mg by mouth every 6 (six) hours as needed for moderate pain.   alendronate 70 MG tablet Commonly known as: FOSAMAX TAKE 1 TABLET BY MOUTH WEEKLY  WITH 8 OZ OF PLAIN WATER 30  MINUTES BEFORE FIRST FOOD, DRINK OR MEDS. STAY UPRIGHT FOR 30  MINS   calcium carbonate 1500 (600 Ca) MG Tabs tablet Commonly known as: OSCAL Take 600 mg of elemental calcium by mouth daily with breakfast.   Eliquis 5 MG Tabs tablet Generic drug: apixaban Take 1 tablet by mouth twice daily   EPINEPHrine 0.3 mg/0.3 mL Soaj injection Commonly known as: EPI-PEN Inject 0.3 mg into the muscle as needed for anaphylaxis.   hydrocortisone 1 % lotion Apply 1 Application topically 2 (two) times daily.   ketoconazole 2 % cream Commonly known as: NIZORAL Apply 1 Application topically daily as needed for irritation.   losartan 25 MG tablet Commonly known as: COZAAR Take 1 tablet by mouth once daily   metoprolol succinate 50 MG 24 hr tablet Commonly known as: TOPROL-XL TAKE 1 & 1/2 (ONE & ONE-HALF) TABLETS BY MOUTH ONCE DAILY   multivitamin with minerals Tabs tablet Take 1 tablet by mouth daily.   OSTEO BI-FLEX JOINT SHIELD PO Take 2 tablets by mouth daily.   pantoprazole 40 MG tablet  Commonly known as: Protonix Take 1 tablet (40 mg total) by mouth daily.   rosuvastatin 5 MG tablet Commonly known as: CRESTOR Take 1 tablet by mouth daily   tadalafil 20 MG tablet Commonly known as: CIALIS Take 1 tablet (20 mg total) by mouth as needed for erectile dysfunction.   terazosin 2 MG capsule Commonly known as: HYTRIN TAKE 1 CAPSULE BY MOUTH AT  BEDTIME   triamcinolone cream 0.1 % Commonly known as: KENALOG Apply 1 Application topically 2 (two) times daily.   Vitamin D3 50 MCG (2000 UT) Tabs Take 6,000 Units by mouth daily.        Allergies: No Known Allergies  Family History: Family History   Problem Relation Age of Onset   Diabetes Mother    Hypertension Mother    Early death Father    Arthritis Maternal Grandmother    Hypertension Maternal Grandfather    Hypertension Paternal Grandmother    Cancer Paternal Grandfather        lung    Social History:  reports that he has never smoked. He has never used smokeless tobacco. He reports current alcohol use. He reports that he does not use drugs.  ROS: All other review of systems were reviewed and are negative except what is noted above in HPI  Physical Exam: BP 113/71   Pulse 65   Constitutional:  Alert and oriented, No acute distress. HEENT:  AT, moist mucus membranes.  Trachea midline, no masses. Cardiovascular: No clubbing, cyanosis, or edema. Respiratory: Normal respiratory effort, no increased work of breathing. GI: Abdomen is soft, nontender, nondistended, no abdominal masses GU: No CVA tenderness.  Lymph: No cervical or inguinal lymphadenopathy. Skin: No rashes, bruises or suspicious lesions. Neurologic: Grossly intact, no focal deficits, moving all 4 extremities. Psychiatric: Normal mood and affect.  Laboratory Data: Lab Results  Component Value Date   WBC 5.7 04/12/2023   HGB 13.7 04/12/2023   HCT 40.6 04/12/2023   MCV 96 04/12/2023   PLT 178 04/12/2023    Lab Results  Component Value Date   CREATININE 0.80 04/12/2023    No results found for: "PSA"  No results found for: "TESTOSTERONE"  No results found for: "HGBA1C"  Urinalysis    Component Value Date/Time   APPEARANCEUR Clear 10/25/2022 1053   GLUCOSEU Negative 10/25/2022 1053   BILIRUBINUR Negative 10/25/2022 1053   PROTEINUR Negative 10/25/2022 1053   NITRITE Negative 10/25/2022 1053   LEUKOCYTESUR Negative 10/25/2022 1053    Lab Results  Component Value Date   LABMICR Comment 10/25/2022    Pertinent Imaging: *** No results found for this or any previous visit.  No results found for this or any previous visit.  No  results found for this or any previous visit.  No results found for this or any previous visit.  No results found for this or any previous visit.  No valid procedures specified. No results found for this or any previous visit.  No results found for this or any previous visit.   Assessment & Plan:    1. Prostate cancer (HCC) *** - Urinalysis, Routine w reflex microscopic  2. Weak urinary stream ***  3. Benign prostatic hyperplasia with urinary obstruction ***  4. Erectile dysfunction due to arterial insufficiency ***   No follow-ups on file.  Wilkie Aye, MD  [facility] [place]

## 2023-04-27 NOTE — Telephone Encounter (Signed)
Patient called after hours triage with following questions. Given new medication today- Terazosin. Patient wants to make sure safe to take with Afib Message sent to provider

## 2023-05-02 ENCOUNTER — Telehealth: Payer: Self-pay

## 2023-05-02 ENCOUNTER — Encounter: Payer: Self-pay | Admitting: Urology

## 2023-05-02 NOTE — Telephone Encounter (Signed)
Patient states that he found out the information he needed.

## 2023-05-02 NOTE — Patient Instructions (Signed)

## 2023-05-02 NOTE — Telephone Encounter (Signed)
Patient left a voice message.  Requesting a call back to ask another question he forgot to ask.  Please advise.

## 2023-05-10 NOTE — Telephone Encounter (Signed)
Patient called and aware.

## 2023-05-15 ENCOUNTER — Other Ambulatory Visit: Payer: Self-pay | Admitting: Cardiology

## 2023-05-15 DIAGNOSIS — R000 Tachycardia, unspecified: Secondary | ICD-10-CM

## 2023-05-18 ENCOUNTER — Other Ambulatory Visit: Payer: Self-pay | Admitting: Family Medicine

## 2023-05-21 ENCOUNTER — Ambulatory Visit: Payer: Medicare Other | Attending: Cardiology | Admitting: Cardiology

## 2023-05-21 VITALS — BP 118/68 | HR 76 | Ht 68.0 in | Wt 176.6 lb

## 2023-05-21 DIAGNOSIS — E782 Mixed hyperlipidemia: Secondary | ICD-10-CM

## 2023-05-21 DIAGNOSIS — I4891 Unspecified atrial fibrillation: Secondary | ICD-10-CM

## 2023-05-21 DIAGNOSIS — I10 Essential (primary) hypertension: Secondary | ICD-10-CM

## 2023-05-21 DIAGNOSIS — I5032 Chronic diastolic (congestive) heart failure: Secondary | ICD-10-CM

## 2023-05-21 NOTE — Patient Instructions (Signed)
Medication Instructions:  Continue all current medications.  Labwork: none  Testing/Procedures: none  Follow-Up: 6 months   Any Other Special Instructions Will Be Listed Below (If Applicable).  If you need a refill on your cardiac medications before your next appointment, please call your pharmacy.  

## 2023-05-21 NOTE — Progress Notes (Signed)
Clinical Summary Mr. Hartline is a 77 y.o.male seen today for follow up of the following medical problems.     1. Afib - new diagnosis noted by pcp during 12/25/19 visit - ekg from pcp reviewed, afib rate 108 - CHADS 2vasc score is at least 2 (age, HTN), started on eliquis and toprol 25mg   - prior cardioversions, in general he does not feel well in afib even when rate controlled.    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 Prostate cancer - followed by urology - considering radiation   4. Hyperlipidemia 04/2021 TC 170 TG 130 HDL 44 LDL 103  10/2021 TC 161 TG 096 HDL 54 LDL 94 04/2022 TC 168 TG 149 HDL 49 LDL 93 - he is on crestor   04/2023 TC 149 TG 105 HDL 49 LDL 81     Grandson is at [facility], senior. His son lives [place].  Past Medical History:  Diagnosis Date   Arthritis    Atrial fibrillation (HCC)    Cancer (HCC)    Complication of anesthesia    difficult waking up   Dysrhythmia    Hyperlipidemia    Hypertension      No Known Allergies   Current Outpatient Medications  Medication Sig Dispense Refill   acetaminophen (TYLENOL) 500 MG tablet Take 500 mg by mouth every 6 (six) hours as needed for moderate pain.     alendronate (FOSAMAX) 70 MG tablet TAKE 1 TABLET BY MOUTH WEEKLY  WITH 8 OZ OF PLAIN WATER 30  MINUTES BEFORE FIRST FOOD, DRINK OR MEDS. STAY UPRIGHT FOR 30  MINS 12 tablet 1   calcium carbonate (OSCAL) 1500 (600 Ca) MG TABS tablet Take 600 mg of elemental calcium by mouth daily with breakfast.     Cholecalciferol (VITAMIN D3) 50 MCG (2000 UT) TABS Take 6,000 Units by mouth daily.     ELIQUIS 5 MG TABS tablet Take 1 tablet by mouth twice daily 60  tablet 6   EPINEPHrine 0.3 mg/0.3 mL IJ SOAJ injection Inject 0.3 mg into the muscle as needed for anaphylaxis. 1 each 1   hydrocortisone 1 % lotion Apply 1 Application topically 2 (two) times daily. 118 mL 0   ketoconazole (NIZORAL) 2 % cream Apply 1 Application topically daily as needed for irritation.     losartan (COZAAR) 25 MG tablet Take 1 tablet by mouth once daily 90 tablet 2   metoprolol succinate (TOPROL-XL) 50 MG 24 hr tablet TAKE 1 & 1/2 (ONE & ONE-HALF) TABLETS BY MOUTH ONCE DAILY 45 tablet 1   Misc Natural Products (OSTEO BI-FLEX JOINT SHIELD PO) Take 2 tablets by mouth daily.     Multiple Vitamin (MULTIVITAMIN WITH MINERALS) TABS tablet Take 1 tablet by mouth daily.     pantoprazole (PROTONIX) 40 MG tablet Take 1 tablet (40 mg total) by mouth daily. (Patient not taking: Reported on 04/06/2023) 30 tablet 0   rosuvastatin (CRESTOR) 5 MG tablet Take 1 tablet by mouth daily 90 tablet 3   sildenafil (VIAGRA) 100 MG tablet Take 1 tablet (100 mg total) by mouth daily as needed for erectile dysfunction.  10 tablet 0   tadalafil (CIALIS) 20 MG tablet Take 1 tablet (20 mg total) by mouth as needed for erectile dysfunction. 20 tablet 5   terazosin (HYTRIN) 2 MG capsule Take 1 capsule (2 mg total) by mouth at bedtime. 90 capsule 3   triamcinolone cream (KENALOG) 0.1 % Apply 1 Application topically 2 (two) times daily. 80 g 3   No current facility-administered medications for this visit.     Past Surgical History:  Procedure Laterality Date   ATRIAL FIBRILLATION ABLATION N/A 01/04/2023   Procedure: ATRIAL FIBRILLATION ABLATION;  Surgeon: Maurice Small, MD;  Location: [facility];  Service: Cardiovascular;  Laterality: N/A;   CARDIOVERSION N/A 01/29/2020   Procedure: CARDIOVERSION;  Surgeon: Jonelle Sidle, MD;  Location: [facility];  Service: Cardiovascular;  Laterality: N/A;   CARDIOVERSION N/A 06/16/2022   Procedure: CARDIOVERSION;  Surgeon: Christell Constant, MD;  Location:  [facility];  Service: Cardiovascular;  Laterality: N/A;   GOLD SEED IMPLANT N/A 06/23/2021   Procedure: GOLD SEED IMPLANT;  Surgeon: Malen Gauze, MD;  Location: [facility];  Service: Urology;  Laterality: N/A;   HERNIA REPAIR     KNEE ARTHROSCOPY     SPACE OAR INSTILLATION N/A 06/23/2021   Procedure: SPACE OAR INSTILLATION;  Surgeon: Malen Gauze, MD;  Location: [facility];  Service: Urology;  Laterality: N/A;   TEE WITHOUT CARDIOVERSION N/A 01/04/2023   Procedure: TRANSESOPHAGEAL ECHOCARDIOGRAM;  Surgeon: Dolores Patty, MD;  Location[facility] INVASIVE CV LAB;  Service: Cardiovascular;  Laterality: N/A;     No Known Allergies    Family History  Problem Relation Age of Onset   Diabetes Mother    Hypertension Mother    Early death Father    Arthritis Maternal Grandmother    Hypertension Maternal Grandfather    Hypertension Paternal Grandmother    Cancer Paternal Grandfather        lung     Social History Mr. Ellenbecker reports that he has never smoked. He has never used smokeless tobacco. Mr. Dente reports current alcohol use.   Review of Systems CONSTITUTIONAL: No weight loss, fever, chills, weakness or fatigue.  HEENT: Eyes: No visual loss, blurred vision, double vision or yellow sclerae.No hearing loss, sneezing, congestion, runny nose or sore throat.  SKIN: No rash or itching.  CARDIOVASCULAR: per hpi RESPIRATORY: No shortness of breath, cough or sputum.  GASTROINTESTINAL: No anorexia, nausea, vomiting or diarrhea. No abdominal pain or blood.  GENITOURINARY: No burning on urination, no polyuria NEUROLOGICAL: No headache, dizziness, syncope, paralysis, ataxia, numbness or tingling in the extremities. No change in bowel or bladder control.  MUSCULOSKELETAL: No muscle, back pain, joint pain or stiffness.  LYMPHATICS: No enlarged nodes. No history of splenectomy.  PSYCHIATRIC: No history of depression or anxiety.  ENDOCRINOLOGIC: No reports of sweating, cold or heat  intolerance. No polyuria or polydipsia.  Marland Kitchen   Physical Examination Today's Vitals   05/21/23 0914  BP: 118/68  Pulse: 76  SpO2: 98%  Weight: 176 lb 9.6 oz (80.1 kg)  Height: 5\' 8"  (1.727 m)   Body mass index is 26.85 kg/m.  Gen: resting comfortably, no acute distress HEENT: no scleral icterus, pupils equal round and reactive, no palptable cervical adenopathy,  CV: RRR, no m/rg, no jvd Resp: Clear to auscultation bilaterally GI: abdomen is soft, non-tender, non-distended, normal bowel sounds, no hepatosplenomegaly MSK: extremities are warm, no edema.  Skin: warm, no rash Neuro:  no focal deficits Psych: appropriate affect  Diagnostic Studies  01/2020 echo   IMPRESSIONS     1. Left ventricular ejection fraction, by estimation, is 35 to 40%. The  left ventricle has moderately decreased function. The left ventricle  demonstrates global hypokinesis. There is mild left ventricular  hypertrophy of the posterior segment. Left  ventricular diastolic parameters are indeterminate.   2. Right ventricular systolic function is normal. The right ventricular  size is normal. There is normal pulmonary artery systolic pressure.   3. Left atrial size was moderately dilated.   4. Right atrial size was mildly dilated.   5. The mitral valve is degenerative. Mild to moderate mitral valve  regurgitation.   6. Tricuspid valve regurgitation is moderate.   7. The aortic valve is tricuspid. Aortic valve regurgitation is mild. No  aortic stenosis is present.   8. The inferior vena cava is normal in size with greater than 50%  respiratory variability, suggesting right atrial pressure of 3 mmHg.    04/2020 echo   IMPRESSIONS     1. Left ventricular ejection fraction, by estimation, is 60 to 65%. The  left ventricle has normal function. The left ventricle has no regional  wall motion abnormalities. Left ventricular diastolic parameters are  consistent with Grade I diastolic  dysfunction  (impaired relaxation).   2. Right ventricular systolic function is normal. The right ventricular  size is normal. There is normal pulmonary artery systolic pressure.   3. Left atrial size was moderately dilated.   4. The mitral valve is normal in structure. No evidence of mitral valve  regurgitation. No evidence of mitral stenosis.   5. The aortic valve is tricuspid. Aortic valve regurgitation is mild. No  aortic stenosis is present.   6. The inferior vena cava is normal in size with greater than 50%  respiratory variability, suggesting right atrial pressure of 3 mmHg.        Assessment and Plan  1. Afib/acquired thrombophilia -historically did not tolerate afib even when rate controlled.  - doing well after ablation 01/2023. 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 HTN  -bp is at goal, continue current meds   4. Hyperlipidemia - LDL is at goal, continue current meds   F/u 6 months   Antoine Poche, M.D.

## 2023-07-06 ENCOUNTER — Other Ambulatory Visit: Payer: Self-pay | Admitting: Cardiology

## 2023-07-06 DIAGNOSIS — R000 Tachycardia, unspecified: Secondary | ICD-10-CM

## 2023-08-06 ENCOUNTER — Other Ambulatory Visit: Payer: Self-pay | Admitting: Urology

## 2023-09-11 ENCOUNTER — Other Ambulatory Visit: Payer: Self-pay | Admitting: Cardiology

## 2023-09-11 DIAGNOSIS — R000 Tachycardia, unspecified: Secondary | ICD-10-CM

## 2023-10-15 ENCOUNTER — Encounter: Payer: Self-pay | Admitting: Family Medicine

## 2023-10-15 ENCOUNTER — Ambulatory Visit: Payer: Medicare Other | Admitting: Family Medicine

## 2023-10-15 VITALS — BP 129/81 | HR 76 | Ht 68.0 in | Wt 172.0 lb

## 2023-10-15 DIAGNOSIS — E782 Mixed hyperlipidemia: Secondary | ICD-10-CM | POA: Diagnosis not present

## 2023-10-15 DIAGNOSIS — R972 Elevated prostate specific antigen [PSA]: Secondary | ICD-10-CM

## 2023-10-15 DIAGNOSIS — I10 Essential (primary) hypertension: Secondary | ICD-10-CM

## 2023-10-15 DIAGNOSIS — I4819 Other persistent atrial fibrillation: Secondary | ICD-10-CM

## 2023-10-15 DIAGNOSIS — Z0001 Encounter for general adult medical examination with abnormal findings: Secondary | ICD-10-CM

## 2023-10-15 DIAGNOSIS — Z0000 Encounter for general adult medical examination without abnormal findings: Secondary | ICD-10-CM | POA: Diagnosis not present

## 2023-10-15 LAB — LIPID PANEL

## 2023-10-15 NOTE — Progress Notes (Signed)
BP 129/81   Pulse 76   Ht 5\' 8"  (1.727 m)   Wt 78 kg   SpO2 97%   BMI 26.15 kg/m    Subjective:   Patient ID: David Schwartz, male    DOB: 1946-02-03, 78 y.o.   MRN: [id number]  HPI: David Schwartz is a 78 y.o. male presenting on 10/15/2023 for Medical Management of Chronic Issues, Hyperlipidemia, and Hypertension   Hyperlipidemia Pertinent negatives include no chest pain, myalgias or shortness of breath.  Hypertension Pertinent negatives include no chest pain, headaches, palpitations or shortness of breath.   Hypertension and A-Fib: Current managed on Losartan and Metoprolol with blood pressure today 129/81. Managed on Eliquis for A-Fib. He continues to take as prescribed. Denies lightheadedness, headaches, dizziness, or vision changes. Denies chest pain, palpations, or shortness of breath.  Hyperlipidemia: Currently managed on rosuvastatin. He continues to take as prescribed. Denies issues of myalgia or history of liver damage.  He is also here for his yearly physical. He is doing well and trying to continue to be active throughout the day. He currently has no concerns. He reports he knows he has a history for alpha-gal syndrome, but has been eating red meat occasionally. Denies any rash, swelling, shortness of breath or other reactions after consuming. Has some urinary retention that he mentions wakes him up a few times throughout the night to void. He has an appointment scheduled for Urology in the coming months. Denies fever or chills. Denies any bruising or blood in stool, or palpitations.  Relevant past medical, surgical, family and social history reviewed and updated as indicated. Interim medical history since our last visit reviewed. Allergies and medications reviewed and updated.  Review of Systems  Constitutional:  Negative for chills and fever.  Eyes:  Negative for visual disturbance.  Respiratory:  Negative for chest tightness and shortness of breath.    Cardiovascular:  Negative for chest pain, palpitations and leg swelling.  Gastrointestinal:  Negative for blood in stool, diarrhea and nausea.  Genitourinary:  Positive for frequency.  Musculoskeletal:  Negative for back pain, gait problem and myalgias.  Neurological:  Negative for dizziness, weakness, light-headedness, numbness and headaches.  Hematological:  Does not bruise/bleed easily.  Psychiatric/Behavioral:  Negative for agitation.   All other systems reviewed and are negative.   Per HPI unless specifically indicated above   Allergies as of 10/15/2023   No Known Allergies      Medication List        Accurate as of October 15, 2023 11:56 AM. If you have any questions, ask your nurse or doctor.          STOP taking these medications    pantoprazole 40 MG tablet Commonly known as: Protonix Stopped by: Elige Radon Clemence Stillings       TAKE these medications    acetaminophen 500 MG tablet Commonly known as: TYLENOL Take 500 mg by mouth every 6 (six) hours as needed for moderate pain.   alendronate 70 MG tablet Commonly known as: FOSAMAX TAKE 1 TABLET BY MOUTH WEEKLY  WITH 8 OZ OF PLAIN WATER 30  MINUTES BEFORE FIRST FOOD, DRINK OR MEDS. STAY UPRIGHT FOR 30  MINS   calcium carbonate 1500 (600 Ca) MG Tabs tablet Commonly known as: OSCAL Take 600 mg of elemental calcium by mouth daily with breakfast.   Eliquis 5 MG Tabs tablet Generic drug: apixaban Take 1 tablet by mouth twice daily   EPINEPHrine 0.3 mg/0.3 mL Soaj injection Commonly known  as: EPI-PEN Inject 0.3 mg into the muscle as needed for anaphylaxis.   hydrocortisone 1 % lotion Apply 1 Application topically 2 (two) times daily.   ketoconazole 2 % cream Commonly known as: NIZORAL Apply 1 Application topically daily as needed for irritation.   losartan 25 MG tablet Commonly known as: COZAAR Take 1 tablet by mouth once daily   metoprolol succinate 50 MG 24 hr tablet Commonly known as:  TOPROL-XL TAKE 1 & 1/2 (ONE & ONE-HALF) TABLETS BY MOUTH ONCE DAILY   multivitamin with minerals Tabs tablet Take 1 tablet by mouth daily.   OSTEO BI-FLEX JOINT SHIELD PO Take 2 tablets by mouth daily.   rosuvastatin 5 MG tablet Commonly known as: CRESTOR Take 1 tablet by mouth daily   sildenafil 100 MG tablet Commonly known as: VIAGRA TAKE 1 TABLET BY MOUTH ONCE DAILY AS NEEDED FOR ERECTILE DYSFUNCTION   tadalafil 20 MG tablet Commonly known as: CIALIS Take 1 tablet (20 mg total) by mouth as needed for erectile dysfunction.   terazosin 2 MG capsule Commonly known as: HYTRIN Take 1 capsule (2 mg total) by mouth at bedtime.   triamcinolone cream 0.1 % Commonly known as: KENALOG Apply 1 Application topically 2 (two) times daily.   Vitamin D3 50 MCG (2000 UT) Tabs Take 6,000 Units by mouth daily.         Objective:   BP 129/81   Pulse 76   Ht 5\' 8"  (1.727 m)   Wt 78 kg   SpO2 97%   BMI 26.15 kg/m   Wt Readings from Last 3 Encounters:  10/15/23 78 kg  05/21/23 80.1 kg  04/12/23 79.4 kg    Physical Exam Vitals reviewed.  Constitutional:      General: He is not in acute distress.    Appearance: Normal appearance. He is well-developed.  HENT:     Right Ear: Hearing, tympanic membrane, ear canal and external ear normal.     Left Ear: Hearing, tympanic membrane, ear canal and external ear normal.  Neck:     Thyroid: No thyromegaly.  Cardiovascular:     Rate and Rhythm: Normal rate and regular rhythm.     Heart sounds: Normal heart sounds. No murmur heard. Pulmonary:     Effort: Pulmonary effort is normal. No accessory muscle usage or respiratory distress.     Breath sounds: Normal breath sounds. No wheezing.  Abdominal:     General: Abdomen is flat. Bowel sounds are normal. There is no distension.     Palpations: Abdomen is soft. There is no mass.     Tenderness: There is no abdominal tenderness.     Hernia: No hernia is present.  Musculoskeletal:         General: Normal range of motion.  Lymphadenopathy:     Cervical: No cervical adenopathy.  Skin:    General: Skin is warm and dry.     Capillary Refill: Capillary refill takes less than 2 seconds.  Neurological:     Mental Status: He is alert and oriented to person, place, and time.  Psychiatric:        Mood and Affect: Mood normal.        Behavior: Behavior normal.       Assessment & Plan:   Problem List Items Addressed This Visit       Cardiovascular and Mediastinum   Hypertension   Relevant Orders   Lipid panel   Persistent atrial fibrillation (HCC)   Relevant Orders  CBC with Differential/Platelet     Other   Hyperlipemia   Relevant Orders   CMP14+EGFR   Elevated PSA measurement   Relevant Orders   PSA, total and free   Other Visit Diagnoses       Physical exam    -  Primary   Relevant Orders   CBC with Differential/Platelet   CMP14+EGFR   Lipid panel   PSA, total and free       He seems to be doing well on medications. Blood pressure today is 129/81. Continue on current management of blood pressure along with monitoring at home. Continue on current management for hyperlipidemia. Will check CBC and CMP with lipid panel. He is taking Eliquis for his A.Fib and is doing fine on that currently. He does report eating red meat at times and with his history of Alpha-gal syndrome we discussed being aware to avoid if possible or not to consume too much red meat. Discussed symptoms to be aware of for any anaphylactic reactions. He has an appointment to see Urology soon, we will check PSA level today. Other then issues mentioned he is doing pretty well. We will follow up in 6 months or sooner if he has any symptoms or issues that cause concern.  Follow up plan: Return in about 6 months (around 04/13/2024), or if symptoms worsen or fail to improve, for Hypertension-.  Counseling provided for all of the vaccine components Orders Placed This Encounter  Procedures   CBC  with Differential/Platelet   CMP14+EGFR   Lipid panel   PSA, total and free    Maximino Sarin PA-S [facility] 10/15/2023, 11:56 AM   I was personally present for all components of the history, physical exam and/or medical decision making.  I agree with the documentation performed by the student and agree with assessment and plan above.  Arville Care, MD [facility] 10/25/2023, 12:00 PM

## 2023-10-16 LAB — CMP14+EGFR
ALT: 11 IU/L (ref 0–44)
AST: 21 IU/L (ref 0–40)
Albumin: 4.4 g/dL (ref 3.8–4.8)
Alkaline Phosphatase: 51 IU/L (ref 44–121)
BUN/Creatinine Ratio: 17 (ref 10–24)
BUN: 13 mg/dL (ref 8–27)
Bilirubin Total: 0.6 mg/dL (ref 0.0–1.2)
CO2: 24 mmol/L (ref 20–29)
Calcium: 9.2 mg/dL (ref 8.6–10.2)
Chloride: 101 mmol/L (ref 96–106)
Creatinine, Ser: 0.75 mg/dL — ABNORMAL LOW (ref 0.76–1.27)
Globulin, Total: 2.2 g/dL (ref 1.5–4.5)
Glucose: 89 mg/dL (ref 70–99)
Potassium: 4.5 mmol/L (ref 3.5–5.2)
Sodium: 140 mmol/L (ref 134–144)
Total Protein: 6.6 g/dL (ref 6.0–8.5)
eGFR: 93 mL/min/{1.73_m2} (ref >59)

## 2023-10-16 LAB — LIPID PANEL
Cholesterol, Total: 153 mg/dL (ref 100–199)
HDL: 48 mg/dL (ref >39)
LDL CALC COMMENT:: 3.2 ratio (ref 0.0–5.0)
LDL Chol Calc (NIH): 82 mg/dL (ref 0–99)
Triglycerides: 132 mg/dL (ref 0–149)
VLDL Cholesterol Cal: 23 mg/dL (ref 5–40)

## 2023-10-16 LAB — CBC WITH DIFFERENTIAL/PLATELET
Basophils Absolute: 0.1 10*3/uL (ref 0.0–0.2)
Basos: 1 %
EOS (ABSOLUTE): 0.3 10*3/uL (ref 0.0–0.4)
Eos: 5 %
Hematocrit: 40.9 % (ref 37.5–51.0)
Hemoglobin: 14.2 g/dL (ref 13.0–17.7)
Immature Grans (Abs): 0 10*3/uL (ref 0.0–0.1)
Immature Granulocytes: 0 %
Lymphocytes Absolute: 1.3 10*3/uL (ref 0.7–3.1)
Lymphs: 23 %
MCH: 32.6 pg (ref 26.6–33.0)
MCHC: 34.7 g/dL (ref 31.5–35.7)
MCV: 94 fL (ref 79–97)
Monocytes Absolute: 0.5 10*3/uL (ref 0.1–0.9)
Monocytes: 8 %
Neutrophils Absolute: 3.8 10*3/uL (ref 1.4–7.0)
Neutrophils: 63 %
Platelets: 207 10*3/uL (ref 150–450)
RBC: 4.36 x10E6/uL (ref 4.14–5.80)
RDW: 12.6 % (ref 11.6–15.4)
WBC: 5.9 10*3/uL (ref 3.4–10.8)

## 2023-10-16 LAB — PSA, TOTAL AND FREE
PSA, Free Pct: 10 %
PSA, Free: 0.03 ng/mL
Prostate Specific Ag, Serum: 0.3 ng/mL (ref 0.0–4.0)

## 2023-10-25 ENCOUNTER — Encounter: Payer: Self-pay | Admitting: Family Medicine

## 2023-10-29 ENCOUNTER — Other Ambulatory Visit: Payer: Self-pay

## 2023-10-29 ENCOUNTER — Other Ambulatory Visit: Payer: Medicare Other

## 2023-10-29 DIAGNOSIS — C61 Malignant neoplasm of prostate: Secondary | ICD-10-CM

## 2023-10-30 LAB — PSA: Prostate Specific Ag, Serum: 0.4 ng/mL (ref 0.0–4.0)

## 2023-10-31 ENCOUNTER — Other Ambulatory Visit: Payer: Self-pay | Admitting: Family Medicine

## 2023-11-05 ENCOUNTER — Encounter: Payer: Self-pay | Admitting: *Deleted

## 2023-11-06 ENCOUNTER — Ambulatory Visit: Payer: Medicare Other | Admitting: Cardiology

## 2023-11-07 ENCOUNTER — Encounter: Payer: Self-pay | Admitting: Urology

## 2023-11-07 ENCOUNTER — Ambulatory Visit: Payer: Medicare Other | Admitting: Urology

## 2023-11-07 VITALS — BP 131/70 | HR 76

## 2023-11-07 DIAGNOSIS — C61 Malignant neoplasm of prostate: Secondary | ICD-10-CM

## 2023-11-07 DIAGNOSIS — N5201 Erectile dysfunction due to arterial insufficiency: Secondary | ICD-10-CM

## 2023-11-07 DIAGNOSIS — N138 Other obstructive and reflux uropathy: Secondary | ICD-10-CM

## 2023-11-07 DIAGNOSIS — R3912 Poor urinary stream: Secondary | ICD-10-CM

## 2023-11-07 DIAGNOSIS — N401 Enlarged prostate with lower urinary tract symptoms: Secondary | ICD-10-CM

## 2023-11-07 LAB — URINALYSIS, ROUTINE W REFLEX MICROSCOPIC
Bilirubin, UA: NEGATIVE
Glucose, UA: NEGATIVE
Ketones, UA: NEGATIVE
Leukocytes,UA: NEGATIVE
Nitrite, UA: NEGATIVE
Protein,UA: NEGATIVE
RBC, UA: NEGATIVE
Specific Gravity, UA: <1.005 — ABNORMAL LOW (ref 1.005–1.030)
Urobilinogen, Ur: 0.2 mg/dL (ref 0.2–1.0)
pH, UA: 7 (ref 5.0–7.5)

## 2023-11-07 MED ORDER — SILDENAFIL CITRATE 100 MG PO TABS: 100.0000 mg | ORAL_TABLET | Freq: Every day | ORAL | 0 refills | Status: DC | PRN

## 2023-11-07 NOTE — Patient Instructions (Signed)

## 2023-11-07 NOTE — Progress Notes (Signed)
 11/07/2023 11:13 AM   David Schwartz 10/14/1945 [id number]  Referring provider: Dettinger, Elige Radon, MD [address] [place],  [place] [address]  No chief complaint on file.   HPI: David Schwartz is a 78yo here for followup for prostate cancer, BPh and erectile dysfunction. PSA 0.4. IPSS 9 QOL 2 on terazosin. Urine stream strong. He has intermittent urinary urgency. He has nocturia 2-3x. He uses sildenafil 100mg  prn with mixed results   PMH: Past Medical History:  Diagnosis Date   Arthritis    Atrial fibrillation (HCC)    Cancer (HCC)    Complication of anesthesia    difficult waking up   Dysrhythmia    Hyperlipidemia    Hypertension     Surgical History: Past Surgical History:  Procedure Laterality Date   ATRIAL FIBRILLATION ABLATION N/A 01/04/2023   Procedure: ATRIAL FIBRILLATION ABLATION;  Surgeon: Maurice Small, MD;  Location: [facility];  Service: Cardiovascular;  Laterality: N/A;   CARDIOVERSION N/A 01/29/2020   Procedure: CARDIOVERSION;  Surgeon: Jonelle Sidle, MD;  Location: [facility];  Service: Cardiovascular;  Laterality: N/A;   CARDIOVERSION N/A 06/16/2022   Procedure: CARDIOVERSION;  Surgeon: Christell Constant, MD;  Location: [facility];  Service: Cardiovascular;  Laterality: N/A;   GOLD SEED IMPLANT N/A 06/23/2021   Procedure: GOLD SEED IMPLANT;  Surgeon: Malen Gauze, MD;  Location: [facility];  Service: Urology;  Laterality: N/A;   HERNIA REPAIR     KNEE ARTHROSCOPY     SPACE OAR INSTILLATION N/A 06/23/2021   Procedure: SPACE OAR INSTILLATION;  Surgeon: Malen Gauze, MD;  Location: [facility];  Service: Urology;  Laterality: N/A;   TEE WITHOUT CARDIOVERSION N/A 01/04/2023   Procedure: TRANSESOPHAGEAL ECHOCARDIOGRAM;  Surgeon: Dolores Patty, MD;  Location: [facility] INVASIVE CV LAB;  Service: Cardiovascular;  Laterality: N/A;    Home Medications:  Allergies as of 11/07/2023   No Known Allergies      Medication List        Accurate as  of November 07, 2023 11:13 AM. If you have any questions, ask your nurse or doctor.          acetaminophen 500 MG tablet Commonly known as: TYLENOL Take 500 mg by mouth every 6 (six) hours as needed for moderate pain.   alendronate 70 MG tablet Commonly known as: FOSAMAX TAKE 1 TABLET BY MOUTH WEEKLY  WITH 8 OZ OF PLAIN WATER 30  MINUTES BEFORE FIRST FOOD, DRINK OR MEDS. STAY UPRIGHT FOR 30  MINS   calcium carbonate 1500 (600 Ca) MG Tabs tablet Commonly known as: OSCAL Take 600 mg of elemental calcium by mouth daily with breakfast.   Eliquis 5 MG Tabs tablet Generic drug: apixaban Take 1 tablet by mouth twice daily   EPINEPHrine 0.3 mg/0.3 mL Soaj injection Commonly known as: EPI-PEN Inject 0.3 mg into the muscle as needed for anaphylaxis.   hydrocortisone 1 % lotion Apply 1 Application topically 2 (two) times daily.   ketoconazole 2 % cream Commonly known as: NIZORAL Apply 1 Application topically daily as needed for irritation.   losartan 25 MG tablet Commonly known as: COZAAR Take 1 tablet by mouth once daily   metoprolol succinate 50 MG 24 hr tablet Commonly known as: TOPROL-XL TAKE 1 & 1/2 (ONE & ONE-HALF) TABLETS BY MOUTH ONCE DAILY   multivitamin with minerals Tabs tablet Take 1 tablet by mouth daily.   OSTEO BI-FLEX JOINT SHIELD PO Take 2 tablets by mouth daily.  rosuvastatin 5 MG tablet Commonly known as: CRESTOR Take 1 tablet by mouth daily   sildenafil 100 MG tablet Commonly known as: VIAGRA TAKE 1 TABLET BY MOUTH ONCE DAILY AS NEEDED FOR ERECTILE DYSFUNCTION   tadalafil 20 MG tablet Commonly known as: CIALIS Take 1 tablet (20 mg total) by mouth as needed for erectile dysfunction.   terazosin 2 MG capsule Commonly known as: HYTRIN Take 1 capsule (2 mg total) by mouth at bedtime.   triamcinolone cream 0.1 % Commonly known as: KENALOG Apply 1 Application topically 2 (two) times daily.   Vitamin D3 50 MCG (2000 UT) Tabs Take 6,000 Units by mouth  daily.        Allergies: No Known Allergies  Family History: Family History  Problem Relation Age of Onset   Diabetes Mother    Hypertension Mother    Early death Father    Arthritis Maternal Grandmother    Hypertension Maternal Grandfather    Hypertension Paternal Grandmother    Cancer Paternal Grandfather        lung    Social History:  reports that he has never smoked. He has never used smokeless tobacco. He reports current alcohol use. He reports that he does not use drugs.  ROS: All other review of systems were reviewed and are negative except what is noted above in HPI  Physical Exam: BP 131/70   Pulse 76   Constitutional:  Alert and oriented, No acute distress. HEENT: [place] AT, moist mucus membranes.  Trachea midline, no masses. Cardiovascular: No clubbing, cyanosis, or edema. Respiratory: Normal respiratory effort, no increased work of breathing. GI: Abdomen is soft, nontender, nondistended, no abdominal masses GU: No CVA tenderness.  Lymph: No cervical or inguinal lymphadenopathy. Skin: No rashes, bruises or suspicious lesions. Neurologic: Grossly intact, no focal deficits, moving all 4 extremities. Psychiatric: Normal mood and affect.  Laboratory Data: Lab Results  Component Value Date   WBC 5.9 10/15/2023   HGB 14.2 10/15/2023   HCT 40.9 10/15/2023   MCV 94 10/15/2023   PLT 207 10/15/2023    Lab Results  Component Value Date   CREATININE 0.75 (L) 10/15/2023    No results found for: "PSA"  No results found for: "TESTOSTERONE"  No results found for: "HGBA1C"  Urinalysis    Component Value Date/Time   APPEARANCEUR Clear 04/27/2023 1052   GLUCOSEU Negative 04/27/2023 1052   BILIRUBINUR Negative 04/27/2023 1052   PROTEINUR Negative 04/27/2023 1052   NITRITE Negative 04/27/2023 1052   LEUKOCYTESUR Negative 04/27/2023 1052    Lab Results  Component Value Date   LABMICR Comment 04/27/2023    Pertinent Imaging:  No results found for this  or any previous visit.  No results found for this or any previous visit.  No results found for this or any previous visit.  No results found for this or any previous visit.  No results found for this or any previous visit.  No results found for this or any previous visit.  No results found for this or any previous visit.  No results found for this or any previous visit.   Assessment & Plan:    1. Prostate cancer (HCC) (Primary) Followup 6 months with PSA - Urinalysis, Routine w reflex microscopic  2. Weak urinary stream Continue terazosin 2mg  daily  3. Benign prostatic hyperplasia with urinary obstruction Continue terazosin 2mf daily  4. Erectile dysfunction due to arterial insufficiency Continue sildenafil 100mg  prn   No follow-ups on file.  Wilkie Aye, MD  [facility] [place]

## 2023-11-23 ENCOUNTER — Other Ambulatory Visit: Payer: Self-pay | Admitting: Family Medicine

## 2023-11-23 DIAGNOSIS — E782 Mixed hyperlipidemia: Secondary | ICD-10-CM

## 2023-11-23 DIAGNOSIS — Z0000 Encounter for general adult medical examination without abnormal findings: Secondary | ICD-10-CM

## 2023-11-29 ENCOUNTER — Encounter: Payer: Self-pay | Admitting: Family Medicine

## 2023-11-29 ENCOUNTER — Ambulatory Visit: Admitting: Family Medicine

## 2023-11-29 VITALS — BP 126/67 | HR 63 | Temp 97.8°F | Ht 68.0 in | Wt 171.0 lb

## 2023-11-29 DIAGNOSIS — L209 Atopic dermatitis, unspecified: Secondary | ICD-10-CM | POA: Diagnosis not present

## 2023-11-29 MED ORDER — TRIAMCINOLONE ACETONIDE 0.1 % EX CREA: 1.0000 | TOPICAL_CREAM | Freq: Two times a day (BID) | CUTANEOUS | 3 refills | Status: AC

## 2023-11-29 NOTE — Progress Notes (Signed)
 BP 126/67   Pulse 63   Temp 97.8 F (36.6 C)   Ht 5\' 8"  (1.727 m)   Wt 171 lb (77.6 kg)   SpO2 97%   BMI 26.00 kg/m    Subjective:   Patient ID: David Schwartz, male    DOB: 08-05-1946, 78 y.o.   MRN: [id number]  HPI: David Schwartz is a 78 y.o. male presenting on 11/29/2023 for Rash   HPI Rash, abdominal Patient comes in today with lower abdominal rash that he noticed over the past 3 or 4 days.  He says it itches small bumps on both sides of his abdomen and now they are starting to come towards the center of his abdomen.  He denies any having them anywhere else besides on the abdomen.  He was diagnosed with alpha gal last year and does have beef every now and then and did have some recently but he has not had a reaction like this before when he has had it every now and then.  Relevant past medical, surgical, family and social history reviewed and updated as indicated. Interim medical history since our last visit reviewed. Allergies and medications reviewed and updated.  Review of Systems  Constitutional:  Negative for chills and fever.  Respiratory:  Negative for shortness of breath and wheezing.   Cardiovascular:  Negative for chest pain and leg swelling.  Musculoskeletal:  Negative for back pain and gait problem.  Skin:  Positive for rash. Negative for color change and wound.  All other systems reviewed and are negative.   Per HPI unless specifically indicated above   Allergies as of 11/29/2023   No Known Allergies      Medication List        Accurate as of November 29, 2023 10:05 AM. If you have any questions, ask your nurse or doctor.          acetaminophen 500 MG tablet Commonly known as: TYLENOL Take 500 mg by mouth every 6 (six) hours as needed for moderate pain.   alendronate 70 MG tablet Commonly known as: FOSAMAX TAKE 1 TABLET BY MOUTH WEEKLY  WITH 8 OZ OF PLAIN WATER 30  MINUTES BEFORE FIRST FOOD, DRINK OR MEDS. STAY UPRIGHT FOR 30  MINS   calcium  carbonate 1500 (600 Ca) MG Tabs tablet Commonly known as: OSCAL Take 600 mg of elemental calcium by mouth daily with breakfast.   Eliquis 5 MG Tabs tablet Generic drug: apixaban Take 1 tablet by mouth twice daily   EPINEPHrine 0.3 mg/0.3 mL Soaj injection Commonly known as: EPI-PEN Inject 0.3 mg into the muscle as needed for anaphylaxis.   hydrocortisone 1 % lotion Apply 1 Application topically 2 (two) times daily.   ketoconazole 2 % cream Commonly known as: NIZORAL Apply 1 Application topically daily as needed for irritation.   losartan 25 MG tablet Commonly known as: COZAAR Take 1 tablet by mouth once daily   metoprolol succinate 50 MG 24 hr tablet Commonly known as: TOPROL-XL TAKE 1 & 1/2 (ONE & ONE-HALF) TABLETS BY MOUTH ONCE DAILY   multivitamin with minerals Tabs tablet Take 1 tablet by mouth daily.   OSTEO BI-FLEX JOINT SHIELD PO Take 2 tablets by mouth daily.   rosuvastatin 5 MG tablet Commonly known as: CRESTOR Take 1 tablet by mouth once daily   sildenafil 100 MG tablet Commonly known as: VIAGRA Take 1 tablet (100 mg total) by mouth daily as needed for erectile dysfunction.   tadalafil 20 MG  tablet Commonly known as: CIALIS Take 1 tablet (20 mg total) by mouth as needed for erectile dysfunction.   terazosin 2 MG capsule Commonly known as: HYTRIN Take 1 capsule (2 mg total) by mouth at bedtime.   triamcinolone cream 0.1 % Commonly known as: KENALOG Apply 1 Application topically 2 (two) times daily.   Vitamin D3 50 MCG (2000 UT) Tabs Take 6,000 Units by mouth daily.         Objective:   BP 126/67   Pulse 63   Temp 97.8 F (36.6 C)   Ht 5\' 8"  (1.727 m)   Wt 171 lb (77.6 kg)   SpO2 97%   BMI 26.00 kg/m   Wt Readings from Last 3 Encounters:  11/29/23 171 lb (77.6 kg)  10/15/23 172 lb (78 kg)  05/21/23 176 lb 9.6 oz (80.1 kg)    Physical Exam Vitals and nursing note reviewed.  Constitutional:      Appearance: Normal appearance.   Skin:    Findings: Rash (Fine pink papular rash that blanches on abdomen, the majority are on both lateral abdomen but there are some centrally as well) present.  Neurological:     Mental Status: He is alert.       Assessment & Plan:   Problem List Items Addressed This Visit   None Visit Diagnoses       Atopic dermatitis, unspecified type    -  Primary   Relevant Medications   triamcinolone cream (KENALOG) 0.1 %   Other Relevant Orders   CBC with Differential/Platelet   CMP14+EGFR   Alpha-Gal Panel   Lyme Disease Serology w/Reflex   Spotted Fever Group Antibodies       Concern for possible reaction to something, with a history of alpha gal will do retesting for that.  Gave triamcinolone cream that he can use in the meantime. Follow up plan: Return if symptoms worsen or fail to improve.  Counseling provided for all of the vaccine components Orders Placed This Encounter  Procedures   CBC with Differential/Platelet   CMP14+EGFR   Alpha-Gal Panel   Lyme Disease Serology w/Reflex   Spotted Fever Group Antibodies    Arville Care, MD [facility] 11/29/2023, 10:05 AM

## 2023-12-03 ENCOUNTER — Encounter: Payer: Self-pay | Admitting: Family Medicine

## 2023-12-07 LAB — CBC WITH DIFFERENTIAL/PLATELET
Basophils Absolute: 0.1 10*3/uL (ref 0.0–0.2)
Basos: 1 %
EOS (ABSOLUTE): 0.4 10*3/uL (ref 0.0–0.4)
Eos: 6 %
Hematocrit: 42.4 % (ref 37.5–51.0)
Hemoglobin: 14.7 g/dL (ref 13.0–17.7)
Immature Grans (Abs): 0 10*3/uL (ref 0.0–0.1)
Immature Granulocytes: 0 %
Lymphocytes Absolute: 1.3 10*3/uL (ref 0.7–3.1)
Lymphs: 23 %
MCH: 32.5 pg (ref 26.6–33.0)
MCHC: 34.7 g/dL (ref 31.5–35.7)
MCV: 94 fL (ref 79–97)
Monocytes Absolute: 0.5 10*3/uL (ref 0.1–0.9)
Monocytes: 9 %
Neutrophils Absolute: 3.4 10*3/uL (ref 1.4–7.0)
Neutrophils: 61 %
Platelets: 212 10*3/uL (ref 150–450)
RBC: 4.53 x10E6/uL (ref 4.14–5.80)
RDW: 12.3 % (ref 11.6–15.4)
WBC: 5.6 10*3/uL (ref 3.4–10.8)

## 2023-12-07 LAB — ALPHA-GAL PANEL
Allergen Lamb IgE: <0.1 kU/L
Beef IgE: 0.36 kU/L — AB
IgE (Immunoglobulin E), Serum: 35 [IU]/mL (ref 6–495)
O215-IgE Alpha-Gal: 0.72 kU/L — AB
Pork IgE: 0.1 kU/L — AB

## 2023-12-07 LAB — CMP14+EGFR
ALT: 13 IU/L (ref 0–44)
AST: 20 IU/L (ref 0–40)
Albumin: 4.6 g/dL (ref 3.8–4.8)
Alkaline Phosphatase: 52 IU/L (ref 44–121)
BUN/Creatinine Ratio: 12 (ref 10–24)
BUN: 10 mg/dL (ref 8–27)
Bilirubin Total: 0.7 mg/dL (ref 0.0–1.2)
CO2: 24 mmol/L (ref 20–29)
Calcium: 9.3 mg/dL (ref 8.6–10.2)
Chloride: 102 mmol/L (ref 96–106)
Creatinine, Ser: 0.86 mg/dL (ref 0.76–1.27)
Globulin, Total: 1.8 g/dL (ref 1.5–4.5)
Glucose: 92 mg/dL (ref 70–99)
Potassium: 5 mmol/L (ref 3.5–5.2)
Sodium: 140 mmol/L (ref 134–144)
Total Protein: 6.4 g/dL (ref 6.0–8.5)
eGFR: 89 mL/min/{1.73_m2} (ref >59)

## 2023-12-07 LAB — LYME DISEASE SEROLOGY W/REFLEX: Lyme Total Antibody EIA: NEGATIVE

## 2023-12-07 LAB — SPOTTED FEVER GROUP ANTIBODIES
Spotted Fever Group IgG: <1:64 {titer}
Spotted Fever Group IgM: <1:64 {titer}

## 2023-12-26 ENCOUNTER — Encounter: Payer: Self-pay | Admitting: Family Medicine

## 2023-12-26 ENCOUNTER — Ambulatory Visit: Admitting: Family Medicine

## 2023-12-26 VITALS — BP 138/75 | HR 68 | Ht 68.0 in | Wt 171.0 lb

## 2023-12-26 DIAGNOSIS — M5432 Sciatica, left side: Secondary | ICD-10-CM | POA: Diagnosis not present

## 2023-12-26 MED ORDER — METHYLPREDNISOLONE ACETATE 80 MG/ML IJ SUSP
80.0000 mg | Freq: Once | INTRAMUSCULAR | Status: AC
Start: 2023-12-26 — End: 2023-12-26
  Administered 2023-12-26: 80 mg via INTRAMUSCULAR

## 2023-12-26 NOTE — Progress Notes (Signed)
 BP 138/75   Pulse 68   Ht 5\' 8"  (1.727 m)   Wt 171 lb (77.6 kg)   SpO2 97%   BMI 26.00 kg/m    Subjective:   Patient ID: David Schwartz, male    DOB: 1946-07-26, 78 y.o.   MRN: [id number]  HPI: David Schwartz is a 78 y.o. male presenting on 12/26/2023 for Leg Pain (Left. Present for weeks. Some days better than others)   HPI Left leg pain Patient is coming in today because he has been having about 2 to 3 weeks of pain that radiates down the outside of his left leg down his upper leg down to his lower leg as well.  He says he cannot recall anything specific that brought it on but when he is sitting more especially driving he feels like it gets worse.  He has been taking some Tylenol  from it and it does help and he feels like it is improving but is still there and just not going away.  He is especially has it more when he tries to go downstairs or right when he gets up and he feels stiff.  He does admit that he has some left knee arthritis that he knows about and has had a little irritation there but he feels like this is different because it is going up and down his leg.  Relevant past medical, surgical, family and social history reviewed and updated as indicated. Interim medical history since our last visit reviewed. Allergies and medications reviewed and updated.  Review of Systems  Constitutional:  Negative for chills and fever.  Respiratory:  Negative for shortness of breath and wheezing.   Cardiovascular:  Negative for chest pain and leg swelling.  Musculoskeletal:  Positive for arthralgias and myalgias. Negative for back pain and gait problem.  Skin:  Negative for rash.  Neurological:  Negative for weakness and numbness.  All other systems reviewed and are negative.   Per HPI unless specifically indicated above   Allergies as of 12/26/2023   No Known Allergies      Medication List        Accurate as of December 26, 2023 10:13 AM. If you have any questions, ask your  nurse or doctor.          acetaminophen  500 MG tablet Commonly known as: TYLENOL  Take 500 mg by mouth every 6 (six) hours as needed for moderate pain.   alendronate  70 MG tablet Commonly known as: FOSAMAX  TAKE 1 TABLET BY MOUTH WEEKLY  WITH 8 OZ OF PLAIN WATER 30  MINUTES BEFORE FIRST FOOD, DRINK OR MEDS. STAY UPRIGHT FOR 30  MINS   calcium  carbonate 1500 (600 Ca) MG Tabs tablet Commonly known as: OSCAL Take 600 mg of elemental calcium  by mouth daily with breakfast.   Eliquis  5 MG Tabs tablet Generic drug: apixaban  Take 1 tablet by mouth twice daily   EPINEPHrine  0.3 mg/0.3 mL Soaj injection Commonly known as: EPI-PEN Inject 0.3 mg into the muscle as needed for anaphylaxis.   hydrocortisone  1 % lotion Apply 1 Application topically 2 (two) times daily.   ketoconazole  2 % cream Commonly known as: NIZORAL  Apply 1 Application topically daily as needed for irritation.   losartan  25 MG tablet Commonly known as: COZAAR  Take 1 tablet by mouth once daily   metoprolol  succinate 50 MG 24 hr tablet Commonly known as: TOPROL -XL TAKE 1 & 1/2 (ONE & ONE-HALF) TABLETS BY MOUTH ONCE DAILY   multivitamin with minerals  Tabs tablet Take 1 tablet by mouth daily.   OSTEO BI-FLEX JOINT SHIELD PO Take 2 tablets by mouth daily.   rosuvastatin  5 MG tablet Commonly known as: CRESTOR  Take 1 tablet by mouth once daily   sildenafil  100 MG tablet Commonly known as: VIAGRA  Take 1 tablet (100 mg total) by mouth daily as needed for erectile dysfunction.   tadalafil  20 MG tablet Commonly known as: CIALIS  Take 1 tablet (20 mg total) by mouth as needed for erectile dysfunction.   terazosin  2 MG capsule Commonly known as: HYTRIN  Take 1 capsule (2 mg total) by mouth at bedtime.   triamcinolone  cream 0.1 % Commonly known as: KENALOG  Apply 1 Application topically 2 (two) times daily.   Vitamin D3 50 MCG (2000 UT) Tabs Take 6,000 Units by mouth daily.         Objective:   BP 138/75    Pulse 68   Ht 5\' 8"  (1.727 m)   Wt 171 lb (77.6 kg)   SpO2 97%   BMI 26.00 kg/m   Wt Readings from Last 3 Encounters:  12/26/23 171 lb (77.6 kg)  11/29/23 171 lb (77.6 kg)  10/15/23 172 lb (78 kg)    Physical Exam Vitals and nursing note reviewed.  Constitutional:      General: He is not in acute distress.    Appearance: He is well-developed. He is not diaphoretic.  Eyes:     General: No scleral icterus.    Conjunctiva/sclera: Conjunctivae normal.  Neck:     Thyroid : No thyromegaly.  Musculoskeletal:        General: Normal range of motion.     Cervical back: Neck supple.     Lumbar back: No swelling, edema, deformity, lacerations, spasms, tenderness or bony tenderness. Normal range of motion. Negative right straight leg raise test and negative left straight leg raise test.     Left hip: Normal. No deformity or tenderness. Normal range of motion.     Left upper leg: No swelling, deformity or tenderness.     Left knee: Normal. No bony tenderness. Normal range of motion. No tenderness. No LCL laxity, MCL laxity, ACL laxity or PCL laxity.Normal alignment, normal meniscus and normal patellar mobility.     Left lower leg: Normal. No swelling or tenderness. No edema.  Lymphadenopathy:     Cervical: No cervical adenopathy.  Skin:    General: Skin is warm and dry.     Findings: No rash.  Neurological:     Mental Status: He is alert and oriented to person, place, and time.     Coordination: Coordination normal.  Psychiatric:        Behavior: Behavior normal.       Assessment & Plan:   Problem List Items Addressed This Visit   None Visit Diagnoses       Left sciatic nerve pain    -  Primary   Relevant Medications   methylPREDNISolone  acetate (DEPO-MEDROL ) injection 80 mg (Start on 12/26/2023 10:15 AM)       Will give Depo-Medrol  IM to see if it helps calm down inflammation for a possible sciatic nerve.  If not improved from there then may consider doing imaging in the  future. Follow up plan: Return if symptoms worsen or fail to improve.  Counseling provided for all of the vaccine components No orders of the defined types were placed in this encounter.   Jolyne Needs, MD [facility] 12/26/2023, 10:13 AM

## 2024-01-08 ENCOUNTER — Ambulatory Visit: Attending: Cardiology | Admitting: Cardiology

## 2024-01-08 ENCOUNTER — Encounter: Payer: Self-pay | Admitting: Cardiology

## 2024-01-08 VITALS — BP 108/68 | HR 72 | Ht 68.0 in | Wt 170.0 lb

## 2024-01-08 DIAGNOSIS — I5032 Chronic diastolic (congestive) heart failure: Secondary | ICD-10-CM

## 2024-01-08 DIAGNOSIS — E782 Mixed hyperlipidemia: Secondary | ICD-10-CM | POA: Diagnosis not present

## 2024-01-08 DIAGNOSIS — I10 Essential (primary) hypertension: Secondary | ICD-10-CM | POA: Diagnosis not present

## 2024-01-08 DIAGNOSIS — D6869 Other thrombophilia: Secondary | ICD-10-CM

## 2024-01-08 DIAGNOSIS — I4891 Unspecified atrial fibrillation: Secondary | ICD-10-CM | POA: Diagnosis not present

## 2024-01-08 NOTE — Patient Instructions (Signed)
 Medication Instructions:  Continue all current medications.   Labwork: none  Testing/Procedures: none  Follow-Up: 6 months   Any Other Special Instructions Will Be Listed Below (If Applicable).   If you need a refill on your cardiac medications before your next appointment, please call your pharmacy.

## 2024-01-08 NOTE — Progress Notes (Signed)
 Clinical Summary David Schwartz is a 78 y.o.male seen today for follow up of the following medical problems.      1. Afib - new diagnosis noted by pcp during 12/25/19 visit - ekg from pcp reviewed, afib rate 108 - CHADS 2vasc score is at least 2 (age, HTN), started on eliquis  and toprol  25mg    - prior cardioversions, in general he does not feel well in afib even when rate controlled.   -afib ablation 01/2023  - EKG today shows NSR - no recent palpitations - no bleeding on eliquis .     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 Prostate cancer - followed by urology - considering radiation   4. Hyperlipidemia 04/2021 TC 170 TG 130 HDL 44 LDL 103  10/2021 TC 161 TG 096 HDL 54 LDL 94 04/2022 TC 168 TG 149 HDL 49 LDL 93 - he is on crestor    04/2023 TC 149 TG 105 HDL 49 LDL 81 - 10/2023 TC 045 TG 409 HDL 48 LDL 82     Grandson is at [employer], senior. His son lives [place] .  Past Medical History:  Diagnosis Date   Arthritis    Atrial fibrillation (HCC)    Cancer (HCC)    Complication of anesthesia    difficult waking up   Dysrhythmia    Hyperlipidemia    Hypertension      No Known Allergies   Current Outpatient Medications  Medication Sig Dispense Refill   acetaminophen  (TYLENOL ) 500 MG tablet Take 500 mg by mouth every 6 (six) hours as needed for moderate pain.     alendronate  (FOSAMAX ) 70 MG tablet TAKE 1 TABLET BY MOUTH WEEKLY  WITH 8 OZ OF PLAIN WATER 30  MINUTES BEFORE FIRST FOOD, DRINK OR MEDS. STAY UPRIGHT FOR 30  MINS 12 tablet 1   calcium  carbonate (OSCAL) 1500 (600 Ca) MG TABS tablet Take 600 mg of elemental calcium  by mouth daily with breakfast.     Cholecalciferol (VITAMIN D3) 50 MCG (2000 UT) TABS Take 6,000 Units by mouth daily.     ELIQUIS  5 MG TABS  tablet Take 1 tablet by mouth twice daily 60 tablet 6   EPINEPHrine  0.3 mg/0.3 mL IJ SOAJ injection Inject 0.3 mg into the muscle as needed for anaphylaxis. 1 each 1   hydrocortisone  1 % lotion Apply 1 Application topically 2 (two) times daily. 118 mL 0   ketoconazole  (NIZORAL ) 2 % cream Apply 1 Application topically daily as needed for irritation.     losartan  (COZAAR ) 25 MG tablet Take 1 tablet by mouth once daily 90 tablet 2   metoprolol  succinate (TOPROL -XL) 50 MG 24 hr tablet TAKE 1 & 1/2 (ONE & ONE-HALF) TABLETS BY MOUTH ONCE DAILY 135 tablet 3   Misc Natural Products (OSTEO BI-FLEX JOINT SHIELD PO) Take 2 tablets by mouth daily.     Multiple Vitamin (MULTIVITAMIN WITH MINERALS) TABS tablet Take 1 tablet by mouth daily.     rosuvastatin  (CRESTOR ) 5 MG tablet Take 1 tablet by mouth once daily 90 tablet 1   sildenafil  (VIAGRA ) 100 MG tablet Take 1 tablet (100 mg total) by mouth daily as needed for erectile dysfunction. 10 tablet 0   tadalafil  (CIALIS ) 20 MG tablet Take  1 tablet (20 mg total) by mouth as needed for erectile dysfunction. 20 tablet 5   terazosin  (HYTRIN ) 2 MG capsule Take 1 capsule (2 mg total) by mouth at bedtime. 90 capsule 3   triamcinolone  cream (KENALOG ) 0.1 % Apply 1 Application topically 2 (two) times daily. 80 g 3   No current facility-administered medications for this visit.     Past Surgical History:  Procedure Laterality Date   ATRIAL FIBRILLATION ABLATION N/A 01/04/2023   Procedure: ATRIAL FIBRILLATION ABLATION;  Surgeon: Efraim Grange, MD;  Location: [facility]IVE [facility];  Service: Cardiovascular;  Laterality: N/A;   CARDIOVERSION N/A 01/29/2020   Procedure: CARDIOVERSION;  Surgeon: Gerard Knight, MD;  Location: [facility];  Service: Cardiovascular;  Laterality: N/A;   CARDIOVERSION N/A 06/16/2022   Procedure: CARDIOVERSION;  Surgeon: Jann Melody, MD;  Location: [facility];  Service: Cardiovascular;  Laterality: N/A;   GOLD SEED IMPLANT N/A 06/23/2021    Procedure: GOLD SEED IMPLANT;  Surgeon: Marco Severs, MD;  Location: [facility];  Service: Urology;  Laterality: N/A;   HERNIA REPAIR     KNEE ARTHROSCOPY     SPACE OAR INSTILLATION N/A 06/23/2021   Procedure: SPACE OAR INSTILLATION;  Surgeon: Marco Severs, MD;  Location: [facility];  Service: Urology;  Laterality: N/A;   TEE WITHOUT CARDIOVERSION N/A 01/04/2023   Procedure: TRANSESOPHAGEAL ECHOCARDIOGRAM;  Surgeon: Mardell Shade, MD;  Location: [facility] INV[facility] CV [facility];  Service: Cardiovascular;  Laterality: N/A;     No Known Allergies    Family History  Problem Relation Age of Onset   Diabetes Mother    Hypertension Mother    Early death Father    Arthritis Maternal Grandmother    Hypertension Maternal Grandfather    Hypertension Paternal Grandmother    Cancer Paternal Grandfather        lung     Social History David Schwartz reports that he has never smoked. He has never used smokeless tobacco. David Schwartz reports current alcohol use.     Physical Examination Today's Vitals   01/08/24 1019  BP: 108/68  Pulse: 72  SpO2: 99%  Weight: 170 lb (77.1 kg)  Height: 5\' 8"  (1.727 m)   Body mass index is 25.85 kg/m.  Gen: resting comfortably, no acute distress HEENT: no scleral icterus, pupils equal round and reactive, no palptable cervical adenopathy,  CV: RRR, no m/rg, no jvd Resp: Clear to auscultation bilaterally GI: abdomen is soft, non-tender, non-distended, normal bowel sounds, no hepatosplenomegaly MSK: extremities are warm, no edema.  Skin: warm, no rash Neuro:  no focal deficits Psych: appropriate affect   Diagnostic Studies  01/2020 echo   IMPRESSIONS     1. Left ventricular ejection fraction, by estimation, is 35 to 40%. The  left ventricle has moderately decreased function. The left ventricle  demonstrates global hypokinesis. There is mild left ventricular  hypertrophy of the posterior segment. Left  ventricular diastolic parameters are  indeterminate.   2. Right ventricular systolic function is normal. The right ventricular  size is normal. There is normal pulmonary artery systolic pressure.   3. Left atrial size was moderately dilated.   4. Right atrial size was mildly dilated.   5. The mitral valve is degenerative. Mild to moderate mitral valve  regurgitation.   6. Tricuspid valve regurgitation is moderate.   7. The aortic valve is tricuspid. Aortic valve regurgitation is mild. No  aortic stenosis is present.   8. The inferior vena cava is normal  in size with greater than 50%  respiratory variability, suggesting right atrial pressure of 3 mmHg.    04/2020 echo   IMPRESSIONS     1. Left ventricular ejection fraction, by estimation, is 60 to 65%. The  left ventricle has normal function. The left ventricle has no regional  wall motion abnormalities. Left ventricular diastolic parameters are  consistent with Grade I diastolic  dysfunction (impaired relaxation).   2. Right ventricular systolic function is normal. The right ventricular  size is normal. There is normal pulmonary artery systolic pressure.   3. Left atrial size was moderately dilated.   4. The mitral valve is normal in structure. No evidence of mitral valve  regurgitation. No evidence of mitral stenosis.   5. The aortic valve is tricuspid. Aortic valve regurgitation is mild. No  aortic stenosis is present.   6. The inferior vena cava is normal in size with greater than 50%  respiratory variability, suggesting right atrial pressure of 3 mmHg.          Assessment and Plan   1. Afib/acquired thrombophilia -historically did not tolerate afib even when rate controlled.  - doing well after ablation 01/2023. - EKG today shows maintaining sinus rhythm - continue current meds including eliquis  for stroke prevention   2. 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 HTN  -bp  is well controlled, continue current meds   4. Hyperlipidemia -lipids are well controlled, he will continue current meds  F/u 6 months     Laurann Pollock, M.D.

## 2024-01-18 ENCOUNTER — Other Ambulatory Visit: Payer: Self-pay | Admitting: Cardiology

## 2024-01-22 NOTE — Progress Notes (Signed)
 This encounter was created in error - please disregard.  Per pt reschedule to 04/17/24 at3:10 due to pt is busy at the time of visit. --alia t/cma

## 2024-03-20 ENCOUNTER — Other Ambulatory Visit: Payer: Self-pay | Admitting: Urology

## 2024-03-24 ENCOUNTER — Other Ambulatory Visit: Payer: Self-pay

## 2024-03-24 MED ORDER — SILDENAFIL CITRATE 100 MG PO TABS: 100.0000 mg | ORAL_TABLET | Freq: Every day | ORAL | 0 refills | Status: AC | PRN

## 2024-04-14 ENCOUNTER — Ambulatory Visit: Payer: Medicare Other | Admitting: Family Medicine

## 2024-04-16 ENCOUNTER — Telehealth: Payer: Self-pay

## 2024-04-16 NOTE — Telephone Encounter (Signed)
 Pt called to confirm his upcoming appt pt given appt times and date and also was reminded his mychart should show him any upcoming appt for our office pt voiced his understanding

## 2024-04-17 ENCOUNTER — Encounter

## 2024-04-18 ENCOUNTER — Ambulatory Visit (INDEPENDENT_AMBULATORY_CARE_PROVIDER_SITE_OTHER): Admitting: Family Medicine

## 2024-04-18 ENCOUNTER — Encounter: Payer: Self-pay | Admitting: Family Medicine

## 2024-04-18 VITALS — BP 124/63 | HR 59 | Ht 68.0 in | Wt 167.0 lb

## 2024-04-18 DIAGNOSIS — I4819 Other persistent atrial fibrillation: Secondary | ICD-10-CM | POA: Diagnosis not present

## 2024-04-18 DIAGNOSIS — Z91018 Allergy to other foods: Secondary | ICD-10-CM

## 2024-04-18 DIAGNOSIS — M818 Other osteoporosis without current pathological fracture: Secondary | ICD-10-CM | POA: Diagnosis not present

## 2024-04-18 DIAGNOSIS — I10 Essential (primary) hypertension: Secondary | ICD-10-CM

## 2024-04-18 DIAGNOSIS — E782 Mixed hyperlipidemia: Secondary | ICD-10-CM

## 2024-04-18 LAB — CBC WITH DIFFERENTIAL/PLATELET
Basophils Absolute: 0.1 x10E3/uL (ref 0.0–0.2)
Basos: 1 %
EOS (ABSOLUTE): 0.3 x10E3/uL (ref 0.0–0.4)
Eos: 4 %
Hematocrit: 40.9 % (ref 37.5–51.0)
Hemoglobin: 14.1 g/dL (ref 13.0–17.7)
Immature Grans (Abs): 0 x10E3/uL (ref 0.0–0.1)
Immature Granulocytes: 0 %
Lymphocytes Absolute: 1.5 x10E3/uL (ref 0.7–3.1)
Lymphs: 22 %
MCH: 33.3 pg — ABNORMAL HIGH (ref 26.6–33.0)
MCHC: 34.5 g/dL (ref 31.5–35.7)
MCV: 97 fL (ref 79–97)
Monocytes Absolute: 0.6 x10E3/uL (ref 0.1–0.9)
Monocytes: 10 %
Neutrophils Absolute: 4.1 x10E3/uL (ref 1.4–7.0)
Neutrophils: 63 %
Platelets: 200 x10E3/uL (ref 150–450)
RBC: 4.24 x10E6/uL (ref 4.14–5.80)
RDW: 12.9 % (ref 11.6–15.4)
WBC: 6.5 x10E3/uL (ref 3.4–10.8)

## 2024-04-18 NOTE — Progress Notes (Signed)
 BP 124/63   Pulse (!) 59   Ht 5' 8 (1.727 m)   Wt 167 lb (75.8 kg)   SpO2 98%   BMI 25.39 kg/m    Subjective:   Patient ID: David Schwartz, male    DOB: May 24, 1946, 78 y.o.   MRN: [id number]  HPI: David Schwartz is a 78 y.o. male presenting on 04/18/2024 for Medical Management of Chronic Issues, Hyperlipidemia, and Hypertension   Discussed the use of AI scribe software for clinical note transcription with the patient, who gave verbal consent to proceed.  History of Present Illness   David Schwartz is a 78 year old male with hypertension and atrial fibrillation who presents for a recheck of his blood pressure.  His blood pressure is currently well-controlled with a recent reading of 124/63 mmHg. He continues to take losartan  and metoprolol  daily for blood pressure management.  He has a history of atrial fibrillation and mentions that his last visit with his cardiologist indicated that he was doing well. He underwent a procedure in the past that has helped keep his heart rhythm steady.  He is taking alendronate  once a week for bone density, along with calcium  and vitamin D supplements. He also takes a daily multivitamin and Crestor  for cholesterol management.  He mentions a family history of heart disease and emphasizes the importance of staying healthy and active.          Relevant past medical, surgical, family and social history reviewed and updated as indicated. Interim medical history since our last visit reviewed. Allergies and medications reviewed and updated.  Review of Systems  Constitutional:  Negative for chills and fever.  Respiratory:  Negative for shortness of breath and wheezing.   Cardiovascular:  Negative for chest pain and leg swelling.  Musculoskeletal:  Negative for back pain and gait problem.  Skin:  Negative for rash.  Neurological:  Negative for dizziness, weakness and light-headedness.  All other systems reviewed and are negative.   Per HPI  unless specifically indicated above   Allergies as of 04/18/2024   No Known Allergies      Medication List        Accurate as of April 18, 2024  8:30 AM. If you have any questions, ask your nurse or doctor.          acetaminophen  500 MG tablet Commonly known as: TYLENOL  Take 500 mg by mouth every 6 (six) hours as needed for moderate pain.   alendronate  70 MG tablet Commonly known as: FOSAMAX  TAKE 1 TABLET BY MOUTH WEEKLY  WITH 8 OZ OF PLAIN WATER 30  MINUTES BEFORE FIRST FOOD, DRINK OR MEDS. STAY UPRIGHT FOR 30  MINS   calcium  carbonate 1500 (600 Ca) MG Tabs tablet Commonly known as: OSCAL Take 600 mg of elemental calcium  by mouth daily with breakfast.   Eliquis  5 MG Tabs tablet Generic drug: apixaban  Take 1 tablet by mouth twice daily   EPINEPHrine  0.3 mg/0.3 mL Soaj injection Commonly known as: EPI-PEN Inject 0.3 mg into the muscle as needed for anaphylaxis.   hydrocortisone  1 % lotion Apply 1 Application topically 2 (two) times daily.   ketoconazole  2 % cream Commonly known as: NIZORAL  Apply 1 Application topically daily as needed for irritation.   losartan  25 MG tablet Commonly known as: COZAAR  Take 1 tablet by mouth once daily   metoprolol  succinate 50 MG 24 hr tablet Commonly known as: TOPROL -XL TAKE 1 & 1/2 (ONE & ONE-HALF) TABLETS BY MOUTH  ONCE DAILY   multivitamin with minerals Tabs tablet Take 1 tablet by mouth daily.   OSTEO BI-FLEX JOINT SHIELD PO Take 2 tablets by mouth daily.   rosuvastatin  5 MG tablet Commonly known as: CRESTOR  Take 1 tablet by mouth once daily   sildenafil  100 MG tablet Commonly known as: VIAGRA  Take 1 tablet (100 mg total) by mouth daily as needed for erectile dysfunction.   sildenafil  100 MG tablet Commonly known as: VIAGRA  TAKE 1 TABLET BY MOUTH ONCE DAILY AS NEEDED FOR ERECTILE DYSFUNCTION   tadalafil  20 MG tablet Commonly known as: CIALIS  Take 1 tablet (20 mg total) by mouth as needed for erectile  dysfunction.   terazosin  2 MG capsule Commonly known as: HYTRIN  Take 1 capsule (2 mg total) by mouth at bedtime.   triamcinolone  cream 0.1 % Commonly known as: KENALOG  Apply 1 Application topically 2 (two) times daily.   Vitamin D3 50 MCG (2000 UT) Tabs Take 6,000 Units by mouth daily.         Objective:   BP 124/63   Pulse (!) 59   Ht 5' 8 (1.727 m)   Wt 167 lb (75.8 kg)   SpO2 98%   BMI 25.39 kg/m   Wt Readings from Last 3 Encounters:  04/18/24 167 lb (75.8 kg)  01/08/24 170 lb (77.1 kg)  12/26/23 171 lb (77.6 kg)    Physical Exam Physical Exam   VITALS: BP- 124/63 NECK: Thyroid  normal on palpation. CHEST: Lungs clear to auscultation. CARDIOVASCULAR: Heart regular rate and rhythm, no AFib. EXTREMITIES: Minimal leg swelling. Pulses intact bilaterally.         Assessment & Plan:   Problem List Items Addressed This Visit       Cardiovascular and Mediastinum   Hypertension - Primary   Relevant Orders   CBC with Differential/Platelet   CMP14+EGFR   Persistent atrial fibrillation (HCC)   Relevant Orders   CBC with Differential/Platelet   CMP14+EGFR     Musculoskeletal and Integument   Osteoporosis   Relevant Orders   DG [facility] DEXA   CBC with Differential/Platelet   CMP14+EGFR     Other   Hyperlipemia   Relevant Orders   Lipid panel   Allergy to alpha-gal   Relevant Orders   Alpha-Gal Panel       Atrial fibrillation Atrial fibrillation well-managed post-procedure. No AFib detected. - Continue regular follow-ups with cardiologist Dr. Alvan.  Essential hypertension Blood pressure controlled at 124/63 mmHg with losartan  and metoprolol . - Continue losartan  and metoprolol  as prescribed.  Hyperlipidemia Hyperlipidemia managed with Crestor . - Continue Crestor  as prescribed.  Osteoporosis Osteoporosis managed with alendronate , calcium , and vitamin D. Discussed bone density test scheduling. - Continue alendronate  once weekly. - Continue  calcium  and vitamin D supplementation. - Schedule bone density test.          Follow up plan: Return in about 6 months (around 10/19/2024), or if symptoms worsen or fail to improve, for Please to schedule for bone density and 73-month appointment.  Counseling provided for all of the vaccine components Orders Placed This Encounter  Procedures   DG [facility] DEXA   CBC with Differential/Platelet   CMP14+EGFR   Lipid panel   Alpha-Gal Panel    Fonda Levins, MD [facility] 04/18/2024, 8:30 AM

## 2024-04-22 ENCOUNTER — Ambulatory Visit (INDEPENDENT_AMBULATORY_CARE_PROVIDER_SITE_OTHER)

## 2024-04-22 DIAGNOSIS — M810 Age-related osteoporosis without current pathological fracture: Secondary | ICD-10-CM | POA: Diagnosis not present

## 2024-04-22 LAB — CMP14+EGFR
ALT: 12 IU/L (ref 0–44)
AST: 21 IU/L (ref 0–40)
Albumin: 4.5 g/dL (ref 3.8–4.8)
Alkaline Phosphatase: 48 IU/L (ref 44–121)
BUN/Creatinine Ratio: 19 (ref 10–24)
BUN: 15 mg/dL (ref 8–27)
Bilirubin Total: 0.6 mg/dL (ref 0.0–1.2)
CO2: 22 mmol/L (ref 20–29)
Calcium: 9.3 mg/dL (ref 8.6–10.2)
Chloride: 102 mmol/L (ref 96–106)
Creatinine, Ser: 0.77 mg/dL (ref 0.76–1.27)
Globulin, Total: 2.1 g/dL (ref 1.5–4.5)
Glucose: 90 mg/dL (ref 70–99)
Potassium: 4.2 mmol/L (ref 3.5–5.2)
Sodium: 141 mmol/L (ref 134–144)
Total Protein: 6.6 g/dL (ref 6.0–8.5)
eGFR: 92 mL/min/1.73 (ref >59)

## 2024-04-22 LAB — LIPID PANEL
Chol/HDL Ratio: 2.9 ratio (ref 0.0–5.0)
Cholesterol, Total: 139 mg/dL (ref 100–199)
HDL: 48 mg/dL (ref >39)
LDL Chol Calc (NIH): 74 mg/dL (ref 0–99)
Triglycerides: 91 mg/dL (ref 0–149)
VLDL Cholesterol Cal: 17 mg/dL (ref 5–40)

## 2024-04-22 LAB — ALPHA-GAL PANEL
Allergen Lamb IgE: 0.15 kU/L — AB
Beef IgE: 0.49 kU/L — AB
IgE (Immunoglobulin E), Serum: 28 [IU]/mL (ref 6–495)
O215-IgE Alpha-Gal: 1.64 kU/L — AB
Pork IgE: 0.16 kU/L — AB

## 2024-04-23 ENCOUNTER — Ambulatory Visit: Payer: Self-pay | Admitting: Family Medicine

## 2024-04-23 ENCOUNTER — Other Ambulatory Visit: Payer: Self-pay | Admitting: Family Medicine

## 2024-04-23 DIAGNOSIS — M8589 Other specified disorders of bone density and structure, multiple sites: Secondary | ICD-10-CM | POA: Diagnosis not present

## 2024-04-23 NOTE — Telephone Encounter (Signed)
 Patient aware and verbalized understanding.

## 2024-05-07 ENCOUNTER — Other Ambulatory Visit

## 2024-05-07 DIAGNOSIS — C61 Malignant neoplasm of prostate: Secondary | ICD-10-CM

## 2024-05-08 ENCOUNTER — Other Ambulatory Visit

## 2024-05-08 LAB — PSA: Prostate Specific Ag, Serum: 0.3 ng/mL (ref 0.0–4.0)

## 2024-05-09 ENCOUNTER — Encounter: Payer: Self-pay | Admitting: Cardiovascular Disease

## 2024-05-09 ENCOUNTER — Ambulatory Visit: Payer: Medicare Other | Attending: Cardiovascular Disease | Admitting: Cardiovascular Disease

## 2024-05-09 VITALS — BP 120/68 | HR 64 | Ht 68.0 in | Wt 170.0 lb

## 2024-05-09 DIAGNOSIS — E782 Mixed hyperlipidemia: Secondary | ICD-10-CM | POA: Diagnosis not present

## 2024-05-09 DIAGNOSIS — I4891 Unspecified atrial fibrillation: Secondary | ICD-10-CM

## 2024-05-09 DIAGNOSIS — I10 Essential (primary) hypertension: Secondary | ICD-10-CM | POA: Diagnosis not present

## 2024-05-09 NOTE — Progress Notes (Signed)
  Electrophysiology Office Note:    Date:  05/09/2024   ID:  David Schwartz, DOB [birth year], MRN [id number]  PCP:  Dettinger, Fonda LABOR, MD   [facility] Providers Cardiologist:  David Carrier, MD Electrophysiologist:  David FORBES Furbish, MD     Referring MD: Dettinger, Fonda LABOR, MD   History of Present Illness:    David Schwartz is a 78 y.o. male with a hx listed below, significant for atrial fibrillation, HFrEF referred for arrhythmia management.     He was first diagnosed with atrial fibrillation in 2021. HR was elevated on presentation. He was managed with metoprolol  and eliquis , and eventually underwent DCCV. EF was 35-40% at the time of cardioversion but normalize with sinus rhythm and normal heart rate.  He had recurrence of AF in 2023 and underwent DCCV again in October after complaining of fatigue. He had recurrence of AF within a few weeks. He reports that he has increased fatigue when in AF and felt better after his cardioversions.  He underwent atrial fibrillation ablation on Jan 04, 2023.  The procedure was uncomplicated.  He reports that he has been feeling very good since the ablation.  No indication of AF recurrence.      He reports that he is doing very well today.  He has not had any fatigue or palpitations to suggest recurrence of atrial fibrillation.     EKGs/Labs/Other Studies Reviewed Today:     TTE: 19-Sep-2022 EF 50-55%, mildly dilated LA, normal mitral valve  TEE: 01/29/2023 EF 45-50%  EKG:  Last EKG results: today - AF I have reviewed all ECGs available in [facility]. AF, when present, is coarse, and there appears to be good atrial electrical amplitude.  Recent Labs: 04/18/2024: ALT 12; BUN 15; Creatinine, Ser 0.77; Hemoglobin 14.1; Platelets 200; Potassium 4.2; Sodium 141     Physical Exam:    VS:  BP 120/68   Pulse 64   Ht 5' 8 (1.727 m)   Wt 170 lb (77.1 kg)   SpO2 97%   BMI 25.85 kg/m     Wt Readings from Last 3 Encounters:  05/09/24  170 lb (77.1 kg)  04/18/24 167 lb (75.8 kg)  01/08/24 170 lb (77.1 kg)     GEN: Well nourished, well developed in no acute distress CARDIAC: Irregular rhythm, no murmurs, rubs, gallops RESPIRATORY:  Normal work of breathing MUSCULOSKELETAL: no edema    ASSESSMENT & PLAN:    Atrial fibrillation:  persistent, symptomatic with fatigue.   Status post atrial fibrillation ablation with posterior wall ablation on Jan 04, 2023 Doing well, maintaining sinus rhythm  Secondary hypercoagulable state CHA2DS2-VASc is 4 Continue Eliquis  5 mg I reviewed 04/18/2024 labs  CHFrEF:  likely due to tachycardia and AF. EF has recovered.        Medication Adjustments/Labs and Tests Ordered: Current medicines are reviewed at length with the patient today.  Concerns regarding medicines are outlined above.  Orders Placed This Encounter  Procedures   EKG 12-Lead   No orders of the defined types were placed in this encounter.    Signed, David FORBES Furbish, MD  05/09/2024 3:00 PM     [facility]

## 2024-05-09 NOTE — Patient Instructions (Signed)

## 2024-05-13 ENCOUNTER — Ambulatory Visit: Payer: Self-pay | Admitting: Urology

## 2024-05-13 ENCOUNTER — Other Ambulatory Visit: Payer: Self-pay | Admitting: Family Medicine

## 2024-05-13 DIAGNOSIS — Z0000 Encounter for general adult medical examination without abnormal findings: Secondary | ICD-10-CM

## 2024-05-13 DIAGNOSIS — E782 Mixed hyperlipidemia: Secondary | ICD-10-CM

## 2024-05-14 ENCOUNTER — Ambulatory Visit: Admitting: Urology

## 2024-05-14 ENCOUNTER — Encounter: Payer: Self-pay | Admitting: Urology

## 2024-05-14 VITALS — BP 115/64 | HR 64

## 2024-05-14 DIAGNOSIS — C61 Malignant neoplasm of prostate: Secondary | ICD-10-CM

## 2024-05-14 DIAGNOSIS — R3912 Poor urinary stream: Secondary | ICD-10-CM

## 2024-05-14 DIAGNOSIS — N138 Other obstructive and reflux uropathy: Secondary | ICD-10-CM | POA: Diagnosis not present

## 2024-05-14 DIAGNOSIS — N401 Enlarged prostate with lower urinary tract symptoms: Secondary | ICD-10-CM

## 2024-05-14 DIAGNOSIS — N5201 Erectile dysfunction due to arterial insufficiency: Secondary | ICD-10-CM

## 2024-05-14 LAB — URINALYSIS, ROUTINE W REFLEX MICROSCOPIC
Bilirubin, UA: NEGATIVE
Glucose, UA: NEGATIVE
Ketones, UA: NEGATIVE
Leukocytes,UA: NEGATIVE
Nitrite, UA: NEGATIVE
Protein,UA: NEGATIVE
RBC, UA: NEGATIVE
Specific Gravity, UA: 1.02 (ref 1.005–1.030)
Urobilinogen, Ur: 0.2 mg/dL (ref 0.2–1.0)
pH, UA: 7 (ref 5.0–7.5)

## 2024-05-14 MED ORDER — TADALAFIL 20 MG PO TABS: 20.0000 mg | ORAL_TABLET | Freq: Every day | ORAL | 5 refills | Status: AC | PRN

## 2024-05-14 MED ORDER — TERAZOSIN HCL 2 MG PO CAPS: 2.0000 mg | ORAL_CAPSULE | Freq: Every day | ORAL | 3 refills | Status: AC

## 2024-05-14 NOTE — Patient Instructions (Signed)

## 2024-05-14 NOTE — Progress Notes (Signed)
 05/14/2024 11:02 AM   David Schwartz [birth year] [id number]  Referring provider: Dettinger, Fonda LABOR, MD [address] [place],  [place] [address]  Followup prostate cancer and BPH   HPI: David Schwartz is a 78yo here for followup for prostate cancer and BPh with a Weak stream. PSA 0.3 which is stable. IPSS 6 QOL 1 on terazosin  2mg . Urine stream strong. No straining to urinate. Nocturia 2x. He notes sildenafil  gives him an erection 5-6 hours after taking the medication.    PMH: Past Medical History:  Diagnosis Date   Arthritis    Atrial fibrillation (HCC)    Cancer (HCC)    Complication of anesthesia    difficult waking up   Dysrhythmia    Hyperlipidemia    Hypertension     Surgical History: Past Surgical History:  Procedure Laterality Date   ATRIAL FIBRILLATION ABLATION N/A 01/04/2023   Procedure: ATRIAL FIBRILLATION ABLATION;  Surgeon: Nancey Eulas BRAVO, MD;  [facility];  Service: Cardiovascular;  Laterality: N/A;   CARDIOVERSION N/A 01/29/2020   Procedure: CARDIOVERSION;  Surgeon: Debera Jayson MATSU, MD;  Location: [facility];  Service: Cardiovascular;  Laterality: N/A;   CARDIOVERSION N/A 06/16/2022   Procedure: CARDIOVERSION;  Surgeon: Santo Stanly LABOR, MD;  Location: [facility];  Service: Cardiovascular;  Laterality: N/A;   GOLD SEED IMPLANT N/A 06/23/2021   Procedure: GOLD SEED IMPLANT;  Surgeon: Sherrilee Belvie LITTIE, MD;  Location: [facility];  Service: Urology;  Laterality: N/A;   HERNIA REPAIR     KNEE ARTHROSCOPY     SPACE OAR INSTILLATION N/A 06/23/2021   Procedure: SPACE OAR INSTILLATION;  Surgeon: Sherrilee Belvie LITTIE, MD;  Location: [facility];  Service: Urology;  Laterality: N/A;   TEE WITHOUT CARDIOVERSION N/A 01/04/2023   Procedure: TRANSESOPHAGEAL ECHOCARDIOGRAM;  Surgeon: Cherrie Toribio SAUNDERS, MD;  Location: [facility] INVASIVE CV LAB;  Service: Cardiovascular;  Laterality: N/A;    Home Medications:  Allergies as of 05/14/2024   No Known Allergies       Medication List        Accurate as of May 14, 2024 11:02 AM. If you have any questions, ask your nurse or doctor.          acetaminophen  500 MG tablet Commonly known as: TYLENOL  Take 500 mg by mouth every 6 (six) hours as needed for moderate pain.   alendronate  70 MG tablet Commonly known as: FOSAMAX  TAKE 1 TABLET BY MOUTH WEEKLY  WITH 8 OZ OF PLAIN WATER 30  MINUTES BEFORE FIRST FOOD, DRINK OR MEDS. STAY UPRIGHT FOR 30  MINS   calcium  carbonate 1500 (600 Ca) MG Tabs tablet Commonly known as: OSCAL Take 600 mg of elemental calcium  by mouth daily with breakfast.   Eliquis  5 MG Tabs tablet Generic drug: apixaban  Take 1 tablet by mouth twice daily   EPINEPHrine  0.3 mg/0.3 mL Soaj injection Commonly known as: EPI-PEN Inject 0.3 mg into the muscle as needed for anaphylaxis.   hydrocortisone  1 % lotion Apply 1 Application topically 2 (two) times daily.   ketoconazole  2 % cream Commonly known as: NIZORAL  Apply 1 Application topically daily as needed for irritation.   losartan  25 MG tablet Commonly known as: COZAAR  Take 1 tablet by mouth once daily   metoprolol  succinate 50 MG 24 hr tablet Commonly known as: TOPROL -XL TAKE 1 & 1/2 (ONE & ONE-HALF) TABLETS BY MOUTH ONCE DAILY   multivitamin with minerals Tabs tablet Take 1 tablet by mouth daily.   OSTEO BI-FLEX  JOINT SHIELD PO Take 2 tablets by mouth daily.   rosuvastatin  5 MG tablet Commonly known as: CRESTOR  Take 1 tablet by mouth once daily   sildenafil  100 MG tablet Commonly known as: VIAGRA  Take 1 tablet (100 mg total) by mouth daily as needed for erectile dysfunction.   sildenafil  100 MG tablet Commonly known as: VIAGRA  TAKE 1 TABLET BY MOUTH ONCE DAILY AS NEEDED FOR ERECTILE DYSFUNCTION   tadalafil  20 MG tablet Commonly known as: CIALIS  Take 1 tablet (20 mg total) by mouth as needed for erectile dysfunction.   terazosin  2 MG capsule Commonly known as: HYTRIN  Take 1 capsule (2 mg total) by  mouth at bedtime.   triamcinolone  cream 0.1 % Commonly known as: KENALOG  Apply 1 Application topically 2 (two) times daily.   Vitamin D3 50 MCG (2000 UT) Tabs Take 6,000 Units by mouth daily.        Allergies: No Known Allergies  Family History: Family History  Problem Relation Age of Onset   Diabetes Mother    Hypertension Mother    Early death Father    Arthritis Maternal Grandmother    Hypertension Maternal Grandfather    Hypertension Paternal Grandmother    Cancer Paternal Grandfather        lung    Social History:  reports that he has never smoked. He has never used smokeless tobacco. He reports current alcohol use. He reports that he does not use drugs.  ROS: All other review of systems were reviewed and are negative except what is noted above in HPI  Physical Exam: BP 115/64   Pulse 64   Constitutional:  Alert and oriented, No acute distress. HEENT: [place] AT, moist mucus membranes.  Trachea midline, no masses. Cardiovascular: No clubbing, cyanosis, or edema. Respiratory: Normal respiratory effort, no increased work of breathing. GI: Abdomen is soft, nontender, nondistended, no abdominal masses GU: No CVA tenderness.  Lymph: No cervical or inguinal lymphadenopathy. Skin: No rashes, bruises or suspicious lesions. Neurologic: Grossly intact, no focal deficits, moving all 4 extremities. Psychiatric: Normal mood and affect.  Laboratory Data: Lab Results  Component Value Date   WBC 6.5 04/18/2024   HGB 14.1 04/18/2024   HCT 40.9 04/18/2024   MCV 97 04/18/2024   PLT 200 04/18/2024    Lab Results  Component Value Date   CREATININE 0.77 04/18/2024    No results found for: PSA  No results found for: TESTOSTERONE   No results found for: HGBA1C  Urinalysis    Component Value Date/Time   APPEARANCEUR Clear 11/07/2023 1050   GLUCOSEU Negative 11/07/2023 1050   BILIRUBINUR Negative 11/07/2023 1050   PROTEINUR Negative 11/07/2023 1050   NITRITE  Negative 11/07/2023 1050   LEUKOCYTESUR Negative 11/07/2023 1050    Lab Results  Component Value Date   LABMICR Comment 11/07/2023    Pertinent Imaging:  No results found for this or any previous visit.  No results found for this or any previous visit.  No results found for this or any previous visit.  No results found for this or any previous visit.  No results found for this or any previous visit.  No results found for this or any previous visit.  No results found for this or any previous visit.  No results found for this or any previous visit.   Assessment & Plan:    1. Prostate cancer (HCC) (Primary) Followup 1 yea rwith PSA - Urinalysis, Routine w reflex microscopic  2. Weak urinary stream Continue terazosin  2mg   3. Benign prostatic hyperplasia with urinary obstruction Continue terazosin  2mg  daily  4. Erectile dysfunction -we will trial tadalafil  20mg  prn  No follow-ups on file.  Belvie Clara, MD  [facility] [place]

## 2024-06-19 ENCOUNTER — Other Ambulatory Visit: Payer: Self-pay | Admitting: Nurse Practitioner

## 2024-06-19 DIAGNOSIS — Z91018 Allergy to other foods: Secondary | ICD-10-CM

## 2024-06-19 DIAGNOSIS — Z91014 Allergy to mammalian meats: Secondary | ICD-10-CM

## 2024-06-25 ENCOUNTER — Ambulatory Visit

## 2024-06-25 VITALS — BP 115/64 | HR 64 | Ht 68.0 in | Wt 170.0 lb

## 2024-06-25 DIAGNOSIS — Z0000 Encounter for general adult medical examination without abnormal findings: Secondary | ICD-10-CM

## 2024-06-25 NOTE — Progress Notes (Signed)
 Subjective:   David Schwartz is a 78 y.o. who presents for a Medicare Wellness preventive visit.  As a reminder, Annual Wellness Visits don't include a physical exam, and some assessments may be limited, especially if this visit is performed virtually. We may recommend an in-person follow-up visit with your provider if needed.  Visit Complete: Virtual I connected with  Miquel LITTIE Norlander on 06/25/24 by a audio enabled telemedicine application and verified that I am speaking with the correct person using two identifiers.  Patient Location: Home  Provider Location: Home Office  I discussed the limitations of evaluation and management by telemedicine. The patient expressed understanding and agreed to proceed.  Vital Signs: Because this visit was a virtual/telehealth visit, some criteria may be missing or patient reported. Any vitals not documented were not able to be obtained and vitals that have been documented are patient reported.  VideoDeclined- This patient declined Librarian, academic. Therefore the visit was completed with audio only.  Persons Participating in Visit: Patient.  AWV Questionnaire: No: Patient Medicare AWV questionnaire was not completed prior to this visit.  Cardiac Risk Factors include: advanced age (>66men, >54 women)     Objective:    Today's Vitals   06/25/24 1347  BP: 115/64  Pulse: 64  Weight: 170 lb (77.1 kg)  Height: 5' 8 (1.727 m)   Body mass index is 25.85 kg/m.     06/25/2024    1:53 PM 06/25/2024    1:49 PM 01/19/2023    3:15 PM 01/04/2023    8:04 AM 06/14/2022    1:44 PM 01/05/2022    2:07 PM 06/23/2021    7:45 AM  Advanced Directives  Does Patient Have a Medical Advance Directive? Yes No Yes Yes Yes Yes No  Type of Estate agent of [employer] Power of [place];Living will Healthcare Power of [place];Living will Healthcare Power of [place];Living will Healthcare Power of  [place];Living will   Does patient want to make changes to medical advance directive?    No - Patient declined No - Patient declined    Copy of Healthcare Power of Attorney in Chart? No - copy requested  No - copy requested No - copy requested  No - copy requested   Would patient like information on creating a medical advance directive?       No - Patient declined    Current Medications (verified) Outpatient Encounter Medications as of 06/25/2024  Medication Sig   acetaminophen  (TYLENOL ) 500 MG tablet Take 500 mg by mouth every 6 (six) hours as needed for moderate pain.   alendronate  (FOSAMAX ) 70 MG tablet TAKE 1 TABLET BY MOUTH WEEKLY  WITH 8 OZ OF PLAIN WATER 30  MINUTES BEFORE FIRST FOOD, DRINK OR MEDS. STAY UPRIGHT FOR 30  MINS   calcium  carbonate (OSCAL) 1500 (600 Ca) MG TABS tablet Take 600 mg of elemental calcium  by mouth daily with breakfast.   Cholecalciferol (VITAMIN D3) 50 MCG (2000 UT) TABS Take 6,000 Units by mouth daily.   ELIQUIS  5 MG TABS tablet Take 1 tablet by mouth twice daily   EPINEPHrine  0.3 mg/0.3 mL IJ SOAJ injection INJECT CONTENTS OF 1 PEN AS NEEDED FOR ALLERGIC REACTION   hydrocortisone  1 % lotion Apply 1 Application topically 2 (two) times daily.   ketoconazole  (NIZORAL ) 2 % cream Apply 1 Application topically daily as needed for irritation.   losartan  (COZAAR ) 25 MG tablet Take 1 tablet by mouth once daily   metoprolol   succinate (TOPROL -XL) 50 MG 24 hr tablet TAKE 1 & 1/2 (ONE & ONE-HALF) TABLETS BY MOUTH ONCE DAILY   Multiple Vitamin (MULTIVITAMIN WITH MINERALS) TABS tablet Take 1 tablet by mouth daily.   rosuvastatin  (CRESTOR ) 5 MG tablet Take 1 tablet by mouth once daily   sildenafil  (VIAGRA ) 100 MG tablet Take 1 tablet (100 mg total) by mouth daily as needed for erectile dysfunction.   tadalafil  (CIALIS ) 20 MG tablet Take 1 tablet (20 mg total) by mouth daily as needed.   terazosin  (HYTRIN ) 2 MG capsule Take 1 capsule (2 mg total) by mouth at bedtime.    triamcinolone  cream (KENALOG ) 0.1 % Apply 1 Application topically 2 (two) times daily.   Misc Natural Products (OSTEO BI-FLEX JOINT SHIELD PO) Take 2 tablets by mouth daily. (Patient not taking: Reported on 06/25/2024)   sildenafil  (VIAGRA ) 100 MG tablet TAKE 1 TABLET BY MOUTH ONCE DAILY AS NEEDED FOR ERECTILE DYSFUNCTION (Patient not taking: Reported on 06/25/2024)   No facility-administered encounter medications on file as of 06/25/2024.    Allergies (verified) Patient has no known allergies.   History: Past Medical History:  Diagnosis Date   Arthritis    Atrial fibrillation (HCC)    Cancer (HCC)    Complication of anesthesia    difficult waking up   Dysrhythmia    Hyperlipidemia    Hypertension    Past Surgical History:  Procedure Laterality Date   ATRIAL FIBRILLATION ABLATION N/A 01/04/2023   Procedure: ATRIAL FIBRILLATION ABLATION;  Surgeon: Nancey Eulas BRAVO, MD;  [facility];  Service: Cardiovascular;  Laterality: N/A;   CARDIOVERSION N/A 01/29/2020   Procedure: CARDIOVERSION;  Surgeon: Debera Jayson MATSU, MD;  Location: [facility];  Service: Cardiovascular;  Laterality: N/A;   CARDIOVERSION N/A 06/16/2022   Procedure: CARDIOVERSION;  Surgeon: Santo Stanly LABOR, MD;  Location: [facility];  Service: Cardiovascular;  Laterality: N/A;   GOLD SEED IMPLANT N/A 06/23/2021   Procedure: GOLD SEED IMPLANT;  Surgeon: Sherrilee Belvie CROME, MD;  Location: [facility];  Service: Urology;  Laterality: N/A;   HERNIA REPAIR     KNEE ARTHROSCOPY     SPACE OAR INSTILLATION N/A 06/23/2021   Procedure: SPACE OAR INSTILLATION;  Surgeon: Sherrilee Belvie CROME, MD;  Location: [facility];  Service: Urology;  Laterality: N/A;   TEE WITHOUT CARDIOVERSION N/A 01/04/2023   Procedure: TRANSESOPHAGEAL ECHOCARDIOGRAM;  Surgeon: Cherrie Toribio SAUNDERS, MD;  Location: [facility] INVASIVE CV LAB;  Service: Cardiovascular;  Laterality: N/A;   Family History  Problem Relation Age of Onset   Diabetes Mother     Hypertension Mother    Early death Father    Arthritis Maternal Grandmother    Hypertension Maternal Grandfather    Hypertension Paternal Grandmother    Cancer Paternal Grandfather        lung   Social History   Socioeconomic History   Marital status: Married    Spouse name: Nena   Number of children: Not on file   Years of education: 12   Highest education level: 12th grade  Occupational History   Occupation: Retired  Tobacco Use   Smoking status: Never   Smokeless tobacco: Never  Vaping Use   Vaping status: Never Used  Substance and Sexual Activity   Alcohol use: Yes    Comment: occ   Drug use: Never   Sexual activity: Not on file  Other Topics Concern   Not on file  Social History Narrative   Living with wife - son lives  nearby   Social Drivers of Health   Financial Resource Strain: Low Risk  (06/25/2024)   Overall Financial Resource Strain (CARDIA)    Difficulty of Paying Living Expenses: Not hard at all  Food Insecurity: No Food Insecurity (06/25/2024)   Hunger Vital Sign    Worried About Running Out of Food in the Last Year: Never true    Ran Out of Food in the Last Year: Never true  Transportation Needs: No Transportation Needs (06/25/2024)   PRAPARE - Administrator, Civil Service (Medical): No    Lack of Transportation (Non-Medical): No  Physical Activity: Insufficiently Active (06/25/2024)   Exercise Vital Sign    Days of Exercise per Week: 3 days    Minutes of Exercise per Session: 30 min  Stress: No Stress Concern Present (06/25/2024)   [employer] of Occupational Health - Occupational Stress Questionnaire    Feeling of Stress: Not at all  Social Connections: Moderately Integrated (06/25/2024)   Social Connection and Isolation Panel    Frequency of Communication with Friends and Family: More than three times a week    Frequency of Social Gatherings with Friends and Family: More than three times a week    Attends Religious  Services: More than 4 times per year    Active Member of [employer] or Organizations: No    Attends Banker Meetings: Never    Marital Status: Married    Tobacco Counseling Counseling given: Yes    Clinical Intake:              No results found for: HGBA1C             Activities of Daily Living     06/25/2024    1:49 PM  In your present state of health, do you have any difficulty performing the following activities:  Hearing? 0  Vision? 0  Difficulty concentrating or making decisions? 0  Walking or climbing stairs? 0  Dressing or bathing? 0  Doing errands, shopping? 0  Preparing Food and eating ? N  Using the Toilet? N  In the past six months, have you accidently leaked urine? N  Do you have problems with loss of bowel control? N  Managing your Medications? N  Managing your Finances? N  Housekeeping or managing your Housekeeping? N    Patient Care Team: Dettinger, Fonda LABOR, MD as PCP - General (Family Medicine) Alvan, Dorn FALCON, MD as PCP - Cardiology (Cardiology) Mealor, Eulas BRAVO, MD as PCP - Electrophysiology (Cardiology) Billee Mliss BIRCH, [facility] (Pharmacist) Eda Baxter, MD as Referring Physician (Dermatology) Sherrilee Belvie CROME, MD as Consulting Physician (Urology) Alvan Dorn FALCON, MD as Consulting Physician (Cardiology) Vertell Pont, RN as Oncology Nurse Navigator  I have updated your Care Teams any recent Medical Services you may have received from other providers in the past year.     Assessment:   This is a routine wellness examination for David Schwartz.  Hearing/Vision screen Hearing Screening - Comments:: Pt denies hearing dif Vision Screening - Comments:: Pt wear glasses/pt goes to [place] in [place]/last ov 5/02025   Goals Addressed             This Visit's Progress    Exercise 3x per week (30 min per time)   On track    01/05/2022 AWV Goal: Exercise for General Health  Patient will verbalize understanding of the  benefits of increased physical activity: Exercising regularly is important. It will improve your overall fitness, flexibility, and endurance.  Regular exercise also will improve your overall health. It can help you control your weight, reduce stress, and improve your bone density. Over the next year, patient will increase physical activity as tolerated with a goal of at least 150 minutes of moderate physical activity per week.  You can tell that you are exercising at a moderate intensity if your heart starts beating faster and you start breathing faster but can still hold a conversation. Moderate-intensity exercise ideas include: Walking 1 mile (1.6 km) in about 15 minutes Biking Hiking Golfing Dancing Water aerobics Patient will verbalize understanding of everyday activities that increase physical activity by providing examples like the following: Yard work, such as: Insurance underwriter Gardening Washing windows or floors Patient will be able to explain general safety guidelines for exercising:  Before you start a new exercise program, talk with your health care provider. Do not exercise so much that you hurt yourself, feel dizzy, or get very short of breath. Wear comfortable clothes and wear shoes with good support. Drink plenty of water while you exercise to prevent dehydration or heat stroke. Work out until your breathing and your heartbeat get faster.        Depression Screen     06/25/2024    1:54 PM 04/18/2024    8:18 AM 12/26/2023    9:54 AM 11/29/2023    9:53 AM 10/15/2023   11:03 AM 04/12/2023    9:50 AM 03/21/2023    3:32 PM  PHQ 2/9 Scores  PHQ - 2 Score 0 0 0 0 0 0 0  PHQ- 9 Score  0    0 0    Fall Risk     06/25/2024    1:48 PM 04/18/2024    8:18 AM 12/26/2023    9:53 AM 11/29/2023    9:53 AM 10/15/2023   11:03 AM  Fall Risk   Falls in the past year? 0 0 0 0 0  Number falls in past  yr: 0 0 0    Injury with Fall? 0 0 0    Risk for fall due to : No Fall Risks No Fall Risks Orthopedic patient    Follow up Falls evaluation completed Falls evaluation completed Falls evaluation completed      MEDICARE RISK AT HOME:  Medicare Risk at Home Any stairs in or around the home?: Yes If so, are there any without handrails?: Yes Home free of loose throw rugs in walkways, pet beds, electrical cords, etc?: Yes Adequate lighting in your home to reduce risk of falls?: Yes Life alert?: No Use of a cane, walker or w/c?: No Grab bars in the bathroom?: Yes Shower chair or bench in shower?: No Elevated toilet seat or a handicapped toilet?: Yes  TIMED UP AND GO:  Was the test performed?  no  Cognitive Function: 6CIT completed        06/25/2024    1:55 PM 01/19/2023    3:16 PM 01/05/2022    2:30 PM 01/02/2020    2:01 PM  6CIT Screen  What Year? 0 points 0 points 0 points 0 points  What month? 0 points 0 points 0 points 0 points  What time? 0 points 0 points 0 points 0 points  Count back from 20 0 points 0 points 0 points 0 points  Months in reverse 0 points 0 points 0 points 0 points  Repeat phrase 4 points 0 points 2 points  4 points  Total Score 4 points 0 points 2 points 4 points    Immunizations Immunization History  Administered Date(s) Administered   Fluad Quad(high Dose 65+) 06/06/2019   INFLUENZA, HIGH DOSE SEASONAL PF 05/30/2018   Influenza-Unspecified 07/27/2020, 07/05/2021, 06/08/2022   Moderna Sars-Covid-2 Vaccination 10/18/2019, 11/15/2019, 10/29/2021   Pneumococcal Conjugate-13 04/08/2020   Pneumococcal Polysaccharide-23 08/10/2011   Pneumococcal-Unspecified 02/23/2014   Tdap 03/31/2016   Zoster Recombinant(Shingrix) 04/13/2020, 06/09/2020   Zoster, Live 06/10/2011    Screening Tests Health Maintenance  Topic Date Due   Influenza Vaccine  04/04/2024   COVID-19 Vaccine (4 - 2025-26 season) 05/05/2024   Medicare Annual Wellness (AWV)  06/25/2025    DTaP/Tdap/Td (2 - Td or Tdap) 03/31/2026   DEXA SCAN  04/23/2026   Pneumococcal Vaccine: 50+ Years  Completed   Hepatitis C Screening  Completed   Zoster Vaccines- Shingrix  Completed   Meningococcal B Vaccine  Aged Out   Colonoscopy  Discontinued    Health Maintenance Items Addressed: Vaccines Due: flu/covid  Additional Screening:  Vision Screening: Recommended annual ophthalmology exams for early detection of glaucoma and other disorders of the eye. Is the patient up to date with their annual eye exam?  No  Who is the provider or what is the name of the [facility] in which the [facility] [place] [place]  Dental Screening: Recommended annual dental exams for proper oral hygiene  Community Resource Referral / Chronic Care Management: CRR required this visit?  No   CCM required this visit?  No   Plan:    I have personally reviewed and noted the following in the patient's chart:   Medical and social history Use of alcohol, tobacco or illicit drugs  Current medications and supplements including opioid prescriptions. Patient is not currently taking opioid prescriptions. Functional ability and status Nutritional status Physical activity Advanced directives List of other physicians Hospitalizations, surgeries, and ER visits in previous 12 months Vitals Screenings to include cognitive, depression, and falls Referrals and appointments  In addition, I have reviewed and discussed with patient certain preventive protocols, quality metrics, and best practice recommendations. A written personalized care plan for preventive services as well as general preventive health recommendations were provided to patient.   Ozie Ned, CMA   06/25/2024   After Visit Summary: (MyChart) Due to this being a telephonic visit, the after visit summary with patients personalized plan was offered to patient via MyChart   Notes: Nothing significant to report at this time.

## 2024-06-25 NOTE — Patient Instructions (Signed)
 [name],  Thank you for taking the time for your Medicare Wellness Visit. I appreciate your continued commitment to your health goals. Please review the care plan we discussed, and feel free to reach out if I can assist you further.  Medicare recommends these wellness visits once per year to help you and your care team stay ahead of potential health issues. These visits are designed to focus on prevention, allowing your provider to concentrate on managing your acute and chronic conditions during your regular appointments.  Please note that Annual Wellness Visits do not include a physical exam. Some assessments may be limited, especially if the visit was conducted virtually. If needed, we may recommend a separate in-person follow-up with your provider.  Ongoing Care Seeing your primary care provider every 3 to 6 months helps us  monitor your health and provide consistent, personalized care.   Referrals If a referral was made during today's visit and you haven't received any updates within two weeks, please contact the referred provider directly to check on the status.  Recommended Screenings:  Health Maintenance  Topic Date Due   Medicare Annual Wellness Visit  01/19/2024   Flu Shot  04/04/2024   COVID-19 Vaccine (4 - 2025-26 season) 05/05/2024   DTaP/Tdap/Td vaccine (2 - Td or Tdap) 03/31/2026   DEXA scan (bone density measurement)  04/23/2026   Pneumococcal Vaccine for age over 66  Completed   Hepatitis C Screening  Completed   Zoster (Shingles) Vaccine  Completed   Meningitis B Vaccine  Aged Out   Colon Cancer Screening  Discontinued       06/25/2024    1:53 PM  Advanced Directives  Does Patient Have a Medical Advance Directive? Yes  Type of Advance Directive Healthcare Power of Attorney  Copy of Healthcare Power of Attorney in Chart? No - copy requested   Advance Care Planning is important because it: Ensures you receive medical care that aligns with your values, goals,  and preferences. Provides guidance to your family and loved ones, reducing the emotional burden of decision-making during critical moments.  Vision: Annual vision screenings are recommended for early detection of glaucoma, cataracts, and diabetic retinopathy. These exams can also reveal signs of chronic conditions such as diabetes and high blood pressure.  Dental: Annual dental screenings help detect early signs of oral cancer, gum disease, and other conditions linked to overall health, including heart disease and diabetes.  Please see the attached documents for additional preventive care recommendations.

## 2024-09-04 ENCOUNTER — Other Ambulatory Visit: Payer: Self-pay | Admitting: Cardiology

## 2024-09-04 DIAGNOSIS — R000 Tachycardia, unspecified: Secondary | ICD-10-CM

## 2024-10-03 ENCOUNTER — Other Ambulatory Visit: Payer: Self-pay | Admitting: Family Medicine

## 2024-10-20 ENCOUNTER — Ambulatory Visit: Payer: Self-pay | Admitting: Family Medicine

## 2025-01-12 ENCOUNTER — Ambulatory Visit: Admitting: Cardiology

## 2025-05-15 ENCOUNTER — Ambulatory Visit: Admitting: Urology

## 2025-06-26 ENCOUNTER — Ambulatory Visit
